# Patient Record
Sex: Female | Born: 1935 | Race: White | Hispanic: No | Smoking: Never smoker
Health system: Southern US, Community
[De-identification: ages and names within clinical notes are randomized; demographics above are authoritative.]

## PROBLEM LIST (undated history)

## (undated) DIAGNOSIS — I10 Essential (primary) hypertension: Secondary | ICD-10-CM

## (undated) DIAGNOSIS — D496 Neoplasm of unspecified behavior of brain: Secondary | ICD-10-CM

## (undated) DIAGNOSIS — R42 Dizziness and giddiness: Secondary | ICD-10-CM

## (undated) DIAGNOSIS — I2510 Atherosclerotic heart disease of native coronary artery without angina pectoris: Secondary | ICD-10-CM

## (undated) DIAGNOSIS — R112 Nausea with vomiting, unspecified: Secondary | ICD-10-CM

## (undated) DIAGNOSIS — K219 Gastro-esophageal reflux disease without esophagitis: Secondary | ICD-10-CM

## (undated) DIAGNOSIS — E785 Hyperlipidemia, unspecified: Secondary | ICD-10-CM

## (undated) DIAGNOSIS — Z87442 Personal history of urinary calculi: Secondary | ICD-10-CM

## (undated) DIAGNOSIS — M1990 Unspecified osteoarthritis, unspecified site: Secondary | ICD-10-CM

## (undated) DIAGNOSIS — Z9889 Other specified postprocedural states: Secondary | ICD-10-CM

## (undated) DIAGNOSIS — I614 Nontraumatic intracerebral hemorrhage in cerebellum: Secondary | ICD-10-CM

## (undated) DIAGNOSIS — T753XXA Motion sickness, initial encounter: Secondary | ICD-10-CM

## (undated) DIAGNOSIS — Z8673 Personal history of transient ischemic attack (TIA), and cerebral infarction without residual deficits: Secondary | ICD-10-CM

## (undated) HISTORY — DX: Essential (primary) hypertension: I10

## (undated) HISTORY — PX: ESOPHAGOGASTRODUODENOSCOPY: SHX1529

## (undated) HISTORY — PX: BRAIN SURGERY: SHX531

## (undated) HISTORY — PX: ABDOMINAL HYSTERECTOMY: SHX81

## (undated) HISTORY — DX: Personal history of urinary calculi: Z87.442

## (undated) HISTORY — PX: COLONOSCOPY: SHX174

## (undated) HISTORY — DX: Atherosclerotic heart disease of native coronary artery without angina pectoris: I25.10

## (undated) HISTORY — PX: CATARACT EXTRACTION W/ INTRAOCULAR LENS  IMPLANT, BILATERAL: SHX1307

## (undated) HISTORY — DX: Hyperlipidemia, unspecified: E78.5

## (undated) HISTORY — PX: APPENDECTOMY: SHX54

---

## 2004-12-10 ENCOUNTER — Ambulatory Visit: Payer: Self-pay | Admitting: Internal Medicine

## 2005-12-30 ENCOUNTER — Ambulatory Visit: Payer: Self-pay | Admitting: Internal Medicine

## 2006-12-29 ENCOUNTER — Emergency Department: Payer: Self-pay | Admitting: Emergency Medicine

## 2006-12-29 ENCOUNTER — Other Ambulatory Visit: Payer: Self-pay

## 2007-01-12 ENCOUNTER — Ambulatory Visit: Payer: Self-pay | Admitting: Internal Medicine

## 2008-01-17 ENCOUNTER — Ambulatory Visit: Payer: Self-pay | Admitting: Internal Medicine

## 2008-09-26 ENCOUNTER — Ambulatory Visit: Payer: Self-pay | Admitting: Ophthalmology

## 2008-10-02 ENCOUNTER — Ambulatory Visit: Payer: Self-pay | Admitting: Ophthalmology

## 2008-10-23 ENCOUNTER — Ambulatory Visit: Payer: Self-pay | Admitting: Ophthalmology

## 2009-01-23 ENCOUNTER — Ambulatory Visit: Payer: Self-pay | Admitting: Internal Medicine

## 2010-01-28 ENCOUNTER — Ambulatory Visit: Payer: Self-pay | Admitting: Internal Medicine

## 2010-08-08 ENCOUNTER — Ambulatory Visit: Payer: Self-pay | Admitting: Unknown Physician Specialty

## 2010-09-13 ENCOUNTER — Ambulatory Visit: Payer: Self-pay | Admitting: Unknown Physician Specialty

## 2011-01-02 DIAGNOSIS — Z8673 Personal history of transient ischemic attack (TIA), and cerebral infarction without residual deficits: Secondary | ICD-10-CM

## 2011-01-02 DIAGNOSIS — I614 Nontraumatic intracerebral hemorrhage in cerebellum: Secondary | ICD-10-CM

## 2011-01-02 HISTORY — DX: Nontraumatic intracerebral hemorrhage in cerebellum: I61.4

## 2011-01-02 HISTORY — DX: Personal history of transient ischemic attack (TIA), and cerebral infarction without residual deficits: Z86.73

## 2011-01-27 ENCOUNTER — Emergency Department: Payer: Self-pay | Admitting: Internal Medicine

## 2011-02-27 ENCOUNTER — Ambulatory Visit: Payer: Self-pay | Admitting: Internal Medicine

## 2011-03-04 HISTORY — PX: OTHER SURGICAL HISTORY: SHX169

## 2012-03-01 ENCOUNTER — Ambulatory Visit: Payer: Self-pay | Admitting: Internal Medicine

## 2013-03-02 ENCOUNTER — Ambulatory Visit: Payer: Self-pay | Admitting: Internal Medicine

## 2014-02-17 ENCOUNTER — Emergency Department: Payer: Self-pay | Admitting: Emergency Medicine

## 2014-02-17 LAB — COMPREHENSIVE METABOLIC PANEL
ALBUMIN: 3.7 g/dL (ref 3.4–5.0)
ANION GAP: 8 (ref 7–16)
Alkaline Phosphatase: 85 U/L
BUN: 26 mg/dL — AB (ref 7–18)
Bilirubin,Total: 0.3 mg/dL (ref 0.2–1.0)
CHLORIDE: 108 mmol/L — AB (ref 98–107)
CREATININE: 1.18 mg/dL (ref 0.60–1.30)
Calcium, Total: 8.8 mg/dL (ref 8.5–10.1)
Co2: 27 mmol/L (ref 21–32)
EGFR (Non-African Amer.): 47 — ABNORMAL LOW
GFR CALC AF AMER: 57 — AB
Glucose: 154 mg/dL — ABNORMAL HIGH (ref 65–99)
Osmolality: 293 (ref 275–301)
Potassium: 3.3 mmol/L — ABNORMAL LOW (ref 3.5–5.1)
SGOT(AST): 23 U/L (ref 15–37)
SGPT (ALT): 20 U/L
Sodium: 143 mmol/L (ref 136–145)
TOTAL PROTEIN: 7.7 g/dL (ref 6.4–8.2)

## 2014-02-17 LAB — CBC
HCT: 39.2 % (ref 35.0–47.0)
HGB: 12.9 g/dL (ref 12.0–16.0)
MCH: 30.9 pg (ref 26.0–34.0)
MCHC: 32.8 g/dL (ref 32.0–36.0)
MCV: 94 fL (ref 80–100)
PLATELETS: 270 10*3/uL (ref 150–440)
RBC: 4.17 10*6/uL (ref 3.80–5.20)
RDW: 12.8 % (ref 11.5–14.5)
WBC: 9.4 10*3/uL (ref 3.6–11.0)

## 2014-02-17 LAB — URINALYSIS, COMPLETE
BILIRUBIN, UR: NEGATIVE
Glucose,UR: 150 mg/dL (ref 0–75)
Ketone: NEGATIVE
Leukocyte Esterase: NEGATIVE
Nitrite: NEGATIVE
PH: 5 (ref 4.5–8.0)
Protein: 30
Specific Gravity: 1.024 (ref 1.003–1.030)
Squamous Epithelial: 4
WBC UR: 4 /HPF (ref 0–5)

## 2014-03-06 ENCOUNTER — Ambulatory Visit: Payer: Self-pay | Admitting: Internal Medicine

## 2014-03-13 DIAGNOSIS — N200 Calculus of kidney: Secondary | ICD-10-CM | POA: Insufficient documentation

## 2014-06-23 DIAGNOSIS — K219 Gastro-esophageal reflux disease without esophagitis: Secondary | ICD-10-CM | POA: Insufficient documentation

## 2014-09-11 ENCOUNTER — Encounter: Payer: Self-pay | Admitting: Emergency Medicine

## 2014-09-11 ENCOUNTER — Emergency Department: Payer: Medicare Other

## 2014-09-11 ENCOUNTER — Emergency Department
Admission: EM | Admit: 2014-09-11 | Discharge: 2014-09-11 | Disposition: A | Discharge status: Home / Self Care | Payer: Medicare Other | Attending: Emergency Medicine | Admitting: Emergency Medicine

## 2014-09-11 DIAGNOSIS — R42 Dizziness and giddiness: Secondary | ICD-10-CM | POA: Diagnosis not present

## 2014-09-11 DIAGNOSIS — R202 Paresthesia of skin: Secondary | ICD-10-CM | POA: Diagnosis not present

## 2014-09-11 DIAGNOSIS — R079 Chest pain, unspecified: Secondary | ICD-10-CM | POA: Insufficient documentation

## 2014-09-11 HISTORY — DX: Essential (primary) hypertension: I10

## 2014-09-11 HISTORY — DX: Neoplasm of unspecified behavior of brain: D49.6

## 2014-09-11 LAB — BASIC METABOLIC PANEL
Anion gap: 11 (ref 5–15)
BUN: 15 mg/dL (ref 6–20)
CO2: 26 mmol/L (ref 22–32)
CREATININE: 0.83 mg/dL (ref 0.44–1.00)
Calcium: 9.8 mg/dL (ref 8.9–10.3)
Chloride: 106 mmol/L (ref 101–111)
GFR calc non Af Amer: >60 mL/min (ref >60)
Glucose, Bld: 102 mg/dL — ABNORMAL HIGH (ref 65–99)
POTASSIUM: 3.5 mmol/L (ref 3.5–5.1)
Sodium: 143 mmol/L (ref 135–145)

## 2014-09-11 LAB — TROPONIN I
Troponin I: <0.03 ng/mL (ref <0.031)
Troponin I: <0.03 ng/mL (ref <0.031)

## 2014-09-11 LAB — CBC
HCT: 40.6 % (ref 35.0–47.0)
Hemoglobin: 13.5 g/dL (ref 12.0–16.0)
MCH: 30.6 pg (ref 26.0–34.0)
MCHC: 33.1 g/dL (ref 32.0–36.0)
MCV: 92.2 fL (ref 80.0–100.0)
Platelets: 263 10*3/uL (ref 150–440)
RBC: 4.41 MIL/uL (ref 3.80–5.20)
RDW: 12.9 % (ref 11.5–14.5)
WBC: 6 10*3/uL (ref 3.6–11.0)

## 2014-09-11 NOTE — ED Provider Notes (Signed)
[facility] Emergency Department Provider Note   ____________________________________________  Time seen: 0815  I have reviewed the triage vital signs and the nursing notes.   HISTORY  Chief Complaint Chest Pain and Near Syncope   History limited by: Not Limited   HPI Karen Liu is a 81 y.o. female who presents to the emergency Department with complaints of chest tightness, dizziness, shortness of breath, lightheadedness and tingling. The patient states she has been having these symptoms for the past week. They have been intermittent. She has not noticed any obvious pattern however she states they might be worse after exerting herself. She states that this morning she experienced a extremely bad episode of the chest tightness, shortness of breath and tingling. The tingling is located in her head and arms. Denies any cough or fever.   No past medical history on file.  There are no active problems to display for this patient.   No past surgical history on file.  No current outpatient prescriptions on file.  Allergies Review of patient's allergies indicates not on file.  No family history on file.  Social History History  Substance Use Topics  . Smoking status: Not on file  . Smokeless tobacco: Not on file  . Alcohol Use: Not on file    Review of Systems  Constitutional: Negative for fever. Cardiovascular: Positive for chest tightness Respiratory: Positive for shortness of breath. Gastrointestinal: Negative for abdominal pain, vomiting and diarrhea. Genitourinary: Negative for dysuria. Musculoskeletal: Negative for back pain. Skin: Negative for rash. Neurological: Positive for lightheadedness, tingling  10-point ROS otherwise negative.  ____________________________________________   PHYSICAL EXAM:  VITAL SIGNS:  98.6 F (37 C)  72  18   149/70 mmHg  97 %   Constitutional: Alert and oriented. Well appearing and in no  distress. Eyes: Conjunctivae are normal. PERRL. Normal extraocular movements. ENT   Head: Normocephalic and atraumatic.   Nose: No congestion/rhinnorhea.   Mouth/Throat: Mucous membranes are moist.   Neck: No stridor. Hematological/Lymphatic/Immunilogical: No cervical lymphadenopathy. Cardiovascular: Normal rate, regular rhythm.  No murmurs, rubs, or gallops. Respiratory: Normal respiratory effort without tachypnea nor retractions. Breath sounds are clear and equal bilaterally. No wheezes/rales/rhonchi. Gastrointestinal: Soft and nontender. No distention. There is no CVA tenderness. Genitourinary: Deferred Musculoskeletal: Normal range of motion in all extremities. No joint effusions.  No lower extremity tenderness nor edema. Neurologic:  Normal speech and language. No gross focal neurologic deficits are appreciated. Speech is normal.  Skin:  Skin is warm, dry and intact. No rash noted. Psychiatric: Mood and affect are normal. Speech and behavior are normal. Patient exhibits appropriate insight and judgment.  ____________________________________________    LABS (pertinent positives/negatives)  Labs Reviewed  BASIC METABOLIC PANEL - Abnormal; Notable for the following:    Glucose, Bld 102 (*)    All other components within normal limits  TROPONIN I  CBC     ____________________________________________   EKG  I, Nance Pear, attending physician, personally viewed and interpreted this EKG  EKG Time: 0804 Rate: 74 Rhythm: normal sinus rhythm Axis: normal Intervals: normal QRS: normal ST changes: no st elevation  ____________________________________________    RADIOLOGY  CXR IMPRESSION: Mild hyperinflation may be voluntary or may reflect underlying reactive airway disease or COPD. There is no active cardiopulmonary disease.  CT Head IMPRESSION: Status post left occipital craniotomy.  Mild chronic atrophic and ischemic changes without  acute abnormality. ____________________________________________   PROCEDURES  Procedure(s) performed: None  Critical Care performed: No  ____________________________________________  INITIAL IMPRESSION / ASSESSMENT AND PLAN / ED COURSE  Pertinent labs & imaging results that were available during my care of the patient were reviewed by me and considered in my medical decision making (see chart for details).  Patient presented to the emergency Department with complaints of chest tightness, dizziness shortness of breath and tingling. CT head chest x-ray without concerning findings. Additionally blood work including 2 sets of troponin were negative. At this point had discussion with patient, felt the patient was safe for discharge home. Will discharge home with cardiology follow-up.   ____________________________________________   FINAL CLINICAL IMPRESSION(S) / ED DIAGNOSES  Final diagnoses:  Chest pain, unspecified chest pain type  Dizziness     Nance Pear, MD 09/11/14 1255

## 2014-09-11 NOTE — ED Notes (Signed)
Pt to ED via EMS transport from home with c/o chest tightness, dizziness, sob and lightheadedness, pt states she has had intermittent chest tightness for about 1 week and then yesterday she had dizziness and near syncopal episode, woke up this am with chest tightness and dizziness still present

## 2014-09-11 NOTE — Discharge Instructions (Signed)
Please seek medical attention for any high fevers, chest pain, shortness of breath, change in behavior, persistent vomiting, bloody stool or any other new or concerning symptoms.  Chest Pain (Nonspecific) It is often hard to give a specific diagnosis for the cause of chest pain. There is always a chance that your pain could be related to something serious, such as a heart attack or a blood clot in the lungs. You need to follow up with your health care provider for further evaluation. CAUSES   Heartburn.  Pneumonia or bronchitis.  Anxiety or stress.  Inflammation around your heart (pericarditis) or lung (pleuritis or pleurisy).  A blood clot in the lung.  A collapsed lung (pneumothorax). It can develop suddenly on its own (spontaneous pneumothorax) or from trauma to the chest.  Shingles infection (herpes zoster virus). The chest wall is composed of bones, muscles, and cartilage. Any of these can be the source of the pain.  The bones can be bruised by injury.  The muscles or cartilage can be strained by coughing or overwork.  The cartilage can be affected by inflammation and become sore (costochondritis). DIAGNOSIS  Lab tests or other studies may be needed to find the cause of your pain. Your health care provider may have you take a test called an ambulatory electrocardiogram (ECG). An ECG records your heartbeat patterns over a 24-hour period. You may also have other tests, such as:  Transthoracic echocardiogram (TTE). During echocardiography, sound waves are used to evaluate how blood flows through your heart.  Transesophageal echocardiogram (TEE).  Cardiac monitoring. This allows your health care provider to monitor your heart rate and rhythm in real time.  Holter monitor. This is a portable device that records your heartbeat and can help diagnose heart arrhythmias. It allows your health care provider to track your heart activity for several days, if needed.  Stress tests by  exercise or by giving medicine that makes the heart beat faster. TREATMENT   Treatment depends on what may be causing your chest pain. Treatment may include:  Acid blockers for heartburn.  Anti-inflammatory medicine.  Pain medicine for inflammatory conditions.  Antibiotics if an infection is present.  You may be advised to change lifestyle habits. This includes stopping smoking and avoiding alcohol, caffeine, and chocolate.  You may be advised to keep your head raised (elevated) when sleeping. This reduces the chance of acid going backward from your stomach into your esophagus. Most of the time, nonspecific chest pain will improve within 2-3 days with rest and mild pain medicine.  HOME CARE INSTRUCTIONS   If antibiotics were prescribed, take them as directed. Finish them even if you start to feel better.  For the next few days, avoid physical activities that bring on chest pain. Continue physical activities as directed.  Do not use any tobacco products, including cigarettes, chewing tobacco, or electronic cigarettes.  Avoid drinking alcohol.  Only take medicine as directed by your health care provider.  Follow your health care provider's suggestions for further testing if your chest pain does not go away.  Keep any follow-up appointments you made. If you do not go to an appointment, you could develop lasting (chronic) problems with pain. If there is any problem keeping an appointment, call to reschedule. SEEK MEDICAL CARE IF:   Your chest pain does not go away, even after treatment.  You have a rash with blisters on your chest.  You have a fever. SEEK IMMEDIATE MEDICAL CARE IF:   You have increased chest  pain or pain that spreads to your arm, neck, jaw, back, or abdomen.  You have shortness of breath.  You have an increasing cough, or you cough up blood.  You have severe back or abdominal pain.  You feel nauseous or vomit.  You have severe weakness.  You  faint.  You have chills. This is an emergency. Do not wait to see if the pain will go away. Get medical help at once. Call your local emergency services (911 in [place] Do not drive yourself to the hospital. MAKE SURE YOU:   Understand these instructions.  Will watch your condition.  Will get help right away if you are not doing well or get worse. Document Released: 11/27/2004 Document Revised: 02/22/2013 Document Reviewed: 09/23/2007 [facility] Patient Information 2015 [place], [place]. This information is not intended to replace advice given to you by your health care provider. Make sure you discuss any questions you have with your health care provider.  Dizziness Dizziness is a common problem. It is a feeling of unsteadiness or light-headedness. You may feel like you are about to faint. Dizziness can lead to injury if you stumble or fall. A person of any age group can suffer from dizziness, but dizziness is more common in older adults. CAUSES  Dizziness can be caused by many different things, including:  Middle ear problems.  Standing for too long.  Infections.  An allergic reaction.  Aging.  An emotional response to something, such as the sight of blood.  Side effects of medicines.  Tiredness.  Problems with circulation or blood pressure.  Excessive use of alcohol or medicines, or illegal drug use.  Breathing too fast (hyperventilation).  An irregular heart rhythm (arrhythmia).  A low red blood cell count (anemia).  Pregnancy.  Vomiting, diarrhea, fever, or other illnesses that cause body fluid loss (dehydration).  Diseases or conditions such as Parkinson's disease, high blood pressure (hypertension), diabetes, and thyroid problems.  Exposure to extreme heat. DIAGNOSIS  Your health care provider will ask about your symptoms, perform a physical exam, and perform an electrocardiogram (ECG) to record the electrical activity of your heart. Your health care provider  may also perform other heart or blood tests to determine the cause of your dizziness. These may include:  Transthoracic echocardiogram (TTE). During echocardiography, sound waves are used to evaluate how blood flows through your heart.  Transesophageal echocardiogram (TEE).  Cardiac monitoring. This allows your health care provider to monitor your heart rate and rhythm in real time.  Holter monitor. This is a portable device that records your heartbeat and can help diagnose heart arrhythmias. It allows your health care provider to track your heart activity for several days if needed.  Stress tests by exercise or by giving medicine that makes the heart beat faster. TREATMENT  Treatment of dizziness depends on the cause of your symptoms and can vary greatly. HOME CARE INSTRUCTIONS   Drink enough fluids to keep your urine clear or pale yellow. This is especially important in very hot weather. In older adults, it is also important in cold weather.  Take your medicine exactly as directed if your dizziness is caused by medicines. When taking blood pressure medicines, it is especially important to get up slowly.  Rise slowly from chairs and steady yourself until you feel okay.  In the morning, first sit up on the side of the bed. When you feel okay, stand slowly while holding onto something until you know your balance is fine.  Move your legs  often if you need to stand in one place for a long time. Tighten and relax your muscles in your legs while standing.  Have someone stay with you for 1-2 days if dizziness continues to be a problem. Do this until you feel you are well enough to stay alone. Have the person call your health care provider if he or she notices changes in you that are concerning.  Do not drive or use heavy machinery if you feel dizzy.  Do not drink alcohol. SEEK IMMEDIATE MEDICAL CARE IF:   Your dizziness or light-headedness gets worse.  You feel nauseous or vomit.  You  have problems talking, walking, or using your arms, hands, or legs.  You feel weak.  You are not thinking clearly or you have trouble forming sentences. It may take a friend or family member to notice this.  You have chest pain, abdominal pain, shortness of breath, or sweating.  Your vision changes.  You notice any bleeding.  You have side effects from medicine that seems to be getting worse rather than better. MAKE SURE YOU:   Understand these instructions.  Will watch your condition.  Will get help right away if you are not doing well or get worse. Document Released: 08/13/2000 Document Revised: 02/22/2013 Document Reviewed: 09/06/2010 [facility] Patient Information 2015 [place], [place]. This information is not intended to replace advice given to you by your health care provider. Make sure you discuss any questions you have with your health care provider.

## 2014-09-11 NOTE — ED Notes (Signed)
Pt reports taking 4 baby asa PTA

## 2014-09-14 ENCOUNTER — Ambulatory Visit (INDEPENDENT_AMBULATORY_CARE_PROVIDER_SITE_OTHER): Payer: Medicare Other | Admitting: Cardiovascular Disease

## 2014-09-14 ENCOUNTER — Encounter: Payer: Self-pay | Admitting: Cardiovascular Disease

## 2014-09-14 ENCOUNTER — Ambulatory Visit: Payer: Medicare Other | Admitting: Cardiovascular Disease

## 2014-09-14 VITALS — BP 140/60 | HR 79 | Ht 66.0 in | Wt 135.0 lb

## 2014-09-14 DIAGNOSIS — I10 Essential (primary) hypertension: Secondary | ICD-10-CM | POA: Diagnosis not present

## 2014-09-14 DIAGNOSIS — R0789 Other chest pain: Secondary | ICD-10-CM | POA: Insufficient documentation

## 2014-09-14 DIAGNOSIS — R0602 Shortness of breath: Secondary | ICD-10-CM

## 2014-09-14 DIAGNOSIS — E785 Hyperlipidemia, unspecified: Secondary | ICD-10-CM | POA: Insufficient documentation

## 2014-09-14 NOTE — Patient Instructions (Addendum)
Medication Instructions:  Your physician recommends that you continue on your current medications as directed. Please refer to the Current Medication list given to you today.   Labwork: none  Testing/Procedures: Your physician has requested that you have a lexiscan myoview.  [place]  Your caregiver has ordered a Stress Test with nuclear imaging. The purpose of this test is to evaluate the blood supply to your heart muscle. This procedure is referred to as a "Non-Invasive Stress Test." This is because other than having an IV started in your vein, nothing is inserted or "invades" your body. Cardiac stress tests are done to find areas of poor blood flow to the heart by determining the extent of coronary artery disease (CAD). Some patients exercise on a treadmill, which naturally increases the blood flow to your heart, while others who are  unable to walk on a treadmill due to physical limitations have a pharmacologic/chemical stress agent called Lexiscan . This medicine will mimic walking on a treadmill by temporarily increasing your coronary blood flow.   Please note: these test may take anywhere between 2-4 hours to complete  PLEASE REPORT TO [facility] WILL DIRECT YOU WHERE TO GO  Date of Procedure: Friday, July 15, 8:30am Arrival Time for Procedure: 8:00am  Instructions regarding medication: You may take your morning medications with a sip of water or wait until after your test to take them.   _ PLEASE NOTIFY THE OFFICE AT LEAST 61 HOURS IN ADVANCE IF YOU ARE UNABLE TO KEEP YOUR APPOINTMENT.  [phone] AND  PLEASE NOTIFY NUCLEAR MEDICINE AT [facility] AT LEAST 24 HOURS IN ADVANCE IF YOU ARE UNABLE TO KEEP YOUR APPOINTMENT. [phone]  How to prepare for your Myoview test:   Do not eat or drink after midnight  No caffeine for 24 hours prior to test  No smoking 24 hours prior to test.  Your medication may be taken with water.   If your doctor stopped a medication because of this test, do not take that medication.  Ladies, please do not wear dresses.  Skirts or pants are appropriate. Please wear a short sleeve shirt.  No perfume, cologne or lotion.  Wear comfortable walking shoes. No heels!              Follow-Up: Your physician recommends that you schedule a follow-up appointment in: 2-3 weeks with Dr. Fletcher Anon.    Any Other Special Instructions Will Be Listed Below (If Applicable).  Nuclear Medicine Exam A nuclear medicine exam is a safe and painless imaging test. It helps to detect and diagnose disease in the body as well as provide information about organ function and structure.  Nuclear scans are most often done of the:  Lungs.  Heart.  Thyroid gland.  Bones.  Abdomen. HOW A NUCLEAR MEDICINE EXAM WORKS A nuclear medicine exam works by using a radioactive tracer. The material is given either by an IV (intravenous) injection or it may be swallowed. After the tracer is in the body, it is absorbed by your body's organs. A large scanning machine that uses a special camera detects the radioactivity in your body. A computerized image is then formed regarding the area of concern. The small amounts of radioactive material used in a nuclear medicine exam are found to be medically safe. However, because radioactive material is used, this test is not done if you are pregnant or nursing.  BEFORE THE PROCEDURE  If available, bring previous imaging studies  such as x-rays, etc. with you to the exam.  Arrive early for your exam. PROCEDURE  An IV may be started before the exam begins.  Depending on the type of examination, will lie on a table or sit in a chair during the exam.  The nuclear medicine exam will take about 30 to 60 minutes to complete. AFTER THE PROCEDURE  After your scan is completed, the image(s) will be evaluated by a specialist. It is important that you follow up with your caregiver to  find out your test results.  You may return to your regular activity as instructed by your caregiver. SEEK IMMEDIATE MEDICAL CARE IF: You have shortness of breath or difficulty breathing. MAKE SURE YOU:   Understand these instructions.  Will watch your condition.  Will get help right away if you are not doing well or get worse. Document Released: 03/27/2004 Document Revised: 05/12/2011 Document Reviewed: 05/11/2008 [facility] Patient Information 2015 [place], [place]. This information is not intended to replace advice given to you by your health care provider. Make sure you discuss any questions you have with your health care provider. Nuclear Medicine Exam A nuclear medicine exam is a safe and painless imaging test. It helps to detect and diagnose disease in the body as well as provide information about organ function and structure.  Nuclear scans are most often done of the:  Lungs.  Heart.  Thyroid gland.  Bones.  Abdomen. HOW A NUCLEAR MEDICINE EXAM WORKS A nuclear medicine exam works by using a radioactive tracer. The material is given either by an IV (intravenous) injection or it may be swallowed. After the tracer is in the body, it is absorbed by your body's organs. A large scanning machine that uses a special camera detects the radioactivity in your body. A computerized image is then formed regarding the area of concern. The small amounts of radioactive material used in a nuclear medicine exam are found to be medically safe. However, because radioactive material is used, this test is not done if you are pregnant or nursing.  BEFORE THE PROCEDURE  If available, bring previous imaging studies such as x-rays, etc. with you to the exam.  Arrive early for your exam. PROCEDURE  An IV may be started before the exam begins.  Depending on the type of examination, will lie on a table or sit in a chair during the exam.  The nuclear medicine exam will take about 30 to 60 minutes to  complete. AFTER THE PROCEDURE  After your scan is completed, the image(s) will be evaluated by a specialist. It is important that you follow up with your caregiver to find out your test results.  You may return to your regular activity as instructed by your caregiver. SEEK IMMEDIATE MEDICAL CARE IF: You have shortness of breath or difficulty breathing. MAKE SURE YOU:   Understand these instructions.  Will watch your condition.  Will get help right away if you are not doing well or get worse. Document Released: 03/27/2004 Document Revised: 05/12/2011 Document Reviewed: 05/11/2008 [facility] Patient Information 2015 [place], [place]. This information is not intended to replace advice given to you by your health care provider. Make sure you discuss any questions you have with your health care provider.

## 2014-09-14 NOTE — Assessment & Plan Note (Signed)
The patient's symptoms are worrisome for new onset class III angina which has been stable since May. She has multiple risk factors for coronary artery disease and thus I requested evaluation with a pharmacologic nuclear stress test. She is not able to exercise on a treadmill. If stress test is low risk, I will consider adding a small dose beta blocker . Otherwise, she might require cardiac catheterization.

## 2014-09-14 NOTE — Assessment & Plan Note (Signed)
Blood pressure is controlled on amlodipine. 

## 2014-09-14 NOTE — Progress Notes (Addendum)
Primary care physician: Dr. Lisette Grinder  HPI  This is a pleasant 81 year old female who was referred from the emergency room at [facility] for evaluation of chest pain and shortness of breath. She has no previous cardiac history. 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 She is not a smoker. Her mother had myocardial infarction in her early 56s. She had a trip to [place] and may and during that time, she noticed symptoms of exertional dyspnea and chest tightness which was something new to her. Discontinued since then but she hasn't had to do as much walking. This past Sunday she was in church she felt dizzy and lightheaded with associated palpitations. Her heart rate was 99 bpm which is somewhat unusual for her. She went to the emergency room at [facility]. CT scan of the head was negative. The rest of the workup was unremarkable.   Allergies  Allergen Reactions  . Other Rash  . Phenazopyridine Rash  . Pyridium [Phenazopyridine Hcl] Rash  . Sulfa Antibiotics Rash    Other reaction(s): HIVES  . Tamsulosin Nausea And Vomiting     Current Outpatient Prescriptions on File Prior to Visit  Medication Sig Dispense Refill  . amLODipine (NORVASC) 2.5 MG tablet Take 1 tablet by mouth every morning.    Marland Kitchen aspirin EC 81 MG tablet Take 1 tablet by mouth every morning.    . calcium carbonate (CALCIUM 600) 600 MG TABS tablet Take 1 tablet by mouth every morning.    . cyanocobalamin (V-R VITAMIN B-12) 500 MCG tablet Take 1 tablet by mouth every morning.    Marland Kitchen omeprazole (PRILOSEC) 20 MG capsule Take 1 capsule by mouth every morning.    . potassium citrate (UROCIT-K) 10 MEQ (1080 MG) SR tablet Take 1 tablet by mouth 2 (two) times daily.    . pravastatin (PRAVACHOL) 40 MG tablet Take 1 tablet by mouth every evening.     No current facility-administered medications on file prior to visit.     Past Medical History  Diagnosis Date  . Brain tumor   .  Hypertension   . History of kidney stones   . Essential hypertension   . Hyperlipidemia      Past Surgical History  Procedure Laterality Date  . Brain surgery  brain tumor removed 2013     Family History  Problem Relation Age of Onset  . Heart attack Mother 36  . Heart disease Mother   . Hypertension Mother   . Hyperlipidemia Mother      History   Social History  . Marital Status: Married    Spouse Name: N/A  . Number of Children: N/A  . Years of Education: N/A   Occupational History  . Not on file.   Social History Main Topics  . Smoking status: Never Smoker   . Smokeless tobacco: Not on file  . Alcohol Use: No  . Drug Use: No  . Sexual Activity: Not on file   Other Topics Concern  . Not on file   Social History Narrative     ROS A 10 point review of system was performed. It is negative other than that mentioned in the history of present illness.   PHYSICAL EXAM   BP 140/60 mmHg  Pulse 79  Ht 5\' 6"  (1.676 m)  Wt 135 lb (61.236 kg)  BMI 21.80 kg/m2 Constitutional: She is oriented to person, place, and time. She appears well-developed and well-nourished. No distress.  HENT: No nasal  discharge.  Head: Normocephalic and atraumatic.  Eyes: Pupils are equal and round. No discharge.  Neck: Normal range of motion. Neck supple. No JVD present. No thyromegaly present.  Cardiovascular: Normal rate, regular rhythm, normal heart sounds. Exam reveals no gallop and no friction rub. No murmur heard.  Pulmonary/Chest: Effort normal and breath sounds normal. No stridor. No respiratory distress. She has no wheezes. She has no rales. She exhibits no tenderness.  Abdominal: Soft. Bowel sounds are normal. She exhibits no distension. There is no tenderness. There is no rebound and no guarding.  Musculoskeletal: Normal range of motion. She exhibits no edema and no tenderness.  Neurological: She is alert and oriented to person, place, and time. Coordination normal.  Skin:  Skin is warm and dry. No rash noted. She is not diaphoretic. No erythema. No pallor.  Psychiatric: She has a normal mood and affect. Her behavior is normal. Judgment and thought content normal.     HDT:PNSQZ  Rhythm  WITHIN NORMAL LIMITS   ASSESSMENT AND PLAN

## 2014-09-15 ENCOUNTER — Other Ambulatory Visit: Payer: Self-pay | Admitting: Physician Assistant

## 2014-09-15 ENCOUNTER — Encounter
Admission: RE | Admit: 2014-09-15 | Discharge: 2014-09-15 | Disposition: A | Discharge status: Home / Self Care | Payer: Medicare Other | Source: Ambulatory Visit | Attending: Cardiovascular Disease | Admitting: Cardiovascular Disease

## 2014-09-15 ENCOUNTER — Other Ambulatory Visit: Payer: Self-pay

## 2014-09-15 DIAGNOSIS — R0789 Other chest pain: Secondary | ICD-10-CM

## 2014-09-15 DIAGNOSIS — R079 Chest pain, unspecified: Secondary | ICD-10-CM

## 2014-09-15 DIAGNOSIS — Z01812 Encounter for preprocedural laboratory examination: Secondary | ICD-10-CM

## 2014-09-15 LAB — NM MYOCAR MULTI W/SPECT W/WALL MOTION / EF
CHL CUP NUCLEAR SDS: 0
CHL CUP NUCLEAR SSS: 0
CSEPHR: 87 %
LV sys vol: 11 mL
LVDIAVOL: 50 mL
Peak HR: 121 {beats}/min
Rest HR: 64 {beats}/min
SRS: 1
TID: 0.86

## 2014-09-15 MED ORDER — REGADENOSON 0.4 MG/5ML IV SOLN
0.4000 mg | Freq: Once | INTRAVENOUS | Status: AC
  Administered 2014-09-15: 0.4 mg via INTRAVENOUS

## 2014-09-15 MED ORDER — SODIUM CHLORIDE 0.9 % IJ SOLN: 80.0000 mg | INTRAVENOUS | Status: AC

## 2014-09-15 MED ORDER — TECHNETIUM TC 99M SESTAMIBI - CARDIOLITE
31.5350 | Freq: Once | INTRAVENOUS | Status: AC | PRN
  Administered 2014-09-15: 09:00:00 31.535 via INTRAVENOUS

## 2014-09-15 MED ORDER — TECHNETIUM TC 99M SESTAMIBI - CARDIOLITE
13.6300 | Freq: Once | INTRAVENOUS | Status: AC | PRN
  Administered 2014-09-15: 08:00:00 13.63 via INTRAVENOUS

## 2014-09-18 ENCOUNTER — Other Ambulatory Visit
Admission: RE | Admit: 2014-09-18 | Discharge: 2014-09-18 | Disposition: A | Discharge status: Home / Self Care | Payer: Medicare Other | Source: Ambulatory Visit | Attending: Cardiovascular Disease | Admitting: Cardiovascular Disease

## 2014-09-18 DIAGNOSIS — Z01812 Encounter for preprocedural laboratory examination: Secondary | ICD-10-CM | POA: Diagnosis present

## 2014-09-18 DIAGNOSIS — Z8249 Family history of ischemic heart disease and other diseases of the circulatory system: Secondary | ICD-10-CM | POA: Insufficient documentation

## 2014-09-18 DIAGNOSIS — E785 Hyperlipidemia, unspecified: Secondary | ICD-10-CM | POA: Diagnosis not present

## 2014-09-18 DIAGNOSIS — I10 Essential (primary) hypertension: Secondary | ICD-10-CM | POA: Insufficient documentation

## 2014-09-18 LAB — PROTIME-INR
INR: 1.06
PROTHROMBIN TIME: 14 s (ref 11.4–15.0)

## 2014-09-21 ENCOUNTER — Ambulatory Visit
Admission: RE | Admit: 2014-09-21 | Discharge: 2014-09-21 | Disposition: A | Discharge status: Home / Self Care | Payer: Medicare Other | Source: Ambulatory Visit | Attending: Cardiovascular Disease | Admitting: Cardiovascular Disease

## 2014-09-21 ENCOUNTER — Encounter: Payer: Self-pay | Admitting: *Deleted

## 2014-09-21 ENCOUNTER — Encounter
Admission: RE | Disposition: A | Discharge status: Home / Self Care | Payer: Self-pay | Source: Ambulatory Visit | Attending: Cardiovascular Disease

## 2014-09-21 DIAGNOSIS — Z7982 Long term (current) use of aspirin: Secondary | ICD-10-CM | POA: Diagnosis not present

## 2014-09-21 DIAGNOSIS — Z87442 Personal history of urinary calculi: Secondary | ICD-10-CM | POA: Diagnosis not present

## 2014-09-21 DIAGNOSIS — E785 Hyperlipidemia, unspecified: Secondary | ICD-10-CM | POA: Insufficient documentation

## 2014-09-21 DIAGNOSIS — Z882 Allergy status to sulfonamides status: Secondary | ICD-10-CM | POA: Insufficient documentation

## 2014-09-21 DIAGNOSIS — I208 Other forms of angina pectoris: Secondary | ICD-10-CM | POA: Diagnosis not present

## 2014-09-21 DIAGNOSIS — I25119 Atherosclerotic heart disease of native coronary artery with unspecified angina pectoris: Secondary | ICD-10-CM | POA: Insufficient documentation

## 2014-09-21 DIAGNOSIS — Z888 Allergy status to other drugs, medicaments and biological substances status: Secondary | ICD-10-CM | POA: Insufficient documentation

## 2014-09-21 DIAGNOSIS — I25118 Atherosclerotic heart disease of native coronary artery with other forms of angina pectoris: Secondary | ICD-10-CM | POA: Diagnosis not present

## 2014-09-21 DIAGNOSIS — I10 Essential (primary) hypertension: Secondary | ICD-10-CM | POA: Insufficient documentation

## 2014-09-21 DIAGNOSIS — K219 Gastro-esophageal reflux disease without esophagitis: Secondary | ICD-10-CM | POA: Diagnosis not present

## 2014-09-21 DIAGNOSIS — R9439 Abnormal result of other cardiovascular function study: Secondary | ICD-10-CM | POA: Diagnosis present

## 2014-09-21 DIAGNOSIS — Z8249 Family history of ischemic heart disease and other diseases of the circulatory system: Secondary | ICD-10-CM | POA: Diagnosis not present

## 2014-09-21 DIAGNOSIS — Z79899 Other long term (current) drug therapy: Secondary | ICD-10-CM | POA: Insufficient documentation

## 2014-09-21 DIAGNOSIS — Z8679 Personal history of other diseases of the circulatory system: Secondary | ICD-10-CM | POA: Diagnosis not present

## 2014-09-21 HISTORY — PX: CARDIAC CATHETERIZATION: SHX172

## 2014-09-21 SURGERY — LEFT HEART CATH AND CORONARY ANGIOGRAPHY
Anesthesia: Moderate Sedation

## 2014-09-21 MED ORDER — MIDAZOLAM HCL 2 MG/2ML IJ SOLN
INTRAMUSCULAR | Status: DC | PRN
  Administered 2014-09-21: 1 mg via INTRAVENOUS
  Administered 2014-09-21: 0.5 mg via INTRAVENOUS

## 2014-09-21 MED ORDER — SODIUM CHLORIDE 0.9 % WEIGHT BASED INFUSION: 3.0000 mL/kg/h | INTRAVENOUS | Status: DC

## 2014-09-21 MED ORDER — VERAPAMIL HCL 2.5 MG/ML IV SOLN
INTRAVENOUS | Status: DC | PRN
  Administered 2014-09-21: 2.5 mg via INTRA_ARTERIAL

## 2014-09-21 MED ORDER — ADENOSINE (DIAGNOSTIC) 140MCG/KG/MIN
INTRAVENOUS | Status: DC | PRN
  Administered 2014-09-21: 140 ug/kg/min via INTRAVENOUS

## 2014-09-21 MED ORDER — ASPIRIN 81 MG PO CHEW: 81.0000 mg | CHEWABLE_TABLET | ORAL | Status: DC

## 2014-09-21 MED ORDER — VERAPAMIL HCL 2.5 MG/ML IV SOLN
INTRAVENOUS | Status: AC
  Filled 2014-09-21: qty 2

## 2014-09-21 MED ORDER — FENTANYL CITRATE (PF) 100 MCG/2ML IJ SOLN
INTRAMUSCULAR | Status: DC | PRN
  Administered 2014-09-21: 50 ug via INTRAVENOUS
  Administered 2014-09-21: 25 ug via INTRAVENOUS

## 2014-09-21 MED ORDER — SODIUM CHLORIDE 0.9 % IJ SOLN: 3.0000 mL | INTRAMUSCULAR | Status: DC | PRN

## 2014-09-21 MED ORDER — LIDOCAINE HCL (PF) 1 % IJ SOLN
INTRAMUSCULAR | Status: DC | PRN
  Administered 2014-09-21: 5 mL via INTRADERMAL

## 2014-09-21 MED ORDER — FENTANYL CITRATE (PF) 100 MCG/2ML IJ SOLN
INTRAMUSCULAR | Status: AC
Start: 2014-09-21 — End: 2014-09-21
  Filled 2014-09-21: qty 2

## 2014-09-21 MED ORDER — SODIUM CHLORIDE 0.9 % IJ SOLN: 3.0000 mL | Freq: Two times a day (BID) | INTRAMUSCULAR | Status: DC

## 2014-09-21 MED ORDER — ADENOSINE (DIAGNOSTIC) 3 MG/ML IV SOLN
INTRAVENOUS | Status: AC
  Filled 2014-09-21: qty 30

## 2014-09-21 MED ORDER — SODIUM CHLORIDE 0.9 % WEIGHT BASED INFUSION
3.0000 mL/kg/h | INTRAVENOUS | Status: DC
  Administered 2014-09-21: 3 mL/kg/h via INTRAVENOUS

## 2014-09-21 MED ORDER — SODIUM CHLORIDE 0.9 % IV SOLN: 250.0000 mL | INTRAVENOUS | Status: DC | PRN

## 2014-09-21 MED ORDER — SODIUM CHLORIDE 0.9 % WEIGHT BASED INFUSION: 1.0000 mL/kg/h | INTRAVENOUS | Status: DC

## 2014-09-21 MED ORDER — MIDAZOLAM HCL 2 MG/2ML IJ SOLN
INTRAMUSCULAR | Status: AC
  Filled 2014-09-21: qty 2

## 2014-09-21 MED ORDER — IOHEXOL 300 MG/ML  SOLN
INTRAMUSCULAR | Status: DC | PRN
  Administered 2014-09-21: 75 mL via INTRA_ARTERIAL
  Administered 2014-09-21: 20 mL via INTRA_ARTERIAL

## 2014-09-21 MED ORDER — NITROGLYCERIN 5 MG/ML IV SOLN
INTRAVENOUS | Status: AC
  Filled 2014-09-21: qty 10

## 2014-09-21 MED ORDER — HEPARIN SODIUM (PORCINE) 1000 UNIT/ML IJ SOLN
INTRAMUSCULAR | Status: AC
  Filled 2014-09-21: qty 1

## 2014-09-21 MED ORDER — HEPARIN SODIUM (PORCINE) 1000 UNIT/ML IJ SOLN
INTRAMUSCULAR | Status: DC | PRN
Start: 2014-09-21 — End: 2014-09-21
  Administered 2014-09-21: 3000 [IU] via INTRAVENOUS

## 2014-09-21 SURGICAL SUPPLY — 10 items
CATH OPTITORQUE JACKY 4.0 5F (CATHETERS) ×3 IMPLANT
CATH VISTA GUIDE 6FR JL3.5 (CATHETERS) ×3 IMPLANT
DEVICE INFLAT 30 PLUS (MISCELLANEOUS) ×3 IMPLANT
DEVICE RAD TR BAND REGULAR (VASCULAR PRODUCTS) ×3 IMPLANT
GLIDESHEATH SLEND SS 6F .021 (SHEATH) ×3 IMPLANT
KIT MANI 3VAL PERCEP (MISCELLANEOUS) ×3 IMPLANT
PACK CARDIAC CATH (CUSTOM PROCEDURE TRAY) ×3 IMPLANT
WIRE HITORQ VERSACORE ST 145CM (WIRE) ×3 IMPLANT
WIRE PRESSURE VERRATA (WIRE) ×3 IMPLANT
WIRE SAFE-T 1.5MM-J .035X260CM (WIRE) ×3 IMPLANT

## 2014-09-21 NOTE — Interval H&P Note (Signed)
History and Physical Interval Note:  09/21/2014 10:19 AM  Karen Liu  has presented today for surgery, with the diagnosis of Shortness of breath  The various methods of treatment have been discussed with the patient and family. After consideration of risks, benefits and other options for treatment, the patient has consented to  Procedure(s): Left Heart Cath (N/A) as a surgical intervention .  The patient's history has been reviewed, patient examined, no change in status, stable for surgery.  I have reviewed the patient's chart and labs.  Questions were answered to the patient's satisfaction.     Kathlyn Sacramento

## 2014-09-21 NOTE — Discharge Instructions (Signed)
Angiogram, Care After Refer to this sheet in the next few weeks. These instructions provide you with information on caring for yourself after your procedure. Your health care provider may also give you more specific instructions. Your treatment has been planned according to current medical practices, but problems sometimes occur. Call your health care provider if you have any problems or questions after your procedure.  WHAT TO EXPECT AFTER THE PROCEDURE After your procedure, it is typical to have the following sensations:  Minor discomfort or tenderness and a small bump at the catheter insertion site. The bump should usually decrease in size and tenderness within 1 to 2 weeks.  Any bruising will usually fade within 2 to 4 weeks.   HOME CARE INSTRUCTIONS   You may need to keep taking blood thinners if they were prescribed for you. Take medicines only as directed by your health care provider.  Do not apply powder or lotion to the site.  Do not take baths, swim, or use a hot tub until your health care provider approves.  You may shower 24 hours after the procedure. Remove the bandage (dressing) and gently wash the site with plain soap and water. Gently pat the site dry.  Inspect the site at least twice daily.  Limit your activity for the first 48 hours. Do not bend, squat, or lift anything over 20 lb (9 kg) or as directed by your health care provider.  Plan to have someone take you home after the procedure. Follow instructions about when you can drive or return to work. SEEK MEDICAL CARE IF:  You get light-headed when standing up.  You have drainage (other than a small amount of blood on the dressing).  You have chills.  You have a fever.  You have redness, warmth, swelling, or pain at the insertion site. SEEK IMMEDIATE MEDICAL CARE IF:   You develop chest pain or shortness of breath, feel faint, or pass out.  You have bleeding, swelling larger than a walnut, or drainage from the  catheter insertion site.  You develop pain, discoloration, coldness, or severe bruising in the leg or arm that held the catheter.  You develop bleeding from any other place, such as the bowels. You may see bright red blood in your urine or stools, or your stools may appear black and tarry.  You have heavy bleeding from the site. If this happens, hold pressure on the site. MAKE SURE YOU:  Understand these instructions.  Will watch your condition.  Will get help right away if you are not doing well or get worse. Document Released: 09/05/2004 Document Revised: 07/04/2013 Document Reviewed: 07/12/2012 [facility] Patient Information 2015 [place], [place]. This information is not intended to replace advice given to you by your health care provider. Make sure you discuss any questions you have with your health care provider. Marland Kitchenarm

## 2014-09-21 NOTE — Progress Notes (Signed)
Pt doing well post heart cath, tr band off with no bleeding at right radial site. Daughter present, taking' po's without difficulty, discharge instructions given with questions answered, ready for discharge.

## 2014-09-21 NOTE — H&P (View-Only) (Signed)
Primary care physician: Dr. Lisette Grinder  HPI  This is a pleasant 81 year old female who was referred from the emergency room at [facility] for evaluation of chest pain and shortness of breath. She has no previous cardiac history. 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 She is not a smoker. Her mother had myocardial infarction in her early 67s. She had a trip to [place] and may and during that time, she noticed symptoms of exertional dyspnea and chest tightness which was something new to her. Discontinued since then but she hasn't had to do as much walking. This past Sunday she was in church she felt dizzy and lightheaded with associated palpitations. Her heart rate was 99 bpm which is somewhat unusual for her. She went to the emergency room at [facility]. CT scan of the head was negative. The rest of the workup was unremarkable.   Allergies  Allergen Reactions  . Other Rash  . Phenazopyridine Rash  . Pyridium [Phenazopyridine Hcl] Rash  . Sulfa Antibiotics Rash    Other reaction(s): HIVES  . Tamsulosin Nausea And Vomiting     Current Outpatient Prescriptions on File Prior to Visit  Medication Sig Dispense Refill  . amLODipine (NORVASC) 2.5 MG tablet Take 1 tablet by mouth every morning.    Marland Kitchen aspirin EC 81 MG tablet Take 1 tablet by mouth every morning.    . calcium carbonate (CALCIUM 600) 600 MG TABS tablet Take 1 tablet by mouth every morning.    . cyanocobalamin (V-R VITAMIN B-12) 500 MCG tablet Take 1 tablet by mouth every morning.    Marland Kitchen omeprazole (PRILOSEC) 20 MG capsule Take 1 capsule by mouth every morning.    . potassium citrate (UROCIT-K) 10 MEQ (1080 MG) SR tablet Take 1 tablet by mouth 2 (two) times daily.    . pravastatin (PRAVACHOL) 40 MG tablet Take 1 tablet by mouth every evening.     No current facility-administered medications on file prior to visit.     Past Medical History  Diagnosis Date  . Brain tumor   .  Hypertension   . History of kidney stones   . Essential hypertension   . Hyperlipidemia      Past Surgical History  Procedure Laterality Date  . Brain surgery  brain tumor removed 2013     Family History  Problem Relation Age of Onset  . Heart attack Mother 3  . Heart disease Mother   . Hypertension Mother   . Hyperlipidemia Mother      History   Social History  . Marital Status: Married    Spouse Name: N/A  . Number of Children: N/A  . Years of Education: N/A   Occupational History  . Not on file.   Social History Main Topics  . Smoking status: Never Smoker   . Smokeless tobacco: Not on file  . Alcohol Use: No  . Drug Use: No  . Sexual Activity: Not on file   Other Topics Concern  . Not on file   Social History Narrative     ROS A 10 point review of system was performed. It is negative other than that mentioned in the history of present illness.   PHYSICAL EXAM   BP 140/60 mmHg  Pulse 79  Ht 5\' 6"  (1.676 m)  Wt 135 lb (61.236 kg)  BMI 21.80 kg/m2 Constitutional: She is oriented to person, place, and time. She appears well-developed and well-nourished. No distress.  HENT: No nasal  discharge.  Head: Normocephalic and atraumatic.  Eyes: Pupils are equal and round. No discharge.  Neck: Normal range of motion. Neck supple. No JVD present. No thyromegaly present.  Cardiovascular: Normal rate, regular rhythm, normal heart sounds. Exam reveals no gallop and no friction rub. No murmur heard.  Pulmonary/Chest: Effort normal and breath sounds normal. No stridor. No respiratory distress. She has no wheezes. She has no rales. She exhibits no tenderness.  Abdominal: Soft. Bowel sounds are normal. She exhibits no distension. There is no tenderness. There is no rebound and no guarding.  Musculoskeletal: Normal range of motion. She exhibits no edema and no tenderness.  Neurological: She is alert and oriented to person, place, and time. Coordination normal.  Skin:  Skin is warm and dry. No rash noted. She is not diaphoretic. No erythema. No pallor.  Psychiatric: She has a normal mood and affect. Her behavior is normal. Judgment and thought content normal.     KMQ:KMMNO  Rhythm  WITHIN NORMAL LIMITS   ASSESSMENT AND PLAN

## 2014-09-25 ENCOUNTER — Other Ambulatory Visit: Payer: Self-pay | Admitting: Internal Medicine

## 2014-09-25 DIAGNOSIS — R079 Chest pain, unspecified: Secondary | ICD-10-CM

## 2014-09-25 DIAGNOSIS — IMO0001 Reserved for inherently not codable concepts without codable children: Secondary | ICD-10-CM

## 2014-09-26 ENCOUNTER — Ambulatory Visit
Admission: RE | Admit: 2014-09-26 | Discharge: 2014-09-26 | Disposition: A | Discharge status: Home / Self Care | Payer: Medicare Other | Source: Ambulatory Visit | Attending: Internal Medicine | Admitting: Internal Medicine

## 2014-09-26 DIAGNOSIS — R079 Chest pain, unspecified: Secondary | ICD-10-CM

## 2014-09-26 DIAGNOSIS — R0789 Other chest pain: Secondary | ICD-10-CM | POA: Insufficient documentation

## 2014-09-26 DIAGNOSIS — IMO0001 Reserved for inherently not codable concepts without codable children: Secondary | ICD-10-CM

## 2014-09-26 DIAGNOSIS — R141 Gas pain: Secondary | ICD-10-CM | POA: Insufficient documentation

## 2014-10-24 ENCOUNTER — Ambulatory Visit: Payer: Medicare Other | Admitting: Cardiovascular Disease

## 2014-10-24 ENCOUNTER — Encounter: Payer: Self-pay | Admitting: Cardiovascular Disease

## 2014-10-24 ENCOUNTER — Ambulatory Visit (INDEPENDENT_AMBULATORY_CARE_PROVIDER_SITE_OTHER): Payer: Medicare Other | Admitting: Cardiovascular Disease

## 2014-10-24 VITALS — BP 120/60 | HR 84 | Ht 66.0 in | Wt 134.5 lb

## 2014-10-24 DIAGNOSIS — I10 Essential (primary) hypertension: Secondary | ICD-10-CM

## 2014-10-24 DIAGNOSIS — E785 Hyperlipidemia, unspecified: Secondary | ICD-10-CM | POA: Diagnosis not present

## 2014-10-24 DIAGNOSIS — R0789 Other chest pain: Secondary | ICD-10-CM

## 2014-10-24 NOTE — Assessment & Plan Note (Signed)
Continue treatment with a statin with a target LDL of less than 70.

## 2014-10-24 NOTE — Assessment & Plan Note (Signed)
Recent cardiac catheterization showed mild to moderate nonobstructive coronary artery disease. There is a possibility of endothelial dysfunction given her symptoms and significantly abnormal ECG to Lexiscan.  She is going to have further GI and pulmonary evaluation. If workup was nonrevealing and she continues to have symptoms, I will consider adding a long-acting nitroglycerin.

## 2014-10-24 NOTE — Patient Instructions (Signed)
Medication Instructions: Continue same medications.   Labwork: None.   Procedures/Testing: None.   Follow-Up: 6 months with Dr. Geraldin Habermehl.   Any Additional Special Instructions Will Be Listed Below (If Applicable).   

## 2014-10-24 NOTE — Progress Notes (Signed)
Primary care physician: Dr. Lisette Grinder  HPI  This is a pleasant 81 year old female who is here today for a follow-up visit regarding chest pain and nonobstructive coronary artery disease.   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 She is not a smoker. Her mother had myocardial infarction in her early 63s. She was seen recently for exertional chest pain and shortness of breath. I proceeded with a pharmacologic nuclear stress test. She had a severe reaction to [facility] with prolonged chest pain, shortness of breath and significant ST depression on EKG. Myocardial perfusion was normal. However, given her symptoms and abnormal ECG, I proceeded with cardiac catheterization via the right radial artery which showed 50% mid LAD stenosis which was not significant by FFR (0.9) and mild left circumflex/RCA disease. EF and LVEDP were both normal. She has been doing reasonably well and reports that her symptoms are somewhat better. She is going to have an EGD done for further evaluation. She is also going to see pulmonary for her symptoms.  Allergies  Allergen Reactions  . Phenazopyridine Rash  . Pyridium [Phenazopyridine Hcl] Rash  . Sulfa Antibiotics Rash    Other reaction(s): HIVES  . Tamsulosin Nausea And Vomiting     Current Outpatient Prescriptions on File Prior to Visit  Medication Sig Dispense Refill  . amLODipine (NORVASC) 2.5 MG tablet Take 1 tablet by mouth every morning.    Marland Kitchen aspirin EC 81 MG tablet Take 1 tablet by mouth every morning.    . calcium carbonate (CALCIUM 600) 600 MG TABS tablet Take 1 tablet by mouth every morning.    . cyanocobalamin (V-R VITAMIN B-12) 500 MCG tablet Take 1 tablet by mouth every morning.    Marland Kitchen omeprazole (PRILOSEC) 20 MG capsule Take 1 capsule by mouth every morning.    . potassium citrate (UROCIT-K) 10 MEQ (1080 MG) SR tablet Take 1 tablet by mouth 2 (two) times daily.    . pravastatin  (PRAVACHOL) 40 MG tablet Take 1 tablet by mouth every evening.     No current facility-administered medications on file prior to visit.     Past Medical History  Diagnosis Date  . Brain tumor   . Hypertension   . History of kidney stones   . Essential hypertension   . Hyperlipidemia      Past Surgical History  Procedure Laterality Date  . Brain surgery  brain tumor removed 2013  . Cardiac catheterization N/A 09/21/2014    Procedure: Left Heart Cath and Coronary Angiography;  Surgeon: Wellington Hampshire, MD;  Location: [facility];  Service: Cardiovascular;  Laterality: N/A;     Family History  Problem Relation Age of Onset  . Heart attack Mother 83  . Heart disease Mother   . Hypertension Mother   . Hyperlipidemia Mother      Social History   Social History  . Marital Status: Married    Spouse Name: N/A  . Number of Children: N/A  . Years of Education: N/A   Occupational History  . Not on file.   Social History Main Topics  . Smoking status: Never Smoker   . Smokeless tobacco: Not on file  . Alcohol Use: No  . Drug Use: No  . Sexual Activity: Not on file   Other Topics Concern  . Not on file   Social History Narrative     ROS A 10 point review of system was performed. It is negative other than  that mentioned in the history of present illness.   PHYSICAL EXAM   BP 120/60 mmHg  Pulse 84  Ht 5\' 6"  (1.676 m)  Wt 134 lb 8 oz (61.009 kg)  BMI 21.72 kg/m2 Constitutional: She is oriented to person, place, and time. She appears well-developed and well-nourished. No distress.  HENT: No nasal discharge.  Head: Normocephalic and atraumatic.  Eyes: Pupils are equal and round. No discharge.  Neck: Normal range of motion. Neck supple. No JVD present. No thyromegaly present.  Cardiovascular: Normal rate, regular rhythm, normal heart sounds. Exam reveals no gallop and no friction rub. No murmur heard.  Pulmonary/Chest: Effort normal and breath sounds  normal. No stridor. No respiratory distress. She has no wheezes. She has no rales. She exhibits no tenderness.  Abdominal: Soft. Bowel sounds are normal. She exhibits no distension. There is no tenderness. There is no rebound and no guarding.  Musculoskeletal: Normal range of motion. She exhibits no edema and no tenderness.  Neurological: She is alert and oriented to person, place, and time. Coordination normal.  Skin: Skin is warm and dry. No rash noted. She is not diaphoretic. No erythema. No pallor.  Psychiatric: She has a normal mood and affect. Her behavior is normal. Judgment and thought content normal.      ASSESSMENT AND PLAN

## 2014-10-24 NOTE — Assessment & Plan Note (Signed)
Blood pressure is controlled on current medications. 

## 2014-11-10 ENCOUNTER — Encounter: Payer: Self-pay | Admitting: *Deleted

## 2014-11-13 ENCOUNTER — Ambulatory Visit: Payer: Medicare Other | Admitting: Anesthesiology

## 2014-11-13 ENCOUNTER — Encounter
Admission: RE | Disposition: A | Discharge status: Home / Self Care | Payer: Self-pay | Source: Ambulatory Visit | Attending: Unknown Physician Specialty

## 2014-11-13 ENCOUNTER — Encounter: Payer: Self-pay | Admitting: Anesthesiology

## 2014-11-13 ENCOUNTER — Ambulatory Visit
Admission: RE | Admit: 2014-11-13 | Discharge: 2014-11-13 | Disposition: A | Discharge status: Home / Self Care | Payer: Medicare Other | Source: Ambulatory Visit | Attending: Unknown Physician Specialty | Admitting: Unknown Physician Specialty

## 2014-11-13 DIAGNOSIS — Z888 Allergy status to other drugs, medicaments and biological substances status: Secondary | ICD-10-CM | POA: Insufficient documentation

## 2014-11-13 DIAGNOSIS — Z8489 Family history of other specified conditions: Secondary | ICD-10-CM | POA: Insufficient documentation

## 2014-11-13 DIAGNOSIS — K219 Gastro-esophageal reflux disease without esophagitis: Secondary | ICD-10-CM | POA: Insufficient documentation

## 2014-11-13 DIAGNOSIS — I10 Essential (primary) hypertension: Secondary | ICD-10-CM | POA: Insufficient documentation

## 2014-11-13 DIAGNOSIS — K222 Esophageal obstruction: Secondary | ICD-10-CM | POA: Insufficient documentation

## 2014-11-13 DIAGNOSIS — Z90710 Acquired absence of both cervix and uterus: Secondary | ICD-10-CM | POA: Insufficient documentation

## 2014-11-13 DIAGNOSIS — R1310 Dysphagia, unspecified: Secondary | ICD-10-CM | POA: Insufficient documentation

## 2014-11-13 DIAGNOSIS — Z8249 Family history of ischemic heart disease and other diseases of the circulatory system: Secondary | ICD-10-CM | POA: Insufficient documentation

## 2014-11-13 DIAGNOSIS — R1013 Epigastric pain: Secondary | ICD-10-CM | POA: Insufficient documentation

## 2014-11-13 DIAGNOSIS — Z7982 Long term (current) use of aspirin: Secondary | ICD-10-CM | POA: Diagnosis not present

## 2014-11-13 DIAGNOSIS — E785 Hyperlipidemia, unspecified: Secondary | ICD-10-CM | POA: Insufficient documentation

## 2014-11-13 DIAGNOSIS — Z87442 Personal history of urinary calculi: Secondary | ICD-10-CM | POA: Diagnosis not present

## 2014-11-13 DIAGNOSIS — Z882 Allergy status to sulfonamides status: Secondary | ICD-10-CM | POA: Diagnosis not present

## 2014-11-13 HISTORY — DX: Nontraumatic intracerebral hemorrhage in cerebellum: I61.4

## 2014-11-13 HISTORY — DX: Gastro-esophageal reflux disease without esophagitis: K21.9

## 2014-11-13 HISTORY — PX: ESOPHAGOGASTRODUODENOSCOPY (EGD) WITH PROPOFOL: SHX5813

## 2014-11-13 SURGERY — ESOPHAGOGASTRODUODENOSCOPY (EGD) WITH PROPOFOL
Anesthesia: General

## 2014-11-13 MED ORDER — PROPOFOL INFUSION 10 MG/ML OPTIME
INTRAVENOUS | Status: DC | PRN
  Administered 2014-11-13: 150 ug/kg/min via INTRAVENOUS

## 2014-11-13 MED ORDER — ONDANSETRON HCL 4 MG/2ML IJ SOLN
INTRAMUSCULAR | Status: DC | PRN
  Administered 2014-11-13: 4 mg via INTRAVENOUS

## 2014-11-13 MED ORDER — BUTAMBEN-TETRACAINE-BENZOCAINE 2-2-14 % EX AERO
INHALATION_SPRAY | CUTANEOUS | Status: DC | PRN
  Administered 2014-11-13: 1 via TOPICAL

## 2014-11-13 MED ORDER — LIDOCAINE HCL (CARDIAC) 20 MG/ML IV SOLN
INTRAVENOUS | Status: DC | PRN
Start: 2014-11-13 — End: 2014-11-13
  Administered 2014-11-13: 20 mg via INTRAVENOUS

## 2014-11-13 MED ORDER — GLYCOPYRROLATE 0.2 MG/ML IJ SOLN
INTRAMUSCULAR | Status: DC | PRN
  Administered 2014-11-13: 0.2 mg via INTRAVENOUS

## 2014-11-13 MED ORDER — SODIUM CHLORIDE 0.9 % IV SOLN
INTRAVENOUS | Status: DC
  Administered 2014-11-13: 1000 mL via INTRAVENOUS

## 2014-11-13 MED ORDER — SODIUM CHLORIDE 0.9 % IV SOLN: INTRAVENOUS | Status: DC

## 2014-11-13 MED ORDER — PROPOFOL 10 MG/ML IV BOLUS
INTRAVENOUS | Status: DC | PRN
  Administered 2014-11-13: 40 mg via INTRAVENOUS
  Administered 2014-11-13: 30 mg via INTRAVENOUS
  Administered 2014-11-13: 60 mg via INTRAVENOUS

## 2014-11-13 NOTE — Anesthesia Preprocedure Evaluation (Signed)
Anesthesia Evaluation  Patient identified by MRN, date of birth, ID band Patient awake    Reviewed: Allergy & Precautions, H&P , NPO status , Patient's Chart, lab work & pertinent test results, reviewed documented beta blocker date and time   History of Anesthesia Complications (+) PONV and history of anesthetic complications  Airway Mallampati: II  TM Distance: >3 FB Neck ROM: full    Dental no notable dental hx. (+) Teeth Intact   Pulmonary shortness of breath, neg sleep apnea, neg COPD, neg recent URI,    Pulmonary exam normal breath sounds clear to auscultation       Cardiovascular Exercise Tolerance: Good hypertension, On Medications (-) angina+ CAD (20-30% occlusion of 2 vessels, no treatment)  (-) Past MI, (-) Cardiac Stents and (-) CABG Normal cardiovascular exam(-) dysrhythmias (-) Valvular Problems/Murmurs Rhythm:regular Rate:Normal     Neuro/Psych History of brain tumor and cerebellar hemorrhage negative psych ROS   GI/Hepatic Neg liver ROS, GERD  Medicated and Controlled,  Endo/Other  negative endocrine ROS  Renal/GU Renal disease (kidney stones)  negative genitourinary   Musculoskeletal   Abdominal   Peds  Hematology negative hematology ROS (+)   Anesthesia Other Findings Past Medical History:   Brain tumor                                                  Hypertension                                                 History of kidney stones                                     Essential hypertension                                       Hyperlipidemia                                               GERD (gastroesophageal reflux disease)                       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   Dental Advisory Given  Plan Discussed with: Anesthesiologist, CRNA and Surgeon  Anesthesia Plan Comments:         Anesthesia Quick Evaluation

## 2014-11-13 NOTE — H&P (Signed)
Primary Care Physician:  Madelyn Brunner, MD Primary Gastroenterologist:  Dr. Vira Agar  Pre-Procedure History & Physical: HPI:  Karen Liu is a 81 y.o. female is here for an endoscopy.   Past Medical History  Diagnosis Date  . Brain tumor   . Hypertension   . History of kidney stones   . Essential hypertension   . Hyperlipidemia   . GERD (gastroesophageal reflux disease)   . 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 Brain surgery  brain tumor removed 2013  . Cardiac catheterization N/A 09/21/2014    Procedure: Left Heart Cath and Coronary Angiography;  Surgeon: Wellington Hampshire, MD;  Location: [facility];  Service: Cardiovascular;  Laterality: N/A;  . Abdominal hysterectomy    . Appendectomy    . 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 Colonoscopy    . Esophagogastroduodenoscopy      Prior to Admission medications   Medication Sig Start Date End Date Taking? Authorizing Provider  amLODipine (NORVASC) 2.5 MG tablet Take 1 tablet by mouth every morning. 12/16/13  Yes Historical Provider, MD  aspirin EC 81 MG tablet Take 1 tablet by mouth every morning.   Yes Historical Provider, MD  calcium carbonate (CALCIUM 600) 600 MG TABS tablet Take 1 tablet by mouth every morning. 02/18/11  Yes Historical Provider, MD  cyanocobalamin (V-R VITAMIN B-12) 500 MCG tablet Take 1 tablet by mouth every morning. 02/18/11  Yes Historical Provider, MD  potassium citrate (UROCIT-K) 10 MEQ (1080 MG) SR tablet Take 1 tablet by mouth 2 (two) times daily. 05/04/14  Yes Historical Provider, MD  pravastatin (PRAVACHOL) 40 MG tablet Take 1 tablet by mouth every evening. 12/16/13  Yes Historical Provider, MD  omeprazole (PRILOSEC) 20 MG capsule Take 1 capsule by mouth every morning. 12/16/13   Historical Provider, MD    Allergies as of 10/18/2014 - Review Complete 09/21/2014  Allergen Reaction Noted  .  Phenazopyridine Rash 09/14/2014  . Pyridium [phenazopyridine hcl] Rash 09/11/2014  . Sulfa antibiotics Rash 09/11/2014  . Tamsulosin Nausea And Vomiting 09/14/2014    Family History  Problem Relation Age of Onset  . Heart attack Mother 88  . Heart disease Mother   . Hypertension Mother   . Hyperlipidemia Mother     Social History   Social History  . Marital Status: Widowed    Spouse Name: N/A  . Number of Children: N/A  . Years of Education: N/A   Occupational History  . Not on file.   Social History Main Topics  . Smoking status: Never Smoker   . Smokeless tobacco: Never Used  . Alcohol Use: No  . Drug Use: No  . Sexual Activity: Not on file   Other Topics Concern  . Not on file   Social History Narrative    Review of Systems: See HPI, otherwise negative ROS  Physical Exam: BP 123/70 mmHg  Pulse 69  Temp(Src) 96.3 F (35.7 C) (Tympanic)  Resp 16  Ht 5\' 5"  (1.651 m)  Wt 60.328 kg (133 lb)  BMI 22.13 kg/m2  SpO2 97% General:   Alert,  pleasant and cooperative in NAD Head:  Normocephalic and atraumatic. Neck:  Supple; no masses or thyromegaly. Lungs:  Clear throughout to auscultation.    Heart:  Regular rate and rhythm. Abdomen:  Soft, nontender and nondistended. Normal bowel sounds, without guarding, and without rebound.  Neurologic:  Alert and  oriented x4;  grossly normal neurologically.  Impression/Plan: Karen Liu is here for an endoscopy to be performed for epigastric abdominal pain, GERD  Risks, benefits, limitations, and alternatives regarding  endoscopy have been reviewed with the patient.  Questions have been answered.  All parties agreeable.   Gaylyn Cheers, MD  11/13/2014, 9:35 AM

## 2014-11-13 NOTE — Transfer of Care (Signed)
Immediate Anesthesia Transfer of Care Note  Patient: Karen Liu  Procedure(s) Performed: Procedure(s): ESOPHAGOGASTRODUODENOSCOPY (EGD) WITH PROPOFOL (N/A)  Patient Location: Endoscopy Unit  Anesthesia Type:General  Level of Consciousness: awake, alert  and oriented  Airway & Oxygen Therapy: Patient Spontanous Breathing and Patient connected to nasal cannula oxygen  Post-op Assessment: Report given to RN and Post -op Vital signs reviewed and stable  Post vital signs: Reviewed and stable  Last Vitals:  Filed Vitals:   11/13/14 0859  BP: 123/70  Pulse: 69  Temp: 35.7 C  Resp: 16    Complications: No apparent anesthesia complications

## 2014-11-13 NOTE — Op Note (Signed)
[facility] Gastroenterology Patient Name: Karen Liu Procedure Date: 11/13/2014 9:30 AM MRN: [id number] Account #: [id number] Date of Birth: 09/12/1930 Admit Type: Outpatient Age: 81 Room: [facility] ENDO ROOM 1 Gender: Female Note Status: Finalized Procedure:         Upper GI endoscopy Indications:       Epigastric abdominal pain, Dysphagia, Heartburn Providers:         Manya Silvas, MD Referring MD:      Hewitt Blade. Sarina Ser, MD (Referring MD) Medicines:         Propofol per Anesthesia Complications:     No immediate complications. Procedure:         Pre-Anesthesia Assessment:                    - After reviewing the risks and benefits, the patient was                     deemed in satisfactory condition to undergo the procedure.                    After obtaining informed consent, the endoscope was passed                     under direct vision. Throughout the procedure, the                     patient's blood pressure, pulse, and oxygen saturations                     were monitored continuously. The Olympus GIF-160 endoscope                     (S#. [id number]) was introduced through the mouth, and                     advanced to the second part of duodenum. The upper GI                     endoscopy was accomplished without difficulty. The patient                     tolerated the procedure well. Findings:      A smooth rounded prominent mass was seen in the throat adjacent to the       epiglottis. picture taken      A mild Schatzki ring (acquired) was found at the gastroesophageal       junction. A guidewire was placed and the scope was withdrawn. Dilation       was performed with a Savary dilator with mild resistance at 16 mm.      The entire examined stomach was normal.      The examined duodenum was normal. Impression:        - Mild Schatzki ring. Dilated.                    - Normal stomach.                    - Normal examined duodenum.      - No specimens collected. Recommendation:    - The findings and recommendations were discussed with the                     patient's family. Manya Silvas, MD 11/13/2014  10:00:10 AM This report has been signed electronically. Number of Addenda: 0 Note Initiated On: 11/13/2014 9:30 AM      [facility]

## 2014-11-13 NOTE — Anesthesia Procedure Notes (Signed)
Date/Time: 11/13/2014 9:38 AM Performed by: Martha Clan Pre-anesthesia Checklist: Patient identified, Emergency Drugs available, Suction available, Patient being monitored and Timeout performed Patient Re-evaluated:Patient Re-evaluated prior to inductionOxygen Delivery Method: Nasal cannula Preoxygenation: Pre-oxygenation with 100% oxygen

## 2014-11-14 ENCOUNTER — Encounter: Payer: Self-pay | Admitting: Unknown Physician Specialty

## 2014-11-14 NOTE — Anesthesia Postprocedure Evaluation (Signed)
  Anesthesia Post-op Note  Patient: Karen Liu  Procedure(s) Performed: Procedure(s): ESOPHAGOGASTRODUODENOSCOPY (EGD) WITH PROPOFOL (N/A)  Anesthesia type:General  Patient location: PACU  Post pain: Pain level controlled  Post assessment: Post-op Vital signs reviewed, Patient's Cardiovascular Status Stable, Respiratory Function Stable, Patent Airway and No signs of Nausea or vomiting  Post vital signs: Reviewed and stable  Last Vitals:  Filed Vitals:   11/13/14 1050  BP: 137/76  Pulse: 82  Temp:   Resp: 14    Level of consciousness: awake, alert  and patient cooperative  Complications: No apparent anesthesia complications

## 2014-11-17 ENCOUNTER — Encounter: Payer: Self-pay | Admitting: *Deleted

## 2014-11-21 NOTE — Discharge Instructions (Signed)

## 2014-11-22 ENCOUNTER — Encounter
Admission: RE | Disposition: A | Discharge status: Home / Self Care | Payer: Self-pay | Source: Ambulatory Visit | Attending: Unknown Physician Specialty

## 2014-11-22 ENCOUNTER — Encounter: Payer: Self-pay | Admitting: *Deleted

## 2014-11-22 ENCOUNTER — Ambulatory Visit: Payer: Medicare Other | Admitting: Anesthesiology

## 2014-11-22 ENCOUNTER — Ambulatory Visit
Admission: RE | Admit: 2014-11-22 | Discharge: 2014-11-22 | Disposition: A | Discharge status: Home / Self Care | Payer: Medicare Other | Source: Ambulatory Visit | Attending: Unknown Physician Specialty | Admitting: Unknown Physician Specialty

## 2014-11-22 DIAGNOSIS — Z882 Allergy status to sulfonamides status: Secondary | ICD-10-CM | POA: Diagnosis not present

## 2014-11-22 DIAGNOSIS — Z823 Family history of stroke: Secondary | ICD-10-CM | POA: Insufficient documentation

## 2014-11-22 DIAGNOSIS — R1310 Dysphagia, unspecified: Secondary | ICD-10-CM | POA: Diagnosis present

## 2014-11-22 DIAGNOSIS — Z8349 Family history of other endocrine, nutritional and metabolic diseases: Secondary | ICD-10-CM | POA: Diagnosis not present

## 2014-11-22 DIAGNOSIS — R1013 Epigastric pain: Secondary | ICD-10-CM | POA: Insufficient documentation

## 2014-11-22 DIAGNOSIS — K219 Gastro-esophageal reflux disease without esophagitis: Secondary | ICD-10-CM | POA: Insufficient documentation

## 2014-11-22 DIAGNOSIS — Z809 Family history of malignant neoplasm, unspecified: Secondary | ICD-10-CM | POA: Insufficient documentation

## 2014-11-22 DIAGNOSIS — Z79899 Other long term (current) drug therapy: Secondary | ICD-10-CM | POA: Insufficient documentation

## 2014-11-22 DIAGNOSIS — J387 Other diseases of larynx: Secondary | ICD-10-CM | POA: Insufficient documentation

## 2014-11-22 DIAGNOSIS — Z888 Allergy status to other drugs, medicaments and biological substances status: Secondary | ICD-10-CM | POA: Insufficient documentation

## 2014-11-22 HISTORY — DX: Motion sickness, initial encounter: T75.3XXA

## 2014-11-22 HISTORY — DX: Unspecified osteoarthritis, unspecified site: M19.90

## 2014-11-22 HISTORY — DX: Other specified postprocedural states: R11.2

## 2014-11-22 HISTORY — PX: MICROLARYNGOSCOPY: SHX5208

## 2014-11-22 HISTORY — DX: Other specified postprocedural states: Z98.890

## 2014-11-22 HISTORY — DX: Dizziness and giddiness: R42

## 2014-11-22 SURGERY — MICROLARYNGOSCOPY
Anesthesia: General | Wound class: Clean Contaminated

## 2014-11-22 MED ORDER — ONDANSETRON HCL 4 MG/2ML IJ SOLN
INTRAMUSCULAR | Status: DC | PRN
  Administered 2014-11-22: 4 mg via INTRAVENOUS

## 2014-11-22 MED ORDER — GLYCOPYRROLATE 0.2 MG/ML IJ SOLN
INTRAMUSCULAR | Status: DC | PRN
  Administered 2014-11-22: 0.1 mg via INTRAVENOUS

## 2014-11-22 MED ORDER — LACTATED RINGERS IV SOLN
INTRAVENOUS | Status: DC
  Administered 2014-11-22: 07:00:00 via INTRAVENOUS

## 2014-11-22 MED ORDER — HYDROCODONE-ACETAMINOPHEN 5-325 MG PO TABS: 1.0000 | ORAL_TABLET | Freq: Four times a day (QID) | ORAL | Status: DC | PRN

## 2014-11-22 MED ORDER — PHENYLEPHRINE HCL 0.5 % NA SOLN
NASAL | Status: DC | PRN
  Administered 2014-11-22: 30 mL via TOPICAL

## 2014-11-22 MED ORDER — MIDAZOLAM HCL 5 MG/5ML IJ SOLN
INTRAMUSCULAR | Status: DC | PRN
  Administered 2014-11-22 (×2): 1 mg via INTRAVENOUS

## 2014-11-22 MED ORDER — OXYCODONE HCL 5 MG/5ML PO SOLN: 5.0000 mg | Freq: Once | ORAL | Status: DC | PRN

## 2014-11-22 MED ORDER — SUCCINYLCHOLINE CHLORIDE 20 MG/ML IJ SOLN
INTRAMUSCULAR | Status: DC | PRN
  Administered 2014-11-22: 80 mg via INTRAVENOUS

## 2014-11-22 MED ORDER — LIDOCAINE HCL (CARDIAC) 20 MG/ML IV SOLN
INTRAVENOUS | Status: DC | PRN
  Administered 2014-11-22: 50 mg via INTRAVENOUS

## 2014-11-22 MED ORDER — FENTANYL CITRATE (PF) 100 MCG/2ML IJ SOLN: 25.0000 ug | INTRAMUSCULAR | Status: DC | PRN

## 2014-11-22 MED ORDER — ACETAMINOPHEN 325 MG PO TABS: 325.0000 mg | ORAL_TABLET | ORAL | Status: DC | PRN

## 2014-11-22 MED ORDER — FENTANYL CITRATE (PF) 100 MCG/2ML IJ SOLN
INTRAMUSCULAR | Status: DC | PRN
  Administered 2014-11-22: 50 ug via INTRAVENOUS

## 2014-11-22 MED ORDER — PROPOFOL 10 MG/ML IV BOLUS
INTRAVENOUS | Status: DC | PRN
  Administered 2014-11-22: 50 mg via INTRAVENOUS

## 2014-11-22 MED ORDER — OXYCODONE HCL 5 MG PO TABS: 5.0000 mg | ORAL_TABLET | Freq: Once | ORAL | Status: DC | PRN

## 2014-11-22 MED ORDER — DEXAMETHASONE SODIUM PHOSPHATE 4 MG/ML IJ SOLN: 8.0000 mg | Freq: Once | INTRAMUSCULAR | Status: DC | PRN

## 2014-11-22 MED ORDER — ACETAMINOPHEN 160 MG/5ML PO SOLN
325.0000 mg | ORAL | Status: DC | PRN
  Administered 2014-11-22: 640 mg via ORAL

## 2014-11-22 MED ORDER — DEXAMETHASONE SODIUM PHOSPHATE 4 MG/ML IJ SOLN
INTRAMUSCULAR | Status: DC | PRN
  Administered 2014-11-22: 8 mg via INTRAVENOUS

## 2014-11-22 MED ORDER — LACTATED RINGERS IV SOLN: 500.0000 mL | INTRAVENOUS | Status: DC

## 2014-11-22 SURGICAL SUPPLY — 24 items
BASIN GRAD PLASTIC 32OZ STRL (MISCELLANEOUS) ×3 IMPLANT
BLOCK BITE GUARD (MISCELLANEOUS) ×3 IMPLANT
CNTNR SPEC 2.5X3XGRAD LEK (MISCELLANEOUS) ×1
CONT SPEC 4OZ STER OR WHT (MISCELLANEOUS) ×2
CONTAINER SPEC 2.5X3XGRAD LEK (MISCELLANEOUS) ×1 IMPLANT
COVER MAYO STAND STRL (DRAPES) ×3 IMPLANT
COVER TABLE BACK 60X90 (DRAPES) ×3 IMPLANT
CUP MEDICINE 2OZ PLAST GRAD ST (MISCELLANEOUS) ×3 IMPLANT
DRAPE SHEET LG 3/4 BI-LAMINATE (DRAPES) ×3 IMPLANT
DRESSING TELFA 4X3 1S ST N-ADH (GAUZE/BANDAGES/DRESSINGS) ×3 IMPLANT
GLOVE BIO SURGEON STRL SZ7.5 (GLOVE) ×3 IMPLANT
MARKER SKIN SURG W/RULER VIO (MISCELLANEOUS) ×3 IMPLANT
NEEDLE FILTER BLUNT 18X 1/2SAF (NEEDLE) ×2
NEEDLE FILTER BLUNT 18X1 1/2 (NEEDLE) ×1 IMPLANT
NS IRRIG 500ML POUR BTL (IV SOLUTION) ×3 IMPLANT
PATTIES SURGICAL .5 X.5 (GAUZE/BANDAGES/DRESSINGS) ×3 IMPLANT
SOL ANTI-FOG 6CC FOG-OUT (MISCELLANEOUS) ×1 IMPLANT
SOL FOG-OUT ANTI-FOG 6CC (MISCELLANEOUS) ×2
SPONGE XRAY 4X4 16PLY STRL (MISCELLANEOUS) ×3 IMPLANT
STRAP BODY AND KNEE 60X3 (MISCELLANEOUS) ×3 IMPLANT
SYRINGE 10CC LL (SYRINGE) ×3 IMPLANT
TOWEL OR 17X26 4PK STRL BLUE (TOWEL DISPOSABLE) ×3 IMPLANT
TUBING CONN 6MMX3.1M (TUBING) ×2
TUBING SUCTION CONN 0.25 STRL (TUBING) ×1 IMPLANT

## 2014-11-22 NOTE — Anesthesia Procedure Notes (Signed)
Procedure Name: Intubation Date/Time: 11/22/2014 8:42 AM Performed by: Mayme Genta Pre-anesthesia Checklist: Patient identified, Emergency Drugs available, Suction available, Patient being monitored and Timeout performed Patient Re-evaluated:Patient Re-evaluated prior to inductionOxygen Delivery Method: Circle system utilized Preoxygenation: Pre-oxygenation with 100% oxygen Intubation Type: IV induction Ventilation: Mask ventilation without difficulty Laryngoscope Size: Miller and 2 Grade View: Grade II Tube type: MLT Tube size: 6.0 mm Number of attempts: 1 Placement Confirmation: ETT inserted through vocal cords under direct vision,  positive ETCO2 and breath sounds checked- equal and bilateral Secured at: 21 cm Tube secured with: Tape Dental Injury: Teeth and Oropharynx as per pre-operative assessment

## 2014-11-22 NOTE — Anesthesia Postprocedure Evaluation (Signed)
  Anesthesia Post-op Note  Patient: Karen Liu  Procedure(s) Performed: Procedure(s): MICROLARYNGOSCOPY DIRECT WITH REMOVAL EPIGLOTTIC CYST (N/A)  Anesthesia type:General  Patient location: PACU  Post pain: Pain level controlled  Post assessment: Post-op Vital signs reviewed, Patient's Cardiovascular Status Stable, Respiratory Function Stable, Patent Airway and No signs of Nausea or vomiting  Post vital signs: Reviewed and stable  Last Vitals:  Filed Vitals:   11/22/14 0724  BP: 129/66  Pulse: 71  Temp: 36.4 C  Resp: 16    Level of consciousness: awake, alert  and patient cooperative  Complications: No apparent anesthesia complications

## 2014-11-22 NOTE — Anesthesia Preprocedure Evaluation (Addendum)
Anesthesia Evaluation  Patient identified by MRN, date of birth, ID band Patient awake    Reviewed: Allergy & Precautions, H&P , NPO status , Patient's Chart, lab work & pertinent test results, reviewed documented beta blocker date and time   History of Anesthesia Complications (+) PONV and history of anesthetic complications  Airway Mallampati: II  TM Distance: >3 FB Neck ROM: full    Dental no notable dental hx.    Pulmonary    Pulmonary exam normal breath sounds clear to auscultation       Cardiovascular Exercise Tolerance: Good hypertension, negative cardio ROS   Rhythm:regular Rate:Normal     Neuro/Psych negative neurological ROS  negative psych ROS   GI/Hepatic Neg liver ROS, GERD  Medicated,  Endo/Other  negative endocrine ROS  Renal/GU negative Renal ROS  negative genitourinary   Musculoskeletal   Abdominal   Peds  Hematology negative hematology ROS (+)   Anesthesia Other Findings   Reproductive/Obstetrics negative OB ROS                            Anesthesia Physical Anesthesia Plan  ASA: II  Anesthesia Plan: General   Post-op Pain Management:    Induction:   Airway Management Planned:   Additional Equipment:   Intra-op Plan:   Post-operative Plan:   Informed Consent: I have reviewed the patients History and Physical, chart, labs and discussed the procedure including the risks, benefits and alternatives for the proposed anesthesia with the patient or authorized representative who has indicated his/her understanding and acceptance.     Plan Discussed with: CRNA  Anesthesia Plan Comments:         Anesthesia Quick Evaluation                                  Anesthesia Evaluation  Patient identified by MRN, date of birth, ID band Patient awake    Reviewed: Allergy & Precautions, H&P , NPO status , Patient's Chart, lab work & pertinent test results,  reviewed documented beta blocker date and time   History of Anesthesia Complications (+) PONV and history of anesthetic complications  Airway Mallampati: II  TM Distance: >3 FB Neck ROM: full    Dental no notable dental hx. (+) Teeth Intact   Pulmonary shortness of breath, neg sleep apnea, neg COPD, neg recent URI,    Pulmonary exam normal breath sounds clear to auscultation       Cardiovascular Exercise Tolerance: Good hypertension, On Medications (-) angina+ CAD (20-30% occlusion of 2 vessels, no treatment)  (-) Past MI, (-) Cardiac Stents and (-) CABG Normal cardiovascular exam(-) dysrhythmias (-) Valvular Problems/Murmurs Rhythm:regular Rate:Normal     Neuro/Psych History of brain tumor and cerebellar hemorrhage negative psych ROS   GI/Hepatic Neg liver ROS, GERD  Medicated and Controlled,  Endo/Other  negative endocrine ROS  Renal/GU Renal disease (kidney stones)  negative genitourinary   Musculoskeletal   Abdominal   Peds  Hematology negative hematology ROS (+)   Anesthesia Other Findings Past Medical History:   Brain tumor                                                  Hypertension  History of kidney stones                                     Essential hypertension                                       Hyperlipidemia                                               GERD (gastroesophageal reflux disease)                       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   Dental Advisory Given  Plan Discussed with: Anesthesiologist, CRNA and Surgeon  Anesthesia Plan Comments:         Anesthesia Quick Evaluation

## 2014-11-22 NOTE — H&P (Signed)
  H+P  Reviewed and will be scanned in later. No changes noted. 

## 2014-11-22 NOTE — Transfer of Care (Signed)
Immediate Anesthesia Transfer of Care Note  Patient: Karen Liu  Procedure(s) Performed: Procedure(s): MICROLARYNGOSCOPY DIRECT WITH REMOVAL EPIGLOTTIC CYST (N/A)  Patient Location: PACU  Anesthesia Type: General  Level of Consciousness: awake, alert  and patient cooperative  Airway and Oxygen Therapy: Patient Spontanous Breathing and Patient connected to supplemental oxygen  Post-op Assessment: Post-op Vital signs reviewed, Patient's Cardiovascular Status Stable, Respiratory Function Stable, Patent Airway and No signs of Nausea or vomiting  Post-op Vital Signs: Reviewed and stable  Complications: No apparent anesthesia complications

## 2014-11-22 NOTE — Op Note (Signed)
11/22/2014  8:59 AM    Karen Liu  [id number]   Pre-Op Dx: EPIGLOTTIC CYST  Post-op Dx: SAME  Proc: Microlaryngoscopy with excision of epiglottic cyst  Surg:  Karen Liu T  Anes:  GOT  EBL:   Less than 10 cc  Comp:  None  Findings:  Cystic mass lingual surface epiglottis on the left  Procedure: Miss Asay was identified in the holding area and taken to the operating room placed in supine position. After general trach anesthesia the table was turned 90. A tooth guard was in place. A Dedo laryngoscope was introduced into the airway examination of the epiglottis showed a cystic mass on the lingual surface of the epiglottis on the left the laryngoscope was then suspended a 0 Hopkins rod was brought into the field and photo documentation was performed of the epiglottic cyst. The Hopkins rod was removed the operating my prescription brought into the field. Using the shaft Sherry microlaryngeal instruments a cup forcep was used to grasp the angle surface of the cyst. The 45 scissors were then used to open the cyst approximately 2-1/2 cc of mucopurulent debris was expressed. This was suctioned free to prevent entry into the airway. Holding the flap of the cyst wall the scissors were then used to marsupialize the cyst removing the entire lingual surface. This gave excellent opening of the cyst. A cottonoid pledget soaked with phenylephrine and lidocaine solution was then placed in the preepiglottic space and left for approximately 8 minutes. This was removed there was no evidence of active bleeding. Reexamination of the epiglottis showed no evidence of residual cyst. Patient is a return anesthesia she was awakened in the operating room taken recovery room stable condition  Specimens: Cyst wall  Dispo:   Good  Plan:  He will be discharged from PACU to home follow up with me in 2 weeks  MCQUEEN,CHAPMAN T  11/22/2014 8:59 AM

## 2014-11-23 ENCOUNTER — Encounter: Payer: Self-pay | Admitting: Unknown Physician Specialty

## 2014-11-24 LAB — SURGICAL PATHOLOGY

## 2014-12-20 DIAGNOSIS — Z87442 Personal history of urinary calculi: Secondary | ICD-10-CM | POA: Insufficient documentation

## 2015-01-08 ENCOUNTER — Other Ambulatory Visit: Payer: Self-pay | Admitting: Internal Medicine

## 2015-01-08 DIAGNOSIS — Z1231 Encounter for screening mammogram for malignant neoplasm of breast: Secondary | ICD-10-CM

## 2015-03-12 ENCOUNTER — Ambulatory Visit: Payer: Medicare Other

## 2015-03-16 ENCOUNTER — Other Ambulatory Visit: Payer: Self-pay | Admitting: Internal Medicine

## 2015-03-16 ENCOUNTER — Ambulatory Visit
Admission: RE | Admit: 2015-03-16 | Discharge: 2015-03-16 | Disposition: A | Discharge status: Home / Self Care | Payer: Medicare Other | Source: Ambulatory Visit | Attending: Internal Medicine | Admitting: Internal Medicine

## 2015-03-16 DIAGNOSIS — Z1231 Encounter for screening mammogram for malignant neoplasm of breast: Secondary | ICD-10-CM | POA: Insufficient documentation

## 2015-04-30 ENCOUNTER — Ambulatory Visit (INDEPENDENT_AMBULATORY_CARE_PROVIDER_SITE_OTHER): Payer: Medicare Other | Admitting: Cardiovascular Disease

## 2015-04-30 ENCOUNTER — Encounter: Payer: Self-pay | Admitting: Cardiovascular Disease

## 2015-04-30 VITALS — BP 130/62 | HR 78 | Ht 66.0 in | Wt 131.8 lb

## 2015-04-30 DIAGNOSIS — I10 Essential (primary) hypertension: Secondary | ICD-10-CM

## 2015-04-30 NOTE — Patient Instructions (Signed)
Medication Instructions: Continue same medications.   Labwork: None.   Procedures/Testing: None.   Follow-Up: 1 year with Dr. Jarick Harkins.   Any Additional Special Instructions Will Be Listed Below (If Applicable).     If you need a refill on your cardiac medications before your next appointment, please call your pharmacy.   

## 2015-04-30 NOTE — Progress Notes (Signed)
Cardiology Office Note   Date:  04/30/2015   ID:  Karen Liu, DOB [birth year], MRN [id number]  PCP:  Madelyn Brunner, MD  Cardiologist:   Kathlyn Sacramento, MD   Chief Complaint  Patient presents with  . other    6 month follow up. Meds reviewed by the patient verbally. "doing well."       History of Present Illness: Karen Liu is a 82 y.o. female who presents for a follow-up visit regarding moderate nonobstructive one-vessel coronary artery disease. 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 She is not a smoker. Her mother had myocardial infarction in her early 31s. Cardiac catheterization in July 2016 showed 50% mid LAD stenosis which was not significant by FFR (0.9) and mild left circumflex/RCA disease. EF and LVEDP were both normal. This was done after symptoms of chest pain and abnormal nuclear stress test. She was treated medically. 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 She also underwent EGD which showed an esophageal ring which was dilated successfully. She reports no chest pain or shortness of breath and then.   Past Medical History  Diagnosis Date  . Brain tumor ([place])   . Hypertension   . History of kidney stones   . Essential hypertension   . Hyperlipidemia   . GERD (gastroesophageal reflux disease)   . Cerebellar hemorrhage ([place]) 01/2011    LEFT  . Motion sickness     cars  . Vertigo     before brain surgery  . Arthritis     neck  . PONV (postoperative nausea and vomiting)     Past Surgical History  Procedure Laterality Date  . Brain surgery  brain tumor removed 2013  . Abdominal hysterectomy    . Appendectomy    . 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 Colonoscopy    . Esophagogastroduodenoscopy    . Esophagogastroduodenoscopy (egd) with propofol N/A 11/13/2014    Procedure: ESOPHAGOGASTRODUODENOSCOPY (EGD) WITH  PROPOFOL;  Surgeon: Manya Silvas, MD;  Location: [place];  Service: Endoscopy;  Laterality: N/A;  . Cataract extraction w/ intraocular lens  implant, bilateral    . Cardiac catheterization N/A 09/21/2014    Procedure: Left Heart Cath and Coronary Angiography;  Surgeon: Wellington Hampshire, MD;  Location: [place] [facility];  Service: Cardiovascular;  Laterality: N/A;  . Microlaryngoscopy N/A 11/22/2014    Procedure: MICROLARYNGOSCOPY DIRECT WITH REMOVAL EPIGLOTTIC CYST;  Surgeon: Beverly Gust, MD;  Location: [place];  Service: ENT;  Laterality: N/A;     Current Outpatient Prescriptions  Medication Sig Dispense Refill  . amLODipine (NORVASC) 2.5 MG tablet Take 1 tablet by mouth every morning.    Marland Kitchen aspirin EC 81 MG tablet Take 1 tablet by mouth every morning.    . calcium carbonate (CALCIUM 600) 600 MG TABS tablet Take 1 tablet by mouth every morning.    . cyanocobalamin (V-R VITAMIN B-12) 500 MCG tablet Take 1 tablet by mouth every morning.    Marland Kitchen HYDROcodone-acetaminophen (NORCO/VICODIN) 5-325 MG per tablet Take 1-2 tablets by mouth every 6 (six) hours as needed for moderate pain. 30 tablet 0  . omeprazole (PRILOSEC) 20 MG capsule Take 1 capsule by mouth every morning.    . potassium citrate (UROCIT-K) 10 MEQ (1080 MG) SR tablet Take 1 tablet by mouth 2 (two) times daily.    . pravastatin (PRAVACHOL) 40 MG tablet Take  1 tablet by mouth every evening.     No current facility-administered medications for this visit.    Allergies:   Lexiscan; Phenazopyridine; Pyridium; Sulfa antibiotics; and Tamsulosin    Social History:  The patient  reports that she has never smoked. She has never used smokeless tobacco. She reports that she does not drink alcohol or use illicit drugs.   Family History:  The patient's family history includes Heart attack (age of onset: 31) in her mother; Heart disease in her mother; Hyperlipidemia in her mother; Hypertension in her mother.    ROS:   Please see the history of present illness.   Otherwise, review of systems are positive for none.   All other systems are reviewed and negative.    PHYSICAL EXAM: VS:  BP 130/62 mmHg  Pulse 78  Ht 5\' 6"  (1.676 m)  Wt 131 lb 12 oz (59.761 kg)  BMI 21.28 kg/m2 , BMI Body mass index is 21.28 kg/(m^2). GEN: Well nourished, well developed, in no acute distress HEENT: normal Neck: no JVD, carotid bruits, or masses Cardiac: RRR; no murmurs, rubs, or gallops,no edema  Respiratory:  clear to auscultation bilaterally, normal work of breathing GI: soft, nontender, nondistended, + BS MS: no deformity or atrophy Skin: warm and dry, no rash Neuro:  Strength and sensation are intact Psych: euthymic mood, full affect   EKG:  EKG is ordered today. The ekg ordered today demonstrates normal sinus rhythm with no significant ST or T wave changes.   Recent Labs: 09/11/2014: BUN 15; Creatinine, Ser 0.83; Hemoglobin 13.5; Platelets 263; Potassium 3.5; Sodium 143    Lipid Panel No results found for: CHOL, TRIG, HDL, CHOLHDL, VLDL, LDLCALC, LDLDIRECT    Wt Readings from Last 3 Encounters:  04/30/15 131 lb 12 oz (59.761 kg)  11/22/14 132 lb (59.875 kg)  11/13/14 133 lb (60.328 kg)      Other studies Reviewed: Additional studies/ records that were reviewed today include: Lipid profile from October 2016 which showed a total cholesterol of 152, HDL of 54, triglyceride of 50 to and an LDL of 87.    ASSESSMENT AND PLAN:  1.  Coronary artery disease involving native coronary arteries of native heart without angina: Previous cardiac catheterization showed 50% LAD stenosis which was not significant by pressure wire interrogation. Chest pain was not cardiac in has resolved completely. I recommend continuing medical therapy with low-dose aspirin, a statin and blood pressure control. I discussed with her the importance of healthy lifestyle.  2. Hyperlipidemia: Lipid profile is as outlined above. LDL was  87. Ideally, this should be less than 70. I discussed with her improving her diet. Continue treatment with pravastatin.  3.  Essential hypertension: Blood pressure is well controlled on amlodipine.       Disposition:   FU with me in 1 year  Signed, Kathlyn Sacramento, MD  04/30/2015 1:41 PM    [place]

## 2016-02-11 ENCOUNTER — Other Ambulatory Visit: Payer: Self-pay | Admitting: Internal Medicine

## 2016-02-11 DIAGNOSIS — Z1231 Encounter for screening mammogram for malignant neoplasm of breast: Secondary | ICD-10-CM

## 2016-03-19 ENCOUNTER — Ambulatory Visit: Payer: Medicare Other

## 2016-04-16 ENCOUNTER — Ambulatory Visit
Admission: RE | Admit: 2016-04-16 | Discharge: 2016-04-16 | Disposition: A | Discharge status: Home / Self Care | Payer: Medicare Other | Source: Ambulatory Visit | Attending: Internal Medicine | Admitting: Internal Medicine

## 2016-04-16 DIAGNOSIS — Z1231 Encounter for screening mammogram for malignant neoplasm of breast: Secondary | ICD-10-CM | POA: Diagnosis not present

## 2016-04-29 ENCOUNTER — Ambulatory Visit (INDEPENDENT_AMBULATORY_CARE_PROVIDER_SITE_OTHER): Payer: Medicare Other | Admitting: Internal Medicine

## 2016-04-29 ENCOUNTER — Encounter: Payer: Self-pay | Admitting: Cardiovascular Disease

## 2016-04-29 VITALS — BP 132/66 | HR 66 | Ht 66.0 in | Wt 134.0 lb

## 2016-04-29 DIAGNOSIS — I2510 Atherosclerotic heart disease of native coronary artery without angina pectoris: Secondary | ICD-10-CM | POA: Insufficient documentation

## 2016-04-29 DIAGNOSIS — E785 Hyperlipidemia, unspecified: Secondary | ICD-10-CM

## 2016-04-29 DIAGNOSIS — I10 Essential (primary) hypertension: Secondary | ICD-10-CM | POA: Diagnosis not present

## 2016-04-29 NOTE — Progress Notes (Signed)
Cardiology Office Note   Date:  04/29/2016   ID:  Karen Liu, DOB Feb 16, 1931, MRN [id number]  PCP:  Karen Brunner, MD  Cardiologist:   Karen Sacramento, MD  Chief Complaint  Patient presents with  . other    12 month follow up. Patient states she has been doing well. Meds reviewed verbally with patient.       History of Present Illness: Karen Liu is a 83 y.o. female who presents for a follow-up visit regarding moderate nonobstructive one-vessel coronary artery disease. 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 She is not a smoker. Her mother had myocardial infarction in her early 76s. Cardiac catheterization in July 2016 showed 50% mid LAD stenosis which was not significant by FFR (0.9) and mild left circumflex/RCA disease. EF and LVEDP were both normal. This was done after symptoms of chest pain and abnormal nuclear stress test. She was treated medically. 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 She also underwent EGD which showed an esophageal ring which was dilated successfully.  Since her last visit with Dr. Fletcher Anon one year ago, Karen Liu has felt well. She specifically denies chest pain, shortness breath, palpitations, lightheadedness, orthopnea, and leg edema. She remains active, walking on a regular basis. She is tolerating her current medication regimen well. She has been compliant with lifestyle modifications in an effort to improve her cholesterol.  Past Medical History:  Diagnosis Date  . Arthritis    neck  . Brain tumor ([place])   . 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 Essential hypertension   . GERD (gastroesophageal reflux disease)   . History of kidney stones   . Hyperlipidemia   . Hypertension   . Motion sickness    cars  . PONV (postoperative nausea and vomiting)   . Vertigo    before brain surgery    Past Surgical History:  Procedure Laterality Date   . ABDOMINAL HYSTERECTOMY    . APPENDECTOMY    . BRAIN SURGERY  brain tumor removed 2013  . CARDIAC CATHETERIZATION N/A 09/21/2014   Procedure: Left Heart Cath and Coronary Angiography;  Surgeon: Karen Hampshire, MD;  Location: [facility];  Service: Cardiovascular;  Laterality: N/A;  . CATARACT EXTRACTION W/ INTRAOCULAR LENS  IMPLANT, BILATERAL    . 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 COLONOSCOPY    . ESOPHAGOGASTRODUODENOSCOPY    . ESOPHAGOGASTRODUODENOSCOPY (EGD) WITH PROPOFOL N/A 11/13/2014   Procedure: ESOPHAGOGASTRODUODENOSCOPY (EGD) WITH PROPOFOL;  Surgeon: Karen Silvas, MD;  Location: [facility] ENDOSCOPY;  Service: Endoscopy;  Laterality: N/A;  . MICROLARYNGOSCOPY N/A 11/22/2014   Procedure: MICROLARYNGOSCOPY DIRECT WITH REMOVAL EPIGLOTTIC CYST;  Surgeon: Karen Gust, MD;  Location: [facility];  Service: ENT;  Laterality: N/A;     Current Outpatient Prescriptions  Medication Sig Dispense Refill  . amLODipine (NORVASC) 2.5 MG tablet Take 1 tablet by mouth every morning.    Marland Kitchen aspirin EC 81 MG tablet Take 1 tablet by mouth every morning.    . calcium carbonate (CALCIUM 600) 600 MG TABS tablet Take 1 tablet by mouth every morning.    . cyanocobalamin (V-R VITAMIN B-12) 500 MCG tablet Take 1 tablet by mouth every morning.    Marland Kitchen omeprazole (PRILOSEC) 20 MG capsule Take 1 capsule by mouth every morning.    . potassium citrate (UROCIT-K) 10 MEQ (1080 MG) SR tablet Take 1  tablet by mouth 2 (two) times daily.    . pravastatin (PRAVACHOL) 40 MG tablet Take 1 tablet by mouth every evening.     No current facility-administered medications for this visit.     Allergies:   Lexiscan [regadenoson]; Phenazopyridine; Pyridium [phenazopyridine hcl]; Sulfa antibiotics; and Tamsulosin    Social History:  The patient  reports that she has never smoked. She has never used smokeless tobacco. She reports that she does not drink alcohol or use  drugs.   Family History:  The patient's family history includes Heart attack (age of onset: 73) in her mother; Heart disease in her mother; Hyperlipidemia in her mother; Hypertension in her mother.    ROS:  Please see the history of present illness.   Otherwise, review of systems are positive for none.   All other systems are reviewed and negative.    PHYSICAL EXAM: VS:  BP 132/66 (BP Location: Left Arm, Patient Position: Sitting, Cuff Size: Normal)   Pulse 66   Ht 5\' 6"  (1.676 m)   Wt 134 lb (60.8 kg)   BMI 21.63 kg/m  , BMI Body mass index is 21.63 kg/m. GEN: Well nourished, well developed, in no acute distress  HEENT: No conjunctival pallor or scleral icterus. OP clear. Neck: Supple without lymphadenopathy, thyromegaly, JVD, or HJR. Cardiac: RRR; no murmurs, rubs, or gallops,no edema  Respiratory:  clear to auscultation bilaterally, normal work of breathing GI: soft, nontender, nondistended, + BS. No hepatosplenomegaly. MS: no deformity or atrophy  Skin: warm and dry, no rash  EKG:  EKG is ordered today. Normal sinus rhythm with poor R-wave progression in V1 and V2 which I suspect is due to lead placement. Otherwise, there is been no significant change from last year's tracing (I have personally reviewed both tracings.  Recent Labs: No results found for requested labs within last 8760 hours.    Lipid Panel 12/13/68 (outside clinic): Total cholesterol 131, triglycerides 50, HDL 50, LDL 71   Wt Readings from Last 3 Encounters:  04/29/16 134 lb (60.8 kg)  04/30/15 131 lb 12 oz (59.8 kg)  11/22/14 132 lb (59.9 kg)       ASSESSMENT AND PLAN:  1.  Coronary artery disease involving native coronary arteries of native heart without angina: Patient remains asymptomatic. We will continue with lifestyle modification and medical therapy, including low-dose aspirin, amlodipine, and proximal stem.  2. Hyperlipidemia: Was recently lipid panel by her PCP in 12/2015 revealed an LDL  minimally above our goal of less than 70. Patient is currently on pravastatin 40. Given her age, I think it is reasonable to continue with this moderate-intensity statin. I reiterated lifestyle modifications, which the patient will continue to stick with.  3.  Essential hypertension: Blood pressure is reasonable well controlled today. We will continue with low-dose amlodipine.  Disposition:   Follow-up with Dr. Fletcher Anon in 1 year.  Signed, Nelva Bush, MD  04/29/2016 3:40 PM    [facility]

## 2016-04-29 NOTE — Patient Instructions (Signed)
Medication Instructions:  Your physician recommends that you continue on your current medications as directed. Please refer to the Current Medication list given to you today.   Labwork: none  Testing/Procedures: none  Follow-Up: Your physician wants you to follow-up in: one year with Dr. Arida.  You will receive a reminder letter in the mail two months in advance. If you don't receive a letter, please call our office to schedule the follow-up appointment.   Any Other Special Instructions Will Be Listed Below (If Applicable).     If you need a refill on your cardiac medications before your next appointment, please call your pharmacy.   

## 2017-02-19 ENCOUNTER — Ambulatory Visit (INDEPENDENT_AMBULATORY_CARE_PROVIDER_SITE_OTHER): Payer: Medicare Other | Admitting: Urology

## 2017-02-19 ENCOUNTER — Encounter: Payer: Self-pay | Admitting: Urology

## 2017-02-19 VITALS — BP 131/72 | HR 88 | Ht 66.0 in | Wt 130.0 lb

## 2017-02-19 DIAGNOSIS — N200 Calculus of kidney: Secondary | ICD-10-CM | POA: Diagnosis not present

## 2017-02-19 LAB — URINALYSIS, COMPLETE
BILIRUBIN UA: NEGATIVE
GLUCOSE, UA: NEGATIVE
Ketones, UA: NEGATIVE
Nitrite, UA: NEGATIVE
PH UA: 5.5 (ref 5.0–7.5)
PROTEIN UA: NEGATIVE
UUROB: 0.2 mg/dL (ref 0.2–1.0)

## 2017-02-19 LAB — MICROSCOPIC EXAMINATION: RBC, UA: NONE SEEN /hpf (ref 0–?)

## 2017-02-19 MED ORDER — POTASSIUM CITRATE ER 10 MEQ (1080 MG) PO TBCR: 10.0000 meq | EXTENDED_RELEASE_TABLET | Freq: Two times a day (BID) | ORAL | 3 refills | Status: DC

## 2017-02-19 NOTE — Progress Notes (Signed)
02/19/2017 9:45 AM   Karen Liu 05-07-1930 [id number]  Referring provider: Madelyn Brunner, MD No address on file  Chief Complaint  Patient presents with  . Nephrolithiasis    1year follow up    HPI: 83 year old female presents for annual follow-up of nephrolithiasis.  I last saw her [facility] in December 2017. She was seen January 2016 with a 7 mm distal ureteral calculus and left nephrolithiasis. She subsequently passed the stone and analysis was remarkable for 80% uric acid and the remainder calcium oxalate. 24 urine study was remarkable for low citrate levels and a urine volume of 1.42 L. Serum and urine uric acids were normal. She was started on hydration and potassium citrate  She remains on potassium citrate and states she is doing well.  She has passed 2-3 small stones this year however had no significant pain.  She has no bothersome lower urinary tract symptoms.  Denies gross hematuria.   PMH: Past Medical History:  Diagnosis Date  . Arthritis    neck  . Brain tumor ([place])   . 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 Essential hypertension   . GERD (gastroesophageal reflux disease)   . History of kidney stones   . Hyperlipidemia   . Hypertension   . Motion sickness    cars  . PONV (postoperative nausea and vomiting)   . Vertigo    before brain surgery    Surgical History: Past Surgical History:  Procedure Laterality Date  . ABDOMINAL HYSTERECTOMY    . APPENDECTOMY    . BRAIN SURGERY  brain tumor removed 2013  . CARDIAC CATHETERIZATION N/A 09/21/2014   Procedure: Left Heart Cath and Coronary Angiography;  Surgeon: Wellington Hampshire, MD;  Location: [place] [facility];  Service: Cardiovascular;  Laterality: N/A;  . CATARACT EXTRACTION W/ INTRAOCULAR LENS  IMPLANT, BILATERAL    . 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 COLONOSCOPY    . ESOPHAGOGASTRODUODENOSCOPY    . ESOPHAGOGASTRODUODENOSCOPY (EGD) WITH PROPOFOL N/A  11/13/2014   Procedure: ESOPHAGOGASTRODUODENOSCOPY (EGD) WITH PROPOFOL;  Surgeon: Manya Silvas, MD;  Location: [facility] ENDOSCOPY;  Service: Endoscopy;  Laterality: N/A;  . MICROLARYNGOSCOPY N/A 11/22/2014   Procedure: MICROLARYNGOSCOPY DIRECT WITH REMOVAL EPIGLOTTIC CYST;  Surgeon: Beverly Gust, MD;  Location: [place];  Service: ENT;  Laterality: N/A;    Home Medications:  Allergies as of 02/19/2017      Reactions   Lexiscan [regadenoson]    Severe chest pain, dyspnea and distress (prolonged)   Phenazopyridine Rash   Pyridium [phenazopyridine Hcl] Rash   Sulfa Antibiotics Rash   Other reaction(s): HIVES   Tamsulosin Nausea And Vomiting      Medication List        Accurate as of 02/19/17  9:45 AM. Always use your most recent med list.          amLODipine 2.5 MG tablet Commonly known as:  NORVASC Take 1 tablet by mouth every morning.   aspirin EC 81 MG tablet Take 1 tablet by mouth every morning.   CALCIUM 600 600 MG Tabs tablet Generic drug:  calcium carbonate Take 1 tablet by mouth every morning.   omeprazole 20 MG capsule Commonly known as:  PRILOSEC Take 1 capsule by mouth every morning.   potassium citrate 10 MEQ (1080 MG) SR tablet Commonly known as:  UROCIT-K Take 1 tablet by mouth 2 (two) times daily.   pravastatin 40 MG tablet Commonly  known as:  PRAVACHOL Take 1 tablet by mouth every evening.   V-R VITAMIN B-12 500 MCG tablet Generic drug:  cyanocobalamin Take 1 tablet by mouth every morning.       Allergies:  Allergies  Allergen Reactions  . Lexiscan [Regadenoson]     Severe chest pain, dyspnea and distress (prolonged)  . Phenazopyridine Rash  . Pyridium [Phenazopyridine Hcl] Rash  . Sulfa Antibiotics Rash    Other reaction(s): HIVES  . Tamsulosin Nausea And Vomiting    Family History: Family History  Problem Relation Age of Onset  . Heart attack Mother 61  . Heart disease Mother   . Hypertension Mother   .  Hyperlipidemia Mother     Social History:  reports that  has never smoked. she has never used smokeless tobacco. She reports that she does not drink alcohol or use drugs.  ROS: UROLOGY Frequent Urination?: Yes Hard to postpone urination?: No Burning/pain with urination?: No Get up at night to urinate?: Yes Leakage of urine?: Yes Urine stream starts and stops?: No Trouble starting stream?: No Do you have to strain to urinate?: No Blood in urine?: No Urinary tract infection?: No Sexually transmitted disease?: No Injury to kidneys or bladder?: No Painful intercourse?: No Weak stream?: No Currently pregnant?: No Vaginal bleeding?: No Last menstrual period?: n  Gastrointestinal Nausea?: No Vomiting?: No Indigestion/heartburn?: Yes Diarrhea?: No Constipation?: No  Constitutional Fever: No Night sweats?: No Weight loss?: No Fatigue?: No  Skin Skin rash/lesions?: No Itching?: No  Eyes Blurred vision?: No Double vision?: No  Ears/Nose/Throat Sore throat?: No Sinus problems?: No  Hematologic/Lymphatic Swollen glands?: No Easy bruising?: No  Cardiovascular Leg swelling?: No Chest pain?: No  Respiratory Cough?: No Shortness of breath?: No  Endocrine Excessive thirst?: No  Musculoskeletal Back pain?: No Joint pain?: No  Neurological Headaches?: No Dizziness?: No  Psychologic Depression?: No Anxiety?: No  Physical Exam: BP 131/72   Pulse 88   Ht 5\' 6"  (1.676 m)   Wt 130 lb (59 kg)   BMI 20.98 kg/m   Constitutional:  Alert and oriented, No acute distress. HEENT: [place] AT, moist mucus membranes.  Trachea midline, no masses. Cardiovascular: No clubbing, cyanosis, or edema. Respiratory: Normal respiratory effort, no increased work of breathing. GI: Abdomen is soft, nontender, nondistended, no abdominal masses GU: No CVA tenderness. Skin: No rashes, bruises or suspicious lesions. Lymph: No cervical or inguinal adenopathy. Neurologic: Grossly  intact, no focal deficits, moving all 4 extremities. Psychiatric: Normal mood and affect.  Laboratory Data: Lab Results  Component Value Date   WBC 6.0 09/11/2014   HGB 13.5 09/11/2014   HCT 40.6 09/11/2014   MCV 92.2 09/11/2014   PLT 263 09/11/2014    Lab Results  Component Value Date   CREATININE 0.83 09/11/2014    Urinalysis    Assessment & Plan:   1. Nephrolithiasis Stable.  She is presently asymptomatic.  With a history of uric acid stones have recommended a renal ultrasound and KUB.  If no significant abnormalities she will continue annual follow-up.  - Urinalysis, Complete   Abbie Sons, MD  [facility] [address] [place], [place] [address] [phone]

## 2017-03-11 ENCOUNTER — Ambulatory Visit
Admission: RE | Admit: 2017-03-11 | Discharge: 2017-03-11 | Disposition: A | Discharge status: Home / Self Care | Payer: Medicare Other | Source: Ambulatory Visit | Attending: Urology | Admitting: Urology

## 2017-03-11 DIAGNOSIS — N200 Calculus of kidney: Secondary | ICD-10-CM

## 2017-03-11 DIAGNOSIS — N2889 Other specified disorders of kidney and ureter: Secondary | ICD-10-CM | POA: Insufficient documentation

## 2017-03-25 ENCOUNTER — Other Ambulatory Visit: Payer: Self-pay | Admitting: Urology

## 2017-03-25 DIAGNOSIS — N2889 Other specified disorders of kidney and ureter: Secondary | ICD-10-CM

## 2017-03-26 ENCOUNTER — Telehealth: Payer: Self-pay | Admitting: Urology

## 2017-03-26 NOTE — Telephone Encounter (Signed)
Just checking to see when you wanted this CT scan? I checked your notes and didn't see anything, please advise  Thanks, Sharyn Lull

## 2017-03-27 NOTE — Telephone Encounter (Signed)
Mass was incidentally seen on ultrasound.  Please schedule within the next 4-6 weeks

## 2017-03-27 NOTE — Telephone Encounter (Signed)
The patient called and scheduled it for Monday the 28th should I cancel it and move it out? I didn't know she did this, it just happened yesterday.  Sharyn Lull

## 2017-03-29 NOTE — Telephone Encounter (Signed)
It is fine to have performed on the 28th.

## 2017-03-30 ENCOUNTER — Ambulatory Visit
Admission: RE | Admit: 2017-03-30 | Discharge: 2017-03-30 | Disposition: A | Discharge status: Home / Self Care | Payer: Medicare Other | Source: Ambulatory Visit | Attending: Urology | Admitting: Urology

## 2017-03-30 DIAGNOSIS — R93422 Abnormal radiologic findings on diagnostic imaging of left kidney: Secondary | ICD-10-CM | POA: Insufficient documentation

## 2017-03-30 DIAGNOSIS — N2889 Other specified disorders of kidney and ureter: Secondary | ICD-10-CM

## 2017-03-30 DIAGNOSIS — R590 Localized enlarged lymph nodes: Secondary | ICD-10-CM | POA: Diagnosis not present

## 2017-03-30 DIAGNOSIS — J479 Bronchiectasis, uncomplicated: Secondary | ICD-10-CM | POA: Insufficient documentation

## 2017-03-30 DIAGNOSIS — I700 Atherosclerosis of aorta: Secondary | ICD-10-CM | POA: Diagnosis not present

## 2017-03-30 LAB — POCT I-STAT CREATININE: Creatinine, Ser: 0.8 mg/dL (ref 0.44–1.00)

## 2017-03-30 MED ORDER — IOPAMIDOL (ISOVUE-370) INJECTION 76%
100.0000 mL | Freq: Once | INTRAVENOUS | Status: AC | PRN
  Administered 2017-03-30: 100 mL via INTRAVENOUS

## 2017-04-02 ENCOUNTER — Ambulatory Visit: Payer: Medicare Other | Admitting: Urology

## 2017-04-09 ENCOUNTER — Ambulatory Visit (INDEPENDENT_AMBULATORY_CARE_PROVIDER_SITE_OTHER): Payer: Medicare Other | Admitting: Urology

## 2017-04-09 DIAGNOSIS — N200 Calculus of kidney: Secondary | ICD-10-CM

## 2017-04-09 NOTE — Progress Notes (Signed)
04/09/2017 3:14 PM   Karen Liu Nov 01, 1930 [id number]  Referring provider: Baxter Hire, MD [place], [place] [address]  Chief Complaint  Patient presents with  . Nephrolithiasis    Results    HPI: 84 year old female presents for follow-up of a recent CT scan.  She has a history of uric acid stones and at her visit in late December 2018 a renal ultrasound and KUB were recommended.  The KUB showed small, bilateral renal calculi which was confirmed by the ultrasound.  She was incidentally noted to have a 2 cm exophytic mass off the inferior pole of the left kidney that was felt suspicious for renal cell carcinoma.  She presents today for follow-up of her renal mass protocol CT.  No renal mass was identified on the CT in the area of concern on ultrasound.  She had bilateral, nonobstructing renal calculi.   PMH: Past Medical History:  Diagnosis Date  . Arthritis    neck  . Brain tumor ([place])   . 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 Essential hypertension   . GERD (gastroesophageal reflux disease)   . History of kidney stones   . Hyperlipidemia   . Hypertension   . Motion sickness    cars  . PONV (postoperative nausea and vomiting)   . Vertigo    before brain surgery    Surgical History: Past Surgical History:  Procedure Laterality Date  . ABDOMINAL HYSTERECTOMY    . APPENDECTOMY    . BRAIN SURGERY  brain tumor removed 2013  . CARDIAC CATHETERIZATION N/A 09/21/2014   Procedure: Left Heart Cath and Coronary Angiography;  Surgeon: Wellington Hampshire, MD;  Location: [place] [facility];  Service: Cardiovascular;  Laterality: N/A;  . CATARACT EXTRACTION W/ INTRAOCULAR LENS  IMPLANT, BILATERAL    . 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 COLONOSCOPY    . ESOPHAGOGASTRODUODENOSCOPY    . ESOPHAGOGASTRODUODENOSCOPY (EGD) WITH PROPOFOL N/A 11/13/2014   Procedure: ESOPHAGOGASTRODUODENOSCOPY (EGD) WITH PROPOFOL;   Surgeon: Manya Silvas, MD;  Location: [facility] ENDOSCOPY;  Service: Endoscopy;  Laterality: N/A;  . MICROLARYNGOSCOPY N/A 11/22/2014   Procedure: MICROLARYNGOSCOPY DIRECT WITH REMOVAL EPIGLOTTIC CYST;  Surgeon: Beverly Gust, MD;  Location: [place];  Service: ENT;  Laterality: N/A;    Home Medications:  Allergies as of 04/09/2017      Reactions   Lexiscan [regadenoson]    Severe chest pain, dyspnea and distress (prolonged)   Phenazopyridine Rash   Pyridium [phenazopyridine Hcl] Rash   Sulfa Antibiotics Rash   Other reaction(s): HIVES   Tamsulosin Nausea And Vomiting      Medication List        Accurate as of 04/09/17  3:14 PM. Always use your most recent med list.          amLODipine 2.5 MG tablet Commonly known as:  NORVASC Take 1 tablet by mouth every morning.   aspirin EC 81 MG tablet Take 1 tablet by mouth every morning.   CALCIUM 600 600 MG Tabs tablet Generic drug:  calcium carbonate Take 1 tablet by mouth every morning.   omeprazole 20 MG capsule Commonly known as:  PRILOSEC Take 1 capsule by mouth every morning.   potassium citrate 10 MEQ (1080 MG) SR tablet Commonly known as:  UROCIT-K Take 1 tablet (10 mEq total) by mouth 2 (two) times daily.   pravastatin 40 MG tablet Commonly known as:  PRAVACHOL Take 1 tablet by  mouth every evening.   V-R VITAMIN B-12 500 MCG tablet Generic drug:  cyanocobalamin Take 1 tablet by mouth every morning.       Allergies:  Allergies  Allergen Reactions  . Lexiscan [Regadenoson]     Severe chest pain, dyspnea and distress (prolonged)  . Phenazopyridine Rash  . Pyridium [Phenazopyridine Hcl] Rash  . Sulfa Antibiotics Rash    Other reaction(s): HIVES  . Tamsulosin Nausea And Vomiting    Family History: Family History  Problem Relation Age of Onset  . Heart attack Mother 87  . Heart disease Mother   . Hypertension Mother   . Hyperlipidemia Mother     Social History:  reports that  has never  smoked. she has never used smokeless tobacco. She reports that she does not drink alcohol or use drugs.  ROS: UROLOGY Frequent Urination?: No Hard to postpone urination?: No Burning/pain with urination?: No Get up at night to urinate?: No Leakage of urine?: No Urine stream starts and stops?: No Trouble starting stream?: No Do you have to strain to urinate?: No Blood in urine?: No Urinary tract infection?: No Sexually transmitted disease?: No Injury to kidneys or bladder?: No Painful intercourse?: No Weak stream?: No Currently pregnant?: No Vaginal bleeding?: No Last menstrual period?: n  Gastrointestinal Nausea?: No Vomiting?: No Indigestion/heartburn?: No Diarrhea?: No Constipation?: No  Constitutional Fever: No Night sweats?: No Weight loss?: No Fatigue?: No  Skin Skin rash/lesions?: No Itching?: No  Eyes Blurred vision?: No Double vision?: No  Ears/Nose/Throat Sore throat?: No   Physical Exam: There were no vitals taken for this visit.  Constitutional:  Alert and oriented, No acute distress. HEENT: [place] AT, moist mucus membranes.  Trachea midline, no masses. Cardiovascular: No clubbing, cyanosis, or edema. Respiratory: Normal respiratory effort, no increased work of breathing. GI: Abdomen is soft, nontender, nondistended, no abdominal masses GU: No CVA tenderness.  Skin: No rashes, bruises or suspicious lesions. Lymph: No cervical or inguinal adenopathy. Neurologic: Grossly intact, no focal deficits, moving all 4 extremities. Psychiatric: Normal mood and affect.  Laboratory Data: Lab Results  Component Value Date   WBC 6.0 09/11/2014   HGB 13.5 09/11/2014   HCT 40.6 09/11/2014   MCV 92.2 09/11/2014   PLT 263 09/11/2014    Lab Results  Component Value Date   CREATININE 0.80 03/30/2017     Pertinent Imaging: CT was reviewed.  No renal mass noted.  Bilateral nonobstructing renal calculi.   Assessment & Plan:   No renal mass identified and  bilateral nephrolithiasis.  1. Nephrolithiasis Stable nephrolithiasis.  Continue annual follow-up.  - Abdomen 1 view (KUB); Future    Abbie Sons, [place] [address], [place], [place] [address] [phone]

## 2017-04-10 ENCOUNTER — Other Ambulatory Visit: Payer: Self-pay | Admitting: Internal Medicine

## 2017-04-10 ENCOUNTER — Telehealth: Payer: Self-pay | Admitting: Urology

## 2017-04-10 DIAGNOSIS — Z1231 Encounter for screening mammogram for malignant neoplasm of breast: Secondary | ICD-10-CM

## 2017-04-10 NOTE — Telephone Encounter (Signed)
-----   Message from Abbie Sons, MD sent at 04/09/2017  3:21 PM EST ----- I had recommended a 17-month follow-up with a KUB when I saw her today.  Can bump out to 1 year.  Please call and let her know that after I reviewed her chart her calculi are stable and we can do yearly.

## 2017-04-10 NOTE — Telephone Encounter (Signed)
Done ° ° °Karen Liu °

## 2017-04-12 ENCOUNTER — Encounter: Payer: Self-pay | Admitting: Urology

## 2017-04-28 ENCOUNTER — Ambulatory Visit
Admission: RE | Admit: 2017-04-28 | Discharge: 2017-04-28 | Disposition: A | Discharge status: Home / Self Care | Payer: Medicare Other | Source: Ambulatory Visit | Attending: Internal Medicine | Admitting: Internal Medicine

## 2017-04-28 DIAGNOSIS — Z1231 Encounter for screening mammogram for malignant neoplasm of breast: Secondary | ICD-10-CM | POA: Diagnosis not present

## 2017-05-26 ENCOUNTER — Encounter: Payer: Self-pay | Admitting: Cardiovascular Disease

## 2017-05-26 ENCOUNTER — Ambulatory Visit (INDEPENDENT_AMBULATORY_CARE_PROVIDER_SITE_OTHER): Payer: Medicare Other | Admitting: Cardiovascular Disease

## 2017-05-26 VITALS — BP 118/60 | HR 78 | Ht 66.0 in | Wt 134.8 lb

## 2017-05-26 DIAGNOSIS — E785 Hyperlipidemia, unspecified: Secondary | ICD-10-CM | POA: Diagnosis not present

## 2017-05-26 DIAGNOSIS — I10 Essential (primary) hypertension: Secondary | ICD-10-CM

## 2017-05-26 DIAGNOSIS — I2510 Atherosclerotic heart disease of native coronary artery without angina pectoris: Secondary | ICD-10-CM

## 2017-05-26 NOTE — Patient Instructions (Signed)
Medication Instructions: Continue same medications.   Labwork: None.   Procedures/Testing: None.   Follow-Up: 1 year with Dr. Orli Degrave.   Any Additional Special Instructions Will Be Listed Below (If Applicable).     If you need a refill on your cardiac medications before your next appointment, please call your pharmacy.   

## 2017-05-26 NOTE — Progress Notes (Signed)
Cardiology Office Note   Date:  05/26/2017   ID:  Karen Liu, DOB [birth year], MRN [id number]  PCP:  Baxter Hire, MD  Cardiologist:   Kathlyn Sacramento, MD   Chief Complaint  Patient presents with  . OTHER    12 month f/u no complaints today. Meds reviewed verbally with pt.      History of Present Illness: Karen Liu is a 84 y.o. female who presents for a follow-up visit regarding moderate nonobstructive one-vessel coronary artery disease. 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 She is not a smoker. Her mother had myocardial infarction in her early 78s. Cardiac catheterization in July 2016 showed 50% mid LAD stenosis which was not significant by FFR (0.9) and mild left circumflex/RCA disease. EF and LVEDP were both normal. This was done after symptoms of chest pain and abnormal nuclear stress test. She was treated medically.  She has been doing extremely well with no chest pain, shortness of breath or palpitations.  No hospitalizations over the last year.  Past Medical History:  Diagnosis Date  . Arthritis    neck  . Brain tumor ([place])   . 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 Essential hypertension   . GERD (gastroesophageal reflux disease)   . History of kidney stones   . Hyperlipidemia   . Hypertension   . Motion sickness    cars  . PONV (postoperative nausea and vomiting)   . Vertigo    before brain surgery    Past Surgical History:  Procedure Laterality Date  . ABDOMINAL HYSTERECTOMY    . APPENDECTOMY    . BRAIN SURGERY  brain tumor removed 2013  . CARDIAC CATHETERIZATION N/A 09/21/2014   Procedure: Left Heart Cath and Coronary Angiography;  Surgeon: Wellington Hampshire, MD;  Location: [place] [facility];  Service: Cardiovascular;  Laterality: N/A;  . CATARACT EXTRACTION W/ INTRAOCULAR LENS  IMPLANT, BILATERAL    . 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 COLONOSCOPY    . ESOPHAGOGASTRODUODENOSCOPY    . ESOPHAGOGASTRODUODENOSCOPY (EGD) WITH PROPOFOL N/A 11/13/2014   Procedure: ESOPHAGOGASTRODUODENOSCOPY (EGD) WITH PROPOFOL;  Surgeon: Manya Silvas, MD;  Location: [facility] ENDOSCOPY;  Service: Endoscopy;  Laterality: N/A;  . MICROLARYNGOSCOPY N/A 11/22/2014   Procedure: MICROLARYNGOSCOPY DIRECT WITH REMOVAL EPIGLOTTIC CYST;  Surgeon: Beverly Gust, MD;  Location: [place];  Service: ENT;  Laterality: N/A;     Current Outpatient Medications  Medication Sig Dispense Refill  . amLODipine (NORVASC) 2.5 MG tablet Take 1 tablet by mouth every morning.    Marland Kitchen aspirin EC 81 MG tablet Take 1 tablet by mouth every morning.    . calcium carbonate (CALCIUM 600) 600 MG TABS tablet Take 1 tablet by mouth every morning.    . cyanocobalamin (V-R VITAMIN B-12) 500 MCG tablet Take 1 tablet by mouth every morning.    Marland Kitchen omeprazole (PRILOSEC) 20 MG capsule Take 1 capsule by mouth every morning.    . potassium citrate (UROCIT-K) 10 MEQ (1080 MG) SR tablet Take 1 tablet (10 mEq total) by mouth 2 (two) times daily. 180 tablet 3  . pravastatin (PRAVACHOL) 40 MG tablet Take 1 tablet by mouth every evening.     No current facility-administered medications for this visit.     Allergies:   Lexiscan [regadenoson]; Phenazopyridine; Pyridium [phenazopyridine hcl]; Sulfa antibiotics; and Tamsulosin    Social History:  The patient  reports that she has never smoked. She has never used smokeless tobacco. She reports that she does not drink alcohol or use drugs.   Family History:  The patient's family history includes Heart attack (age of onset: 66) in her mother; Heart disease in her mother; Hyperlipidemia in her mother; Hypertension in her mother.    ROS:  Please see the history of present illness.   Otherwise, review of systems are positive for none.   All other systems are reviewed and negative.    PHYSICAL EXAM: VS:  BP 118/60 (BP Location: Left Arm,  Patient Position: Sitting, Cuff Size: Normal)   Pulse 78   Ht 5\' 6"  (1.676 m)   Wt 134 lb 12 oz (61.1 kg)   BMI 21.75 kg/m  , BMI Body mass index is 21.75 kg/m. GEN: Well nourished, well developed, in no acute distress  HEENT: normal  Neck: no JVD, carotid bruits, or masses Cardiac: RRR; no murmurs, rubs, or gallops,no edema  Respiratory:  clear to auscultation bilaterally, normal work of breathing GI: soft, nontender, nondistended, + BS MS: no deformity or atrophy  Skin: warm and dry, no rash Neuro:  Strength and sensation are intact Psych: euthymic mood, full affect   EKG:  EKG is ordered today. The ekg ordered today demonstrates normal sinus rhythm with no significant ST or T wave changes.   Recent Labs: 03/30/2017: Creatinine, Ser 0.80    Lipid Panel No results found for: CHOL, TRIG, HDL, CHOLHDL, VLDL, LDLCALC, LDLDIRECT    Wt Readings from Last 3 Encounters:  05/26/17 134 lb 12 oz (61.1 kg)  02/19/17 130 lb (59 kg)  04/29/16 134 lb (60.8 kg)      Other studies Reviewed: Additional studies/ records that were reviewed today include: Lipid profile from October 2016 which showed a total cholesterol of 152, HDL of 54, triglyceride of 50 to and an LDL of 87.    ASSESSMENT AND PLAN:  1.  Coronary artery disease involving native coronary arteries of native heart without angina: Previous cardiac catheterization showed 50% LAD stenosis which was not significant by pressure wire interrogation.  Continue low-dose aspirin and healthy lifestyle.  2. Hyperlipidemia: I reviewed most recent lipid profile in October which showed an LDL of 84 with triglyceride of 54.  I think it is reasonable to continue with pravastatin for now considering her age.  3.  Essential hypertension: Blood pressure is well controlled on amlodipine.    Disposition:   FU with me in 1 year  Signed, Kathlyn Sacramento, MD  05/26/2017 3:35 PM    [place]

## 2017-09-20 ENCOUNTER — Other Ambulatory Visit: Payer: Self-pay

## 2017-09-20 ENCOUNTER — Observation Stay
Admission: EM | Admit: 2017-09-20 | Discharge: 2017-09-22 | Disposition: A | Discharge status: Home / Self Care | Payer: Medicare Other | Attending: Family Medicine | Admitting: Family Medicine

## 2017-09-20 ENCOUNTER — Emergency Department: Payer: Medicare Other

## 2017-09-20 DIAGNOSIS — I200 Unstable angina: Secondary | ICD-10-CM | POA: Diagnosis present

## 2017-09-20 DIAGNOSIS — Z79899 Other long term (current) drug therapy: Secondary | ICD-10-CM | POA: Insufficient documentation

## 2017-09-20 DIAGNOSIS — I10 Essential (primary) hypertension: Secondary | ICD-10-CM | POA: Insufficient documentation

## 2017-09-20 DIAGNOSIS — I25110 Atherosclerotic heart disease of native coronary artery with unstable angina pectoris: Principal | ICD-10-CM | POA: Insufficient documentation

## 2017-09-20 DIAGNOSIS — Z8673 Personal history of transient ischemic attack (TIA), and cerebral infarction without residual deficits: Secondary | ICD-10-CM

## 2017-09-20 DIAGNOSIS — N200 Calculus of kidney: Secondary | ICD-10-CM | POA: Insufficient documentation

## 2017-09-20 DIAGNOSIS — Z9842 Cataract extraction status, left eye: Secondary | ICD-10-CM | POA: Insufficient documentation

## 2017-09-20 DIAGNOSIS — Z955 Presence of coronary angioplasty implant and graft: Secondary | ICD-10-CM | POA: Diagnosis not present

## 2017-09-20 DIAGNOSIS — K5730 Diverticulosis of large intestine without perforation or abscess without bleeding: Secondary | ICD-10-CM | POA: Insufficient documentation

## 2017-09-20 DIAGNOSIS — Z90710 Acquired absence of both cervix and uterus: Secondary | ICD-10-CM | POA: Insufficient documentation

## 2017-09-20 DIAGNOSIS — R079 Chest pain, unspecified: Secondary | ICD-10-CM | POA: Diagnosis present

## 2017-09-20 DIAGNOSIS — Z882 Allergy status to sulfonamides status: Secondary | ICD-10-CM | POA: Diagnosis not present

## 2017-09-20 DIAGNOSIS — Z87442 Personal history of urinary calculi: Secondary | ICD-10-CM | POA: Diagnosis present

## 2017-09-20 DIAGNOSIS — Z9841 Cataract extraction status, right eye: Secondary | ICD-10-CM | POA: Insufficient documentation

## 2017-09-20 DIAGNOSIS — Z9889 Other specified postprocedural states: Secondary | ICD-10-CM | POA: Insufficient documentation

## 2017-09-20 DIAGNOSIS — Z8249 Family history of ischemic heart disease and other diseases of the circulatory system: Secondary | ICD-10-CM | POA: Insufficient documentation

## 2017-09-20 DIAGNOSIS — Z7982 Long term (current) use of aspirin: Secondary | ICD-10-CM | POA: Diagnosis not present

## 2017-09-20 DIAGNOSIS — M1990 Unspecified osteoarthritis, unspecified site: Secondary | ICD-10-CM | POA: Insufficient documentation

## 2017-09-20 DIAGNOSIS — I2510 Atherosclerotic heart disease of native coronary artery without angina pectoris: Secondary | ICD-10-CM | POA: Diagnosis present

## 2017-09-20 DIAGNOSIS — I700 Atherosclerosis of aorta: Secondary | ICD-10-CM | POA: Insufficient documentation

## 2017-09-20 DIAGNOSIS — E785 Hyperlipidemia, unspecified: Secondary | ICD-10-CM | POA: Diagnosis not present

## 2017-09-20 DIAGNOSIS — Z888 Allergy status to other drugs, medicaments and biological substances status: Secondary | ICD-10-CM | POA: Diagnosis not present

## 2017-09-20 DIAGNOSIS — K219 Gastro-esophageal reflux disease without esophagitis: Secondary | ICD-10-CM | POA: Diagnosis not present

## 2017-09-20 HISTORY — DX: Personal history of transient ischemic attack (TIA), and cerebral infarction without residual deficits: Z86.73

## 2017-09-20 NOTE — ED Provider Notes (Signed)
[facility] Emergency Department Provider Note  ____________________________________________   First MD Initiated Contact with Patient 09/20/17 2346     (approximate)  I have reviewed the triage vital signs and the nursing notes.   HISTORY  Chief Complaint Chest Pain   HPI Karen Liu is a 84 y.o. female comes to the emergency department via EMS with substernal pressure-like chest pain radiating to her back that began suddenly at 1 PM roughly 11 hours ago.  Some new swelling in bilateral lower extremities recently.  Her pain is pressure-like and crushing in her chest.  Worsened with exertion and improved with rest.  Some nausea but no vomiting.  She does have a known history of one-vessel coronary artery disease but no history of CHF.   No history of DVT or pulmonary embolism.  No recent surgery travel or immobilization.  The pain was sudden onset and moderate to severe at first.  No abdominal pain or vomiting.  Past Medical History:  Diagnosis Date  . Arthritis    neck  . Brain tumor ([place])   . 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 Essential hypertension   . GERD (gastroesophageal reflux disease)   . History of kidney stones   . Hyperlipidemia   . Hypertension   . Motion sickness    cars  . PONV (postoperative nausea and vomiting)   . Vertigo    before brain surgery    Patient Active Problem List   Diagnosis Date Noted  . Chest pain 09/21/2017  . Coronary artery disease involving native coronary artery of native heart without angina pectoris 04/29/2016  . History of nephrolithiasis 12/20/2014  . Chest tightness 09/14/2014  . Essential hypertension   . Hyperlipidemia   . GERD (gastroesophageal reflux disease) 06/23/2014  . 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 Cerebellar hemorrhage ([place]) 01/02/2011    Past Surgical History:  Procedure Laterality Date  . ABDOMINAL HYSTERECTOMY    . APPENDECTOMY    . BRAIN SURGERY  brain tumor  removed 2013  . CARDIAC CATHETERIZATION N/A 09/21/2014   Procedure: Left Heart Cath and Coronary Angiography;  Surgeon: Wellington Hampshire, MD;  Location: [place] [facility];  Service: Cardiovascular;  Laterality: N/A;  . CATARACT EXTRACTION W/ INTRAOCULAR LENS  IMPLANT, BILATERAL    . 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 COLONOSCOPY    . ESOPHAGOGASTRODUODENOSCOPY    . ESOPHAGOGASTRODUODENOSCOPY (EGD) WITH PROPOFOL N/A 11/13/2014   Procedure: ESOPHAGOGASTRODUODENOSCOPY (EGD) WITH PROPOFOL;  Surgeon: Manya Silvas, MD;  Location: [facility] ENDOSCOPY;  Service: Endoscopy;  Laterality: N/A;  . MICROLARYNGOSCOPY N/A 11/22/2014   Procedure: MICROLARYNGOSCOPY DIRECT WITH REMOVAL EPIGLOTTIC CYST;  Surgeon: Beverly Gust, MD;  Location: [place];  Service: ENT;  Laterality: N/A;    Prior to Admission medications   Medication Sig Start Date End Date Taking? Authorizing Provider  amLODipine (NORVASC) 2.5 MG tablet Take 1 tablet by mouth every morning. 12/16/13  Yes [provider]  aspirin EC 81 MG tablet Take 1 tablet by mouth every morning.   Yes [provider]  calcium-vitamin D (OSCAL WITH D) 500-200 MG-UNIT tablet Take 2 tablets by mouth daily with breakfast.   Yes [provider]  cyanocobalamin (V-R VITAMIN B-12) 500 MCG tablet Take 1,000 mcg by mouth every morning.  02/18/11  Yes [provider]  omeprazole (PRILOSEC) 40 MG capsule Take 1 capsule by mouth daily as needed.  12/16/13  Yes [provider]  potassium citrate (UROCIT-K) 10 MEQ (1080 MG) SR tablet Take 1 tablet (10 mEq total) by mouth 2 (two) times daily. 02/19/17  Yes Stoioff, Ronda Fairly, MD  pravastatin (PRAVACHOL) 40 MG tablet Take 1 tablet by mouth every evening. 12/16/13  Yes [provider]  calcium carbonate (CALCIUM 600) 600 MG TABS tablet Take 1 tablet by mouth every morning. 02/18/11   [provider]     [place] [regadenoson]; Phenazopyridine; Pyridium [phenazopyridine hcl]; Sulfa antibiotics; and Tamsulosin  Family History  Problem Relation Age of Onset  . Heart attack Mother 31  . Heart disease Mother   . Hypertension Mother   . Hyperlipidemia Mother   . Breast cancer Neg Hx     Social History Social History   Tobacco Use  . Smoking status: Never Smoker  . Smokeless tobacco: Never Used  Substance Use Topics  . Alcohol use: No  . Drug use: No    Review of Systems Constitutional: No fever/chills Eyes: No visual changes. ENT: No sore throat. Cardiovascular: Positive for chest pain. Respiratory: Positive for shortness of breath. Gastrointestinal: No abdominal pain.  Positive for nausea, no vomiting.  No diarrhea.  No constipation. Genitourinary: Negative for dysuria. Musculoskeletal: Negative for back pain. Skin: Negative for rash. Neurological: Negative for headaches, focal weakness or numbness.   ____________________________________________   PHYSICAL EXAM:  VITAL SIGNS: ED Triage Vitals  Enc Vitals Group     BP      Pulse      Resp      Temp      Temp src      SpO2      Weight      Height      Head Circumference      Peak Flow      Pain Score      Pain Loc      Pain Edu?      Excl. in [place]?     Constitutional: Alert and oriented x4 appears somewhat uncomfortable although nontoxic no diaphoresis Eyes: PERRL EOMI. Head: Atraumatic. Nose: No congestion/rhinnorhea. Mouth/Throat: No trismus Neck: No stridor.  Able to lie completely flat Cardiovascular: Normal rate, regular rhythm. Grossly normal heart sounds.  Good peripheral circulation. Respiratory: Normal respiratory effort.  No retractions. Lungs CTAB and moving good air Gastrointestinal: Soft nontender Musculoskeletal: 1+ pitting edema bilateral lower extremities legs are equal in size Neurologic:  Normal speech and language. No gross focal neurologic deficits are  appreciated. Skin:  Skin is warm, dry and intact. No rash noted. Psychiatric: Mood and affect are normal. Speech and behavior are normal.    ____________________________________________   DIFFERENTIAL includes but not limited to  Acute coronary syndrome, pulmonary embolism, heart failure ____________________________________________   LABS (all labs ordered are listed, but only abnormal results are displayed)  Labs Reviewed  COMPREHENSIVE METABOLIC PANEL - Abnormal; Notable for the following components:      Result Value   Glucose, Bld 115 (*)    All other components within normal limits  LIPASE, BLOOD  BRAIN NATRIURETIC PEPTIDE  TROPONIN I  CBC WITH DIFFERENTIAL/PLATELET  TROPONIN I  TROPONIN I    Lab work reviewed by me with no signs of acute ischemia x1 __________________________________________  EKG ED ECG REPORT I, Darel Hong, the attending physician, personally viewed and interpreted this ECG.  Date: 09/20/2017 EKG Time:  Rate: 69 Rhythm: normal sinus rhythm QRS Axis: normal Intervals: normal ST/T Wave abnormalities: Inferior ST depression concerning for ischemia Narrative Interpretation: Concerning for subendocardial  ischemia  ____________________________________________  RADIOLOGY  CT angiogram of the chest reviewed by me with no acute disease ____________________________________________   PROCEDURES  Procedure(s) performed: no  Procedures  Critical Care performed: no  ____________________________________________   INITIAL IMPRESSION / ASSESSMENT AND PLAN / ED COURSE  Pertinent labs & imaging results that were available during my care of the patient were reviewed by me and considered in my medical decision making (see chart for details).   The patient arrives uncomfortable appearing with a prehospital EKG showing diffuse ST depression inferiorly and laterally along with exertional chest pain raising concern for acute coronary syndrome.   Her pain did began suddenly at 1 PM and radiated straight to her back.  She does have a history of hypertension and this is also concerning for aortic dissection.  Defer further anticoagulation now until I have a CT angiogram of her chest.     Fortunately the patient's CT angiogram is negative.  At this point I do believe she requires inpatient admission given her typical chest pain and known coronary artery disease. ____________________________________________   FINAL CLINICAL IMPRESSION(S) / ED DIAGNOSES  Final diagnoses:  Chest pain, unspecified type      NEW MEDICATIONS STARTED DURING THIS VISIT:  Current Discharge Medication List       Note:  This document was prepared using Dragon voice recognition software and may include unintentional dictation errors.     Darel Hong, MD 09/21/17 [phone]

## 2017-09-21 ENCOUNTER — Encounter
Admission: EM | Disposition: A | Discharge status: Home / Self Care | Payer: Self-pay | Source: Home / Self Care | Attending: Emergency Medicine

## 2017-09-21 ENCOUNTER — Encounter: Payer: Self-pay | Admitting: Radiology

## 2017-09-21 ENCOUNTER — Emergency Department: Payer: Medicare Other

## 2017-09-21 DIAGNOSIS — R079 Chest pain, unspecified: Secondary | ICD-10-CM | POA: Diagnosis present

## 2017-09-21 DIAGNOSIS — I25110 Atherosclerotic heart disease of native coronary artery with unstable angina pectoris: Secondary | ICD-10-CM | POA: Diagnosis not present

## 2017-09-21 DIAGNOSIS — I200 Unstable angina: Secondary | ICD-10-CM

## 2017-09-21 HISTORY — PX: LEFT HEART CATH: CATH118248

## 2017-09-21 HISTORY — PX: CORONARY STENT INTERVENTION: CATH118234

## 2017-09-21 LAB — CBC WITH DIFFERENTIAL/PLATELET
Basophils Absolute: 0 10*3/uL (ref 0–0.1)
Basophils Relative: 1 %
EOS PCT: 4 %
Eosinophils Absolute: 0.3 10*3/uL (ref 0–0.7)
HEMATOCRIT: 37.3 % (ref 35.0–47.0)
Hemoglobin: 12.8 g/dL (ref 12.0–16.0)
LYMPHS ABS: 2.5 10*3/uL (ref 1.0–3.6)
LYMPHS PCT: 37 %
MCH: 31.8 pg (ref 26.0–34.0)
MCHC: 34.4 g/dL (ref 32.0–36.0)
MCV: 92.6 fL (ref 80.0–100.0)
MONOS PCT: 11 %
Monocytes Absolute: 0.7 10*3/uL (ref 0.2–0.9)
Neutro Abs: 3.2 10*3/uL (ref 1.4–6.5)
Neutrophils Relative %: 47 %
PLATELETS: 240 10*3/uL (ref 150–440)
RBC: 4.02 MIL/uL (ref 3.80–5.20)
RDW: 12.6 % (ref 11.5–14.5)
WBC: 6.8 10*3/uL (ref 3.6–11.0)

## 2017-09-21 LAB — TROPONIN I: Troponin I: <0.03 ng/mL (ref <0.03)

## 2017-09-21 LAB — POCT ACTIVATED CLOTTING TIME: Activated Clotting Time: 323 seconds

## 2017-09-21 LAB — COMPREHENSIVE METABOLIC PANEL
ALBUMIN: 3.8 g/dL (ref 3.5–5.0)
ALT: 13 U/L (ref 0–44)
ANION GAP: 8 (ref 5–15)
AST: 19 U/L (ref 15–41)
Alkaline Phosphatase: 56 U/L (ref 38–126)
BUN: 19 mg/dL (ref 8–23)
CHLORIDE: 106 mmol/L (ref 98–111)
CO2: 27 mmol/L (ref 22–32)
Calcium: 9.1 mg/dL (ref 8.9–10.3)
Creatinine, Ser: 0.8 mg/dL (ref 0.44–1.00)
Glucose, Bld: 115 mg/dL — ABNORMAL HIGH (ref 70–99)
POTASSIUM: 3.6 mmol/L (ref 3.5–5.1)
Sodium: 141 mmol/L (ref 135–145)
Total Bilirubin: 0.4 mg/dL (ref 0.3–1.2)
Total Protein: 7.1 g/dL (ref 6.5–8.1)

## 2017-09-21 LAB — LIPASE, BLOOD: Lipase: 32 U/L (ref 11–51)

## 2017-09-21 LAB — BRAIN NATRIURETIC PEPTIDE: B Natriuretic Peptide: 44 pg/mL (ref 0.0–100.0)

## 2017-09-21 SURGERY — LEFT HEART CATH
Anesthesia: Moderate Sedation | Laterality: Right

## 2017-09-21 MED ORDER — ASPIRIN 81 MG PO CHEW
CHEWABLE_TABLET | ORAL | Status: AC
  Filled 2017-09-21: qty 3

## 2017-09-21 MED ORDER — POTASSIUM CITRATE ER 10 MEQ (1080 MG) PO TBCR
10.0000 meq | EXTENDED_RELEASE_TABLET | Freq: Two times a day (BID) | ORAL | Status: DC
  Administered 2017-09-21 – 2017-09-22 (×3): 10 meq via ORAL
  Filled 2017-09-21 (×4): qty 1

## 2017-09-21 MED ORDER — SODIUM CHLORIDE 0.9% FLUSH
3.0000 mL | Freq: Two times a day (BID) | INTRAVENOUS | Status: DC
  Administered 2017-09-22: 3 mL via INTRAVENOUS

## 2017-09-21 MED ORDER — SODIUM CHLORIDE 0.9 % WEIGHT BASED INFUSION: 1.0000 mL/kg/h | INTRAVENOUS | Status: DC

## 2017-09-21 MED ORDER — NITROGLYCERIN 1 MG/10 ML FOR IR/CATH LAB
INTRA_ARTERIAL | Status: DC | PRN
  Administered 2017-09-21 (×2): 200 ug via INTRACORONARY

## 2017-09-21 MED ORDER — CARVEDILOL 3.125 MG PO TABS
3.1250 mg | ORAL_TABLET | Freq: Two times a day (BID) | ORAL | Status: DC
  Administered 2017-09-21 – 2017-09-22 (×2): 3.125 mg via ORAL
  Filled 2017-09-21 (×2): qty 1

## 2017-09-21 MED ORDER — CLOPIDOGREL BISULFATE 75 MG PO TABS
ORAL_TABLET | ORAL | Status: AC
  Filled 2017-09-21: qty 8

## 2017-09-21 MED ORDER — SODIUM CHLORIDE 0.9 % IV SOLN: 250.0000 mL | INTRAVENOUS | Status: DC | PRN

## 2017-09-21 MED ORDER — VERAPAMIL HCL 2.5 MG/ML IV SOLN
INTRAVENOUS | Status: AC
  Filled 2017-09-21: qty 2

## 2017-09-21 MED ORDER — ENOXAPARIN SODIUM 40 MG/0.4ML ~~LOC~~ SOLN: 40.0000 mg | SUBCUTANEOUS | Status: DC

## 2017-09-21 MED ORDER — MORPHINE SULFATE (PF) 2 MG/ML IV SOLN: 2.0000 mg | INTRAVENOUS | Status: DC | PRN

## 2017-09-21 MED ORDER — SODIUM CHLORIDE 0.9 % WEIGHT BASED INFUSION
3.0000 mL/kg/h | INTRAVENOUS | Status: DC
  Administered 2017-09-21: 3 mL/kg/h via INTRAVENOUS

## 2017-09-21 MED ORDER — ACETAMINOPHEN 325 MG PO TABS: 650.0000 mg | ORAL_TABLET | ORAL | Status: DC | PRN

## 2017-09-21 MED ORDER — LACTATED RINGERS IV SOLN: INTRAVENOUS | Status: DC

## 2017-09-21 MED ORDER — SODIUM CHLORIDE 0.9% FLUSH: 3.0000 mL | INTRAVENOUS | Status: DC | PRN

## 2017-09-21 MED ORDER — VITAMIN B-12 1000 MCG PO TABS
1000.0000 ug | ORAL_TABLET | Freq: Every morning | ORAL | Status: DC
  Administered 2017-09-21 – 2017-09-22 (×2): 1000 ug via ORAL
  Filled 2017-09-21 (×2): qty 1

## 2017-09-21 MED ORDER — LIDOCAINE HCL (PF) 1 % IJ SOLN
INTRAMUSCULAR | Status: AC
  Filled 2017-09-21: qty 30

## 2017-09-21 MED ORDER — IOPAMIDOL (ISOVUE-370) INJECTION 76%
75.0000 mL | Freq: Once | INTRAVENOUS | Status: AC | PRN
  Administered 2017-09-21: 75 mL via INTRAVENOUS

## 2017-09-21 MED ORDER — CLOPIDOGREL BISULFATE 75 MG PO TABS
75.0000 mg | ORAL_TABLET | Freq: Every day | ORAL | Status: DC
  Administered 2017-09-22: 75 mg via ORAL
  Filled 2017-09-21: qty 1

## 2017-09-21 MED ORDER — ASPIRIN 81 MG PO CHEW
CHEWABLE_TABLET | ORAL | Status: DC | PRN
  Administered 2017-09-21: 243 mg via ORAL

## 2017-09-21 MED ORDER — BISACODYL 5 MG PO TBEC: 5.0000 mg | DELAYED_RELEASE_TABLET | Freq: Every day | ORAL | Status: DC | PRN

## 2017-09-21 MED ORDER — VERAPAMIL HCL 2.5 MG/ML IV SOLN
INTRAVENOUS | Status: DC | PRN
  Administered 2017-09-21: 2.5 mg via INTRA_ARTERIAL

## 2017-09-21 MED ORDER — NITROGLYCERIN 0.4 MG SL SUBL: 0.4000 mg | SUBLINGUAL_TABLET | SUBLINGUAL | Status: DC | PRN

## 2017-09-21 MED ORDER — PANTOPRAZOLE SODIUM 40 MG PO TBEC: 40.0000 mg | DELAYED_RELEASE_TABLET | Freq: Every day | ORAL | Status: DC | PRN

## 2017-09-21 MED ORDER — ONDANSETRON HCL 4 MG/2ML IJ SOLN: 4.0000 mg | Freq: Four times a day (QID) | INTRAMUSCULAR | Status: DC | PRN

## 2017-09-21 MED ORDER — HEPARIN SODIUM (PORCINE) 1000 UNIT/ML IJ SOLN
INTRAMUSCULAR | Status: DC | PRN
  Administered 2017-09-21: 3000 [IU] via INTRAVENOUS
  Administered 2017-09-21: 4000 [IU] via INTRAVENOUS

## 2017-09-21 MED ORDER — PRAVASTATIN SODIUM 40 MG PO TABS: 40.0000 mg | ORAL_TABLET | Freq: Every evening | ORAL | Status: DC

## 2017-09-21 MED ORDER — LIDOCAINE HCL (PF) 1 % IJ SOLN
INTRAMUSCULAR | Status: DC | PRN
  Administered 2017-09-21: 2 mL via INTRADERMAL

## 2017-09-21 MED ORDER — LACTATED RINGERS IV SOLN
INTRAVENOUS | Status: AC
  Administered 2017-09-21: 06:00:00 via INTRAVENOUS

## 2017-09-21 MED ORDER — HEPARIN SODIUM (PORCINE) 1000 UNIT/ML IJ SOLN
INTRAMUSCULAR | Status: AC
  Filled 2017-09-21: qty 1

## 2017-09-21 MED ORDER — MORPHINE SULFATE (PF) 4 MG/ML IV SOLN: 4.0000 mg | INTRAVENOUS | Status: DC | PRN

## 2017-09-21 MED ORDER — ATORVASTATIN CALCIUM 20 MG PO TABS: 40.0000 mg | ORAL_TABLET | Freq: Every day | ORAL | Status: DC

## 2017-09-21 MED ORDER — CALCIUM CARBONATE-VITAMIN D 500-200 MG-UNIT PO TABS
2.0000 | ORAL_TABLET | Freq: Every day | ORAL | Status: DC
Start: 2017-09-21 — End: 2017-09-22
  Administered 2017-09-21 – 2017-09-22 (×2): 2 via ORAL
  Filled 2017-09-21 (×2): qty 2

## 2017-09-21 MED ORDER — CLOPIDOGREL BISULFATE 75 MG PO TABS
ORAL_TABLET | ORAL | Status: DC | PRN
  Administered 2017-09-21: 600 mg via ORAL

## 2017-09-21 MED ORDER — SODIUM CHLORIDE 0.9 % WEIGHT BASED INFUSION
1.0000 mL/kg/h | INTRAVENOUS | Status: AC
  Administered 2017-09-21: 1 mL/kg/h via INTRAVENOUS

## 2017-09-21 MED ORDER — FAMOTIDINE IN NACL 20-0.9 MG/50ML-% IV SOLN
20.0000 mg | Freq: Once | INTRAVENOUS | Status: AC
  Administered 2017-09-21: 20 mg via INTRAVENOUS
  Filled 2017-09-21: qty 50

## 2017-09-21 MED ORDER — SENNOSIDES-DOCUSATE SODIUM 8.6-50 MG PO TABS: 1.0000 | ORAL_TABLET | Freq: Every evening | ORAL | Status: DC | PRN

## 2017-09-21 MED ORDER — ASPIRIN 81 MG PO CHEW
81.0000 mg | CHEWABLE_TABLET | Freq: Every day | ORAL | Status: DC
  Administered 2017-09-21 – 2017-09-22 (×2): 81 mg via ORAL
  Filled 2017-09-21 (×2): qty 1

## 2017-09-21 MED ORDER — NITROGLYCERIN 5 MG/ML IV SOLN
INTRAVENOUS | Status: AC
  Filled 2017-09-21: qty 10

## 2017-09-21 SURGICAL SUPPLY — 16 items
BALLN TREK RX 2.5X12 (BALLOONS) ×4
BALLN ~~LOC~~ TREK RX 3.25X12 (BALLOONS) ×4
BALLOON TREK RX 2.5X12 (BALLOONS) ×2 IMPLANT
BALLOON ~~LOC~~ TREK RX 3.25X12 (BALLOONS) ×2 IMPLANT
CATH INFINITI 5FR JK (CATHETERS) ×4 IMPLANT
CATH VISTA GUIDE 6FR JR4 (CATHETERS) ×4 IMPLANT
DEVICE INFLAT 30 PLUS (MISCELLANEOUS) ×4 IMPLANT
DEVICE RAD TR BAND REGULAR (VASCULAR PRODUCTS) ×4 IMPLANT
KIT MANI 3VAL PERCEP (MISCELLANEOUS) ×4 IMPLANT
NEEDLE PERC 21GX4CM (NEEDLE) ×4 IMPLANT
PACK CARDIAC CATH (CUSTOM PROCEDURE TRAY) ×4 IMPLANT
SHEATH RAIN RADIAL 21G 6FR (SHEATH) ×4 IMPLANT
STENT RESOLUTE ONYX 3.0X26 (Permanent Stent) ×4 IMPLANT
WIRE HITORQ VERSACORE ST 145CM (WIRE) ×4 IMPLANT
WIRE INTUITION PROPEL ST 180CM (WIRE) ×4 IMPLANT
WIRE ROSEN-J .035X260CM (WIRE) ×4 IMPLANT

## 2017-09-21 NOTE — Care Management Obs Status (Signed)
MEDICARE OBSERVATION STATUS NOTIFICATION   Patient Details  Name: Karen Liu MRN: [id number] Date of Birth: 1931/02/09   Medicare Observation Status Notification Given:  Yes    Rayna Brenner A Avangeline Stockburger, RN 09/21/2017, 8:10 AM

## 2017-09-21 NOTE — Progress Notes (Signed)
Pt returned from cath lab. Rt radial access site clean dry. Good pulse  Felt. Vss. Dinner tray ordered. Report received from [place]

## 2017-09-21 NOTE — Consult Note (Addendum)
Cardiology Consultation:   Patient ID: Karen Liu; [id number]; [birth year]   Admit date: 09/20/2017 Date of Consult: 09/21/2017  Primary Care Provider: Baxter Hire, MD Primary Cardiologist: Kathlyn Sacramento, MD    Patient Profile:   Karen Liu is a 84 y.o. female with a hx of moderate CAD who is being seen today for the evaluation of chest pain at the request of Dr Jerelyn Charles.  History of Present Illness:   [name] is a pleasant 84 y/o widow who lives on a [place] acre farm, her daughter and son in law live on the property as well. She has a history of moderate CAD, HTN, GERD, and HLD.She is very active, does her own housework and drives. She had a Myoview in July 2016 that was abnormal with significant ST depression and chest pain that lasted 30 minutes. Her images though showed no ischemia and normal LVF. Diagnostic cath done a week later revealed a 50% mLAD, 30%CFX, 20% RCA. She has done well since, LOV was March 2019.   Sunday 09/20/17 she was standing at the sink washing dishes when she developed mid sternal chest pain that radiated to her back and across her shoulders. She denies nausea or vomiting, diaphoresis, or tachycardia, but admits she was SOB. She sat down and her symptoms resolved over 15 minutes.She rested the rest of the day. When she was walking to her bedroom last night she again had SSCP, "thought I was going to die". She called EMS and was transported to [facility]. In the ED her Troponin has been negative x 3, CTA negative for dissection, and no EKG changes were noted. Since admission she says she gets recurrent chest pain just getting up to the rest room. She admits that she has noticed some DOE in the last two weeks, especially when vacuuming.   Past Medical History:  Diagnosis Date  . Arthritis    neck  . Brain tumor ([place])   . 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 Essential hypertension   . GERD (gastroesophageal reflux disease)   . History of kidney stones   .  Hyperlipidemia   . Hypertension   . Motion sickness    cars  . PONV (postoperative nausea and vomiting)   . Vertigo    before brain surgery    Past Surgical History:  Procedure Laterality Date  . ABDOMINAL HYSTERECTOMY    . APPENDECTOMY    . BRAIN SURGERY  brain tumor removed 2013  . CARDIAC CATHETERIZATION N/A 09/21/2014   Procedure: Left Heart Cath and Coronary Angiography;  Surgeon: Wellington Hampshire, MD;  Location: [place] [facility];  Service: Cardiovascular;  Laterality: N/A;  . CATARACT EXTRACTION W/ INTRAOCULAR LENS  IMPLANT, BILATERAL    . 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 COLONOSCOPY    . ESOPHAGOGASTRODUODENOSCOPY    . ESOPHAGOGASTRODUODENOSCOPY (EGD) WITH PROPOFOL N/A 11/13/2014   Procedure: ESOPHAGOGASTRODUODENOSCOPY (EGD) WITH PROPOFOL;  Surgeon: Manya Silvas, MD;  Location: [facility] ENDOSCOPY;  Service: Endoscopy;  Laterality: N/A;  . MICROLARYNGOSCOPY N/A 11/22/2014   Procedure: MICROLARYNGOSCOPY DIRECT WITH REMOVAL EPIGLOTTIC CYST;  Surgeon: Beverly Gust, MD;  Location: [facility];  Service: ENT;  Laterality: N/A;     Home Medications:  Prior to Admission medications   Medication Sig Start Date End Date Taking? Authorizing Provider  amLODipine (NORVASC) 2.5 MG tablet Take 1 tablet by mouth every morning. 12/16/13  Yes [provider]  aspirin EC 81 MG tablet  Take 1 tablet by mouth every morning.   Yes [provider]  calcium-vitamin D (OSCAL WITH D) 500-200 MG-UNIT tablet Take 2 tablets by mouth daily with breakfast.   Yes [provider]  cyanocobalamin (V-R VITAMIN B-12) 500 MCG tablet Take 1,000 mcg by mouth every morning.  02/18/11  Yes [provider]  omeprazole (PRILOSEC) 40 MG capsule Take 1 capsule by mouth daily as needed.  12/16/13  Yes [provider]  potassium citrate (UROCIT-K) 10 MEQ (1080 MG) SR tablet Take 1 tablet (10 mEq total) by mouth 2 (two) times  daily. 02/19/17  Yes Stoioff, Ronda Fairly, MD  pravastatin (PRAVACHOL) 40 MG tablet Take 1 tablet by mouth every evening. 12/16/13  Yes [provider]  calcium carbonate (CALCIUM 600) 600 MG TABS tablet Take 1 tablet by mouth every morning. 02/18/11   [provider]    Inpatient Medications: Scheduled Meds: . aspirin  81 mg Oral Daily  . calcium-vitamin D  2 tablet Oral Q breakfast  . carvedilol  3.125 mg Oral BID WC  . enoxaparin (LOVENOX) injection  40 mg Subcutaneous Q24H  . potassium citrate  10 mEq Oral BID  . pravastatin  40 mg Oral QPM  . vitamin B-12  1,000 mcg Oral q morning - 10a   Continuous Infusions: . lactated ringers 125 mL/hr at 09/21/17 0607   PRN Meds: acetaminophen, bisacodyl, morphine injection **OR** morphine injection, nitroGLYCERIN, ondansetron (ZOFRAN) IV, pantoprazole, senna-docusate  Allergies:    Allergies  Allergen Reactions  . Lexiscan [Regadenoson]     Severe chest pain, dyspnea and distress (prolonged)  . Phenazopyridine Rash  . Pyridium [Phenazopyridine Hcl] Rash  . Sulfa Antibiotics Rash    Other reaction(s): HIVES  . Tamsulosin Nausea And Vomiting    Social History:   Social History   Socioeconomic History  . Marital status: Widowed    Spouse name: Not on file  . Number of children: Not on file  . Years of education: Not on file  . Highest education level: Not on file  Occupational History  . Not on file  Social Needs  . Financial resource strain: Not on file  . Food insecurity:    Worry: Not on file    Inability: Not on file  . Transportation needs:    Medical: Not on file    Non-medical: Not on file  Tobacco Use  . Smoking status: Never Smoker  . Smokeless tobacco: Never Used  Substance and Sexual Activity  . Alcohol use: No  . Drug use: No  . Sexual activity: Not on file  Lifestyle  . Physical activity:    Days per week: Not on file    Minutes per session: Not on file  . Stress: Not on file    Relationships  . Social connections:    Talks on phone: Not on file    Gets together: Not on file    Attends religious service: Not on file    Active member of club or organization: Not on file    Attends meetings of clubs or organizations: Not on file    Relationship status: Not on file  . Intimate partner violence:    Fear of current or ex partner: Not on file    Emotionally abused: Not on file    Physically abused: Not on file    Forced sexual activity: Not on file  Other Topics Concern  . Not on file  Social History Narrative  . Not on file  Family History:    Family History  Problem Relation Age of Onset  . Heart attack Mother 59  . Heart disease Mother   . Hypertension Mother   . Hyperlipidemia Mother   . Breast cancer Neg Hx      ROS:  Please see the history of present illness.  She has notice some LE edema recently- new for her All other ROS reviewed and negative.     Physical Exam/Data:   Vitals:   09/21/17 0145 09/21/17 0200 09/21/17 0233 09/21/17 0237  BP: (!) 144/72 134/69  (!) 156/76  Pulse: 74 72  82  Resp: 13 15  18   Temp:    98.9 F (37.2 C)  TempSrc:    Oral  SpO2: 98% 99%  99%  Weight:   132 lb (59.9 kg)   Height:   5\' 6"  (1.676 m)     Intake/Output Summary (Last 24 hours) at 09/21/2017 0840 Last data filed at 09/21/2017 7824 Gross per 24 hour  Intake 0 ml  Output -  Net 0 ml   Filed Weights   09/20/17 2354 09/21/17 0233  Weight: 130 lb (59 kg) 132 lb (59.9 kg)   Body mass index is 21.31 kg/m.  General:  Thin caucasian female, in no acute distress HEENT: normal Lymph: no adenopathy Neck: no JVD Endocrine:  No thryomegaly Vascular: No carotid bruits; FA pulses 2+ bilaterally without bruits  Cardiac:  normal S1, S2; RRR; no murmur  Lungs:  clear to auscultation bilaterally, no wheezing, rhonchi or rales  Abd: soft, nontender, no hepatomegaly  Ext: trace edema Musculoskeletal:  No deformities, BUE and BLE strength normal and  equal Skin: warm and dry  Neuro:  CNs 2-12 intact, no focal abnormalities noted Psych:  Normal affect   EKG:  The EKG was personally reviewed and demonstrates:  NSR Telemetry:  Telemetry was personally reviewed and demonstrates:  NSR  Laboratory Data:  Chemistry Recent Labs  Lab 09/20/17 2358  NA 141  K 3.6  CL 106  CO2 27  GLUCOSE 115*  BUN 19  CREATININE 0.80  CALCIUM 9.1  GFRNONAA >60  GFRAA >60  ANIONGAP 8    Recent Labs  Lab 09/20/17 2358  PROT 7.1  ALBUMIN 3.8  AST 19  ALT 13  ALKPHOS 56  BILITOT 0.4   Hematology Recent Labs  Lab 09/20/17 2358  WBC 6.8  RBC 4.02  HGB 12.8  HCT 37.3  MCV 92.6  MCH 31.8  MCHC 34.4  RDW 12.6  PLT 240   Cardiac Enzymes Recent Labs  Lab 09/20/17 2358 09/21/17 0254 09/21/17 0550  TROPONINI <0.03 <0.03 <0.03   No results for input(s): TROPIPOC in the last 168 hours.  BNP Recent Labs  Lab 09/20/17 2358  BNP 44.0    DDimer No results for input(s): DDIMER in the last 168 hours.  Radiology/Studies:  Dg Chest Port 1 View  Result Date: 09/21/2017 CLINICAL DATA:  Initial evaluation for acute chest pain. EXAM: PORTABLE CHEST 1 VIEW COMPARISON:  Prior radiograph from 09/11/2014. FINDINGS: The cardiac and mediastinal silhouettes are stable in size and contour, and remain within normal limits. Aortic atherosclerosis. The lungs are normally inflated. No airspace consolidation, pleural effusion, or pulmonary edema is identified. There is no pneumothorax. No acute osseous abnormality identified. IMPRESSION: 1. No active cardiopulmonary disease. 2. Aortic atherosclerosis. Electronically Signed   By: Jeannine Boga M.D.   On: 09/21/2017 00:17   Ct Angio Chest/abd/pel For Dissection W And/or W/wo  Result Date:  09/21/2017 CLINICAL DATA:  Sudden onset of back pain radiating to the chest EXAM: CT ANGIOGRAPHY CHEST, ABDOMEN AND PELVIS TECHNIQUE: Multidetector CT imaging through the chest, abdomen and pelvis was performed using  the standard protocol during bolus administration of intravenous contrast. Multiplanar reconstructed images and MIPs were obtained and reviewed to evaluate the vascular anatomy. CONTRAST:  73mL ISOVUE-370 IOPAMIDOL (ISOVUE-370) INJECTION 76% COMPARISON:  Chest x-ray 09/20/2017, CT abdomen pelvis 02/18/2014 FINDINGS: CTA CHEST FINDINGS Cardiovascular: Non contrasted images of the chest demonstrate no intramural hematoma. Marked aortic atherosclerosis. Coronary artery calcification. Heart size within normal limits. No pericardial effusion. No aneurysmal dilatation.  No dissection is seen. Mediastinum/Nodes: Midline trachea. No thyroid mass. Prominent AP window lymph nodes measuring up to 8 mm. Precarinal lymph node measuring up to 1 cm. Mild air distention of the esophagus with small hiatal hernia Lungs/Pleura: Biapical fibrosis with pleural calcification. No pleural effusion or pneumothorax. Mild bronchiectasis in the left lower lobe. Linear atelectasis or scarring in the lower lobes. Musculoskeletal: No chest wall abnormality. No acute or significant osseous findings. Review of the MIP images confirms the above findings. CTA ABDOMEN AND PELVIS FINDINGS VASCULAR Aorta: No aneurysm or dissection. Moderate-to-marked aortic atherosclerosis without stenosis. Celiac: Heavily calcified at the origin. 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 Distal vascular patency. SMA: Heavily calcified at the origin. At least mild stenosis at the origin of the SMA. Renals: Calcifications at the origins of both renal arteries. No high-grade stenosis on the right. There may be mild stenosis at the origin of the left renal artery. IMA: Patent without evidence of aneurysm, dissection, vasculitis or significant stenosis. Inflow: Moderate aortic atherosclerosis. No aneurysm dissection or significant stenosis. Review of the MIP images confirms the above findings. NON-VASCULAR Hepatobiliary: No focal hepatic abnormality. No  calcified gallstone or biliary dilatation Pancreas: Unremarkable. No pancreatic ductal dilatation or surrounding inflammatory changes. Spleen: Heterogeneous hypodensities presumably due to arterial phase enhancement. Adrenals/Urinary Tract: Adrenal glands are normal. Nonobstructing stones within both kidneys, measuring up to 4 mm on the right and 4 mm on the left. Prominent renal pelvises. No ureteral stones. The bladder is normal Stomach/Bowel: Stomach is within normal limits. Appendix not well seen. Sigmoid colon diverticular disease without acute inflammatory change. No evidence of bowel wall thickening, distention, or inflammatory changes. Lymphatic: No significantly enlarged lymph nodes Reproductive: Status post hysterectomy. No adnexal masses. Other: Negative for free air or free fluid. Musculoskeletal: No acute or significant osseous findings. Degenerative changes at L5-S1 Review of the MIP images confirms the above findings. IMPRESSION: 1. Negative for acute aortic dissection. Negative for aortic aneurysm. 2. Moderate to marked aortic atherosclerosis. 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 Mild stenosis at the origin of the SMA. 3. No CT evidence for acute intra-abdominal or pelvic abnormality. Sigmoid colon diverticular disease without acute inflammation 4. Nonobstructing kidney stones bilaterally Electronically Signed   By: Donavan Foil M.D.   On: 09/21/2017 00:53    Assessment and Plan:   Chest pain with a moderate risk for ACS Her symptoms sound suspicious though EKG and Troponin all negative so far  CAD Known CAD July 2016- 50% LAD  Lexiscan intolerance Pt did not have a good experience with Lexiscan in 2016- prolonged chest pain post injection  HTN Controlled  HLD On statin Rx  H/O CVA Lt brain CVA 2012  Plan- She will not do a Lexiscan and not sure a coronary CTA is indicated since she has chest pain now with any  exertion, ? Consider diagnostic cath.  She is NPO- will review with MD.   The patient understands that risks included but are not limited to stroke (1 in 1000), death (1 in 1000), kidney failure [usually temporary] (1 in 500), bleeding (1 in 200), allergic reaction [possibly serious] (1 in 200).  The patient understands and agrees to proceed.    For questions or updates, please contact [facility] Please consult [email or web address] for contact info under Cardiology/STEMI.   Angelena Form, PA-C  09/21/2017 8:40 AM

## 2017-09-21 NOTE — Progress Notes (Signed)
Pt has orders for LR @ 152ml/hr, pt has +1 edema to bilateral lower legs. Paged Dr. Jodell Cipro to make sure he wanted to go ahead with the fluids, he gave to okay to give them, states pt only had trace edema in her legs. Will give and continue to monitor. Conley Simmonds, RN, BSN

## 2017-09-21 NOTE — H&P (Signed)
[facility] NAME: Karen Liu    MR#:  [id number]  DATE OF BIRTH:  03-22-1930  DATE OF ADMISSION:  09/20/2017  PRIMARY CARE PHYSICIAN: Baxter Hire, MD   REQUESTING/REFERRING PHYSICIAN: Darel Hong, MD  CHIEF COMPLAINT:   Chief Complaint  Patient presents with  . Chest Pain    HISTORY OF PRESENT ILLNESS:  Karen Liu  is a 84 y.o. female with a known history of HTN, HLD, CAD [09/21/2014 cardiac cath (+) 20% mid-RCA, 50% mid-LAD, 30% mid-LCx; LVEF WNL, LVEDP WNL] p/w typical CP/stable angina. Pt is an excellent historian, AAOx3, hearing/vision/cognition intact, highly functional and independent of all ADLs/IADLs, good medical literacy, appropriate adherence to medication and follow-up. Appears younger than stated age. Followed by Cardiologist Dr. Fletcher Anon, last seen 05/26/2017.  Pt states that @~1300PM on Sun 09/20/2017, pt was standing at the kitchen sink washing dishes, when she began to develop midchest/substernal chest pain/pressure, which she characterized as a sensation of, "an elephant sitting on my chest," though she later said it was not quite as heavy as a fully-sized elephant, so perhaps a smaller elephant. She states the pain radiated to the back between the shoulder blades. She denies radiation to the arm/shoulder/jaw. Pain was non-reproducible w/ palpation, non-pleuritic and non-positional. She denies SOB, palpitations, N/V, diaphoresis, LH/LOC. However, pt states her symptoms are clearly reproduced with exertion. She states she sat down from washing the dishes, w/ improvement. She sat "for a good bit", and then got up to go to the bathroom. She had recurrent symptoms. She rested after using the bathroom, and symptoms improved. She states she stayed at rest for the majority of the afternoon/evening, with the intention of calling Dr. Tyrell Antonio office on Monday 07/22 morning. However, as she was getting ready for bed, she  began to experience chest pain/pressure that was more severe than she had experienced earlier in the day, prompting her to call EMS. Trop-I (-) x2. EKG (-) ST elevations. CTA C/A/P performed in ED (-) aneurysm/dissection. Pt states she feels fine at rest. She is well-appearing, and is not in distress.  Pt endorses 3wk Hx of ankle edema. She states it largely resolves w/ recumbence, but she is worried there is still some minimal edema even whilst recumbent. She does not have a Hx of CHF, and denies orthopnea/PND. She does not exhibit JVD or rales on exam. She had trace ankle edema. I do appreciate a quiet S4 and occasional ectopy on cardiac auscultation. BNP is 44. CXR (-) edema/effusion.  PAST MEDICAL HISTORY:   Past Medical History:  Diagnosis Date  . Arthritis    neck  . Brain tumor ([place])   . 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 Essential hypertension   . GERD (gastroesophageal reflux disease)   . History of kidney stones   . Hyperlipidemia   . Hypertension   . Motion sickness    cars  . PONV (postoperative nausea and vomiting)   . Vertigo    before brain surgery    PAST SURGICAL HISTORY:   Past Surgical History:  Procedure Laterality Date  . ABDOMINAL HYSTERECTOMY    . APPENDECTOMY    . BRAIN SURGERY  brain tumor removed 2013  . CARDIAC CATHETERIZATION N/A 09/21/2014   Procedure: Left Heart Cath and Coronary Angiography;  Surgeon: Wellington Hampshire, MD;  Location: [facility];  Service: Cardiovascular;  Laterality: N/A;  . CATARACT EXTRACTION W/ INTRAOCULAR LENS  IMPLANT,  BILATERAL    . 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 COLONOSCOPY    . ESOPHAGOGASTRODUODENOSCOPY    . ESOPHAGOGASTRODUODENOSCOPY (EGD) WITH PROPOFOL N/A 11/13/2014   Procedure: ESOPHAGOGASTRODUODENOSCOPY (EGD) WITH PROPOFOL;  Surgeon: Manya Silvas, MD;  Location: [facility];  Service: Endoscopy;  Laterality: N/A;  . MICROLARYNGOSCOPY N/A 11/22/2014    Procedure: MICROLARYNGOSCOPY DIRECT WITH REMOVAL EPIGLOTTIC CYST;  Surgeon: Beverly Gust, MD;  Location: [facility];  Service: ENT;  Laterality: N/A;    SOCIAL HISTORY:   Social History   Tobacco Use  . Smoking status: Never Smoker  . Smokeless tobacco: Never Used  Substance Use Topics  . Alcohol use: No    FAMILY HISTORY:   Family History  Problem Relation Age of Onset  . Heart attack Mother 49  . Heart disease Mother   . Hypertension Mother   . Hyperlipidemia Mother   . Breast cancer Neg Hx     DRUG ALLERGIES:   Allergies  Allergen Reactions  . Lexiscan [Regadenoson]     Severe chest pain, dyspnea and distress (prolonged)  . Phenazopyridine Rash  . Pyridium [Phenazopyridine Hcl] Rash  . Sulfa Antibiotics Rash    Other reaction(s): HIVES  . Tamsulosin Nausea And Vomiting    REVIEW OF SYSTEMS:   Review of Systems  Constitutional: Negative for chills, diaphoresis, fever, malaise/fatigue and weight loss.  HENT: Negative for congestion, ear pain, hearing loss, nosebleeds, sinus pain, sore throat and tinnitus.   Eyes: Negative for blurred vision, double vision and photophobia.  Respiratory: Negative for cough, hemoptysis, sputum production, shortness of breath and wheezing.   Cardiovascular: Positive for chest pain and leg swelling. Negative for palpitations, orthopnea, claudication and PND.  Gastrointestinal: Negative for abdominal pain, blood in stool, constipation, diarrhea, heartburn, melena, nausea and vomiting.  Genitourinary: Negative for dysuria, frequency, hematuria and urgency.  Musculoskeletal: Positive for back pain (+) chest pain/pressure radiating to midback. Negative for falls, joint pain, myalgias and neck pain.  Skin: Negative for itching and rash.  Neurological: Negative for dizziness, tingling, tremors, sensory change, speech change, focal weakness, seizures, loss of consciousness, weakness and headaches.  Psychiatric/Behavioral: Negative  for memory loss. The patient does not have insomnia.    MEDICATIONS AT HOME:   Prior to Admission medications   Medication Sig Start Date End Date Taking? Authorizing Provider  amLODipine (NORVASC) 2.5 MG tablet Take 1 tablet by mouth every morning. 12/16/13  Yes [provider]  aspirin EC 81 MG tablet Take 1 tablet by mouth every morning.   Yes [provider]  calcium-vitamin D (OSCAL WITH D) 500-200 MG-UNIT tablet Take 2 tablets by mouth daily with breakfast.   Yes [provider]  cyanocobalamin (V-R VITAMIN B-12) 500 MCG tablet Take 1,000 mcg by mouth every morning.  02/18/11  Yes [provider]  omeprazole (PRILOSEC) 40 MG capsule Take 1 capsule by mouth daily as needed.  12/16/13  Yes [provider]  potassium citrate (UROCIT-K) 10 MEQ (1080 MG) SR tablet Take 1 tablet (10 mEq total) by mouth 2 (two) times daily. 02/19/17  Yes Stoioff, Ronda Fairly, MD  pravastatin (PRAVACHOL) 40 MG tablet Take 1 tablet by mouth every evening. 12/16/13  Yes [provider]  calcium carbonate (CALCIUM 600) 600 MG TABS tablet Take 1 tablet by mouth every morning. 02/18/11   [provider]      VITAL SIGNS:  Blood pressure (!) 156/76, pulse 82, temperature 98.9 F (37.2 C), temperature source  Oral, resp. rate 18, height 5\' 6"  (1.676 m), weight 59.9 kg (132 lb), SpO2 99 %.  PHYSICAL EXAMINATION:  Physical Exam  Constitutional: She is oriented to person, place, and time. She appears well-developed and well-nourished. She is active and cooperative.  Non-toxic appearance. She does not have a sickly appearance. She does not appear ill. No distress. She is not intubated.  HENT:  Head: Normocephalic and atraumatic.  Mouth/Throat: Oropharynx is clear and moist. No oropharyngeal exudate.  Eyes: Conjunctivae, EOM and lids are normal. No scleral icterus.  Neck: Neck supple. No JVD present. No thyromegaly present.  Cardiovascular: Normal rate, regular  rhythm, S1 normal and S2 normal.  Occasional extrasystoles are present. Exam reveals gallop and S4. Exam reveals no S3, no distant heart sounds and no friction rub.  No murmur heard. Pulmonary/Chest: Effort normal and breath sounds normal. No accessory muscle usage or stridor. No apnea, no tachypnea and no bradypnea. She is not intubated. No respiratory distress. She has no decreased breath sounds. She has no wheezes. She has no rhonchi. She has no rales.  Abdominal: Soft. Bowel sounds are normal. She exhibits no distension. There is no tenderness. There is no rebound and no guarding.  Musculoskeletal: Normal range of motion. She exhibits edema (+) trace ankle edema. She exhibits no tenderness.       Right lower leg: Normal. She exhibits edema (+) trace ankle edema. She exhibits no tenderness.       Left lower leg: Normal. She exhibits edema (+) trace ankle edema. She exhibits no tenderness.  Lymphadenopathy:    She has no cervical adenopathy.  Neurological: She is alert and oriented to person, place, and time. She is not disoriented.  Skin: Skin is warm, dry and intact. No rash noted. She is not diaphoretic. No erythema.  Psychiatric: She has a normal mood and affect. Her speech is normal and behavior is normal. Judgment and thought content normal. Her mood appears not anxious. She is not agitated. Cognition and memory are normal.   LABORATORY PANEL:   CBC Recent Labs  Lab 09/20/17 2358  WBC 6.8  HGB 12.8  HCT 37.3  PLT 240   ------------------------------------------------------------------------------------------------------------------  Chemistries  Recent Labs  Lab 09/20/17 2358  NA 141  K 3.6  CL 106  CO2 27  GLUCOSE 115*  BUN 19  CREATININE 0.80  CALCIUM 9.1  AST 19  ALT 13  ALKPHOS 56  BILITOT 0.4   ------------------------------------------------------------------------------------------------------------------  Cardiac Enzymes Recent Labs  Lab 09/21/17 0254    TROPONINI <0.03   ------------------------------------------------------------------------------------------------------------------  RADIOLOGY:  Dg Chest Port 1 View  Result Date: 09/21/2017 CLINICAL DATA:  Initial evaluation for acute chest pain. EXAM: PORTABLE CHEST 1 VIEW COMPARISON:  Prior radiograph from 09/11/2014. FINDINGS: The cardiac and mediastinal silhouettes are stable in size and contour, and remain within normal limits. Aortic atherosclerosis. The lungs are normally inflated. No airspace consolidation, pleural effusion, or pulmonary edema is identified. There is no pneumothorax. No acute osseous abnormality identified. IMPRESSION: 1. No active cardiopulmonary disease. 2. Aortic atherosclerosis. Electronically Signed   By: Jeannine Boga M.D.   On: 09/21/2017 00:17   Ct Angio Chest/abd/pel For Dissection W And/or W/wo  Result Date: 09/21/2017 CLINICAL DATA:  Sudden onset of back pain radiating to the chest EXAM: CT ANGIOGRAPHY CHEST, ABDOMEN AND PELVIS TECHNIQUE: Multidetector CT imaging through the chest, abdomen and pelvis was performed using the standard protocol during bolus administration of intravenous contrast. Multiplanar reconstructed images and MIPs were obtained and reviewed  to evaluate the vascular anatomy. CONTRAST:  2mL ISOVUE-370 IOPAMIDOL (ISOVUE-370) INJECTION 76% COMPARISON:  Chest x-ray 09/20/2017, CT abdomen pelvis 02/18/2014 FINDINGS: CTA CHEST FINDINGS Cardiovascular: Non contrasted images of the chest demonstrate no intramural hematoma. Marked aortic atherosclerosis. Coronary artery calcification. Heart size within normal limits. No pericardial effusion. No aneurysmal dilatation.  No dissection is seen. Mediastinum/Nodes: Midline trachea. No thyroid mass. Prominent AP window lymph nodes measuring up to 8 mm. Precarinal lymph node measuring up to 1 cm. Mild air distention of the esophagus with small hiatal hernia Lungs/Pleura: Biapical fibrosis with pleural  calcification. No pleural effusion or pneumothorax. Mild bronchiectasis in the left lower lobe. Linear atelectasis or scarring in the lower lobes. Musculoskeletal: No chest wall abnormality. No acute or significant osseous findings. Review of the MIP images confirms the above findings. CTA ABDOMEN AND PELVIS FINDINGS VASCULAR Aorta: No aneurysm or dissection. Moderate-to-marked aortic atherosclerosis without stenosis. Celiac: Heavily calcified at the origin. 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 Distal vascular patency. SMA: Heavily calcified at the origin. At least mild stenosis at the origin of the SMA. Renals: Calcifications at the origins of both renal arteries. No high-grade stenosis on the right. There may be mild stenosis at the origin of the left renal artery. IMA: Patent without evidence of aneurysm, dissection, vasculitis or significant stenosis. Inflow: Moderate aortic atherosclerosis. No aneurysm dissection or significant stenosis. Review of the MIP images confirms the above findings. NON-VASCULAR Hepatobiliary: No focal hepatic abnormality. No calcified gallstone or biliary dilatation Pancreas: Unremarkable. No pancreatic ductal dilatation or surrounding inflammatory changes. Spleen: Heterogeneous hypodensities presumably due to arterial phase enhancement. Adrenals/Urinary Tract: Adrenal glands are normal. Nonobstructing stones within both kidneys, measuring up to 4 mm on the right and 4 mm on the left. Prominent renal pelvises. No ureteral stones. The bladder is normal Stomach/Bowel: Stomach is within normal limits. Appendix not well seen. Sigmoid colon diverticular disease without acute inflammatory change. No evidence of bowel wall thickening, distention, or inflammatory changes. Lymphatic: No significantly enlarged lymph nodes Reproductive: Status post hysterectomy. No adnexal masses. Other: Negative for free air or free fluid. Musculoskeletal: No acute or significant osseous  findings. Degenerative changes at L5-S1 Review of the MIP images confirms the above findings. IMPRESSION: 1. Negative for acute aortic dissection. Negative for aortic aneurysm. 2. Moderate to marked aortic atherosclerosis. 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 Mild stenosis at the origin of the SMA. 3. No CT evidence for acute intra-abdominal or pelvic abnormality. Sigmoid colon diverticular disease without acute inflammation 4. Nonobstructing kidney stones bilaterally Electronically Signed   By: Donavan Foil M.D.   On: 09/21/2017 00:53   IMPRESSION AND PLAN:   A/P: 65F w/ HTN + HLD p/w stable angina, w/ known Hx CAD (+) 50% LAD stenosis as of 2016 cardiac cath. Hyperglycemia. -Tele, continuous cardiac monitoring -Trop-I (-) x2, rpt pending -EKG (-) ST elevation -ASA81 -c/w Statin -Not on beta blocker at admission -D/C Norvasc, start Coreg at lowest dose -Not on ACE-I/ARB at present -Supplemental O2 -Morphine, NTG PRN -Stable angina, typical symptoms -Cardiology consult [facility]), will likely benefit from cardiac cath -Received IV contrast in ED for CTA; IVF -c/w home meds -Cardiac diet -Lovenox -Full code -Observation, < 2 midnights   All the records are reviewed and case discussed with ED provider. Management plans discussed with the patient, family and they are in agreement.  CODE STATUS: Full code  TOTAL TIME TAKING CARE OF THIS PATIENT: 60 minutes.  Arta Silence M.D on 09/21/2017 at 4:45 AM  Between 7am to 6pm - Pager - [phone]  After 6pm go to [email or web address] - Proofreader  [facility]  Office  [phone]  CC: Primary care physician; Baxter Hire, MD   Note: This dictation was prepared with Dragon dictation along with smaller phrase technology. Any transcriptional errors that result from this process are unintentional.

## 2017-09-21 NOTE — ED Triage Notes (Signed)
EMS pt to rm 24 from home with report of sudden on set of back pain that radiates into her midsternal chest pain. Pt also reports swelling in her bilateral lower extremities that started a few weeks ago.

## 2017-09-21 NOTE — Progress Notes (Signed)
Pt. Had slight ooze at Right radial site after TR band removed. Manual pressure held to site x 10 min. Site redressed. No complications noted. Update given to RN on floor. VSS. Stable for transfer to floor.

## 2017-09-21 NOTE — Care Management Note (Signed)
Case Management Note  Patient Details  Name: Karen Liu MRN: [id number] Date of Birth: 1930/12/31  Subjective/Objective:  Patient admitted to [facility] under observation status for chest pain. RNCM consulted on patient to provide MOON letter and complete assessment. Patient currently lives alone and is able to complete all activities if daily living without any assistance. Patient has daughter Karen Liu [phone] who lives on the same property but in a different home and is available if the patient has any needs. Patient still drives. Uses no DME. PCP is Dr Karen Liu with whom she sees regularly. Uses [facility] in [place] and obtains medications without issue.                 Action/Plan: RNCM to continue to follow for any needs.   Expected Discharge Date:                  Expected Discharge Plan:     In-House Referral:     Discharge planning Services     Post Acute Care Choice:    Choice offered to:     DME Arranged:    DME Agency:     HH Arranged:    HH Agency:     Status of Service:     If discussed at [employer], dates discussed:    Additional Comments:  Karen Liu A Karen Evelyn, RN 09/21/2017, 8:15 AM

## 2017-09-21 NOTE — Plan of Care (Signed)
  Problem: Pain Managment: Goal: General experience of comfort will improve Outcome: Progressing   Problem: Safety: Goal: Ability to remain free from injury will improve Outcome: Progressing   Problem: Activity: Goal: Ability to tolerate increased activity will improve Outcome: Progressing   

## 2017-09-22 ENCOUNTER — Encounter: Payer: Self-pay | Admitting: Cardiovascular Disease

## 2017-09-22 ENCOUNTER — Telehealth: Payer: Self-pay | Admitting: Cardiovascular Disease

## 2017-09-22 DIAGNOSIS — I200 Unstable angina: Secondary | ICD-10-CM

## 2017-09-22 DIAGNOSIS — I25110 Atherosclerotic heart disease of native coronary artery with unstable angina pectoris: Secondary | ICD-10-CM | POA: Diagnosis not present

## 2017-09-22 LAB — BASIC METABOLIC PANEL
Anion gap: 5 (ref 5–15)
BUN: 13 mg/dL (ref 8–23)
CALCIUM: 8.9 mg/dL (ref 8.9–10.3)
CO2: 28 mmol/L (ref 22–32)
CREATININE: 0.82 mg/dL (ref 0.44–1.00)
Chloride: 109 mmol/L (ref 98–111)
GFR calc Af Amer: >60 mL/min (ref >60)
Glucose, Bld: 95 mg/dL (ref 70–99)
Potassium: 3.8 mmol/L (ref 3.5–5.1)
Sodium: 142 mmol/L (ref 135–145)

## 2017-09-22 LAB — CBC
HCT: 36.8 % (ref 35.0–47.0)
Hemoglobin: 12.8 g/dL (ref 12.0–16.0)
MCH: 32.2 pg (ref 26.0–34.0)
MCHC: 34.8 g/dL (ref 32.0–36.0)
MCV: 92.4 fL (ref 80.0–100.0)
PLATELETS: 241 10*3/uL (ref 150–440)
RBC: 3.98 MIL/uL (ref 3.80–5.20)
RDW: 12.5 % (ref 11.5–14.5)
WBC: 7.1 10*3/uL (ref 3.6–11.0)

## 2017-09-22 MED ORDER — CLOPIDOGREL BISULFATE 75 MG PO TABS: 75.0000 mg | ORAL_TABLET | Freq: Every day | ORAL | 6 refills | Status: DC

## 2017-09-22 MED ORDER — ATORVASTATIN CALCIUM 40 MG PO TABS: 40.0000 mg | ORAL_TABLET | Freq: Every day | ORAL | 6 refills | Status: DC

## 2017-09-22 MED ORDER — CARVEDILOL 3.125 MG PO TABS: 3.1250 mg | ORAL_TABLET | Freq: Two times a day (BID) | ORAL | 6 refills | Status: DC

## 2017-09-22 MED ORDER — NITROGLYCERIN 0.4 MG SL SUBL: 0.4000 mg | SUBLINGUAL_TABLET | SUBLINGUAL | 3 refills | Status: DC | PRN

## 2017-09-22 NOTE — Telephone Encounter (Signed)
Nothing else needed

## 2017-09-22 NOTE — Care Management (Signed)
Is not being discharged home on cost prohibitive antiplatelet medication.  No discharge needs identified by members of the care team  

## 2017-09-22 NOTE — Progress Notes (Signed)
Progress Note  Patient Name: DENESHA BROUSE Date of Encounter: 09/22/2017  Primary Cardiologist: Kathlyn Sacramento, MD  Subjective   No c/p or sob.  Ambulating w/o difficulty/symptoms.    Inpatient Medications    Scheduled Meds: . aspirin  81 mg Oral Daily  . atorvastatin  40 mg Oral q1800  . calcium-vitamin D  2 tablet Oral Q breakfast  . carvedilol  3.125 mg Oral BID WC  . clopidogrel  75 mg Oral Q breakfast  . enoxaparin (LOVENOX) injection  40 mg Subcutaneous Q24H  . potassium citrate  10 mEq Oral BID  . sodium chloride flush  3 mL Intravenous Q12H  . vitamin B-12  1,000 mcg Oral q morning - 10a   Continuous Infusions: . sodium chloride     PRN Meds: sodium chloride, acetaminophen, bisacodyl, morphine injection **OR** morphine injection, nitroGLYCERIN, ondansetron (ZOFRAN) IV, pantoprazole, senna-docusate, sodium chloride flush   Vital Signs    Vitals:   09/21/17 1849 09/21/17 1929 09/22/17 0318 09/22/17 0757  BP: 123/81 (!) 123/58 121/71 126/69  Pulse: 81 80 75 72  Resp: 16 19 20 14   Temp: 98.4 F (36.9 C) 98.5 F (36.9 C) 97.8 F (36.6 C)   TempSrc: Oral Oral Oral   SpO2: 98% 95% 97% 97%  Weight:      Height:        Intake/Output Summary (Last 24 hours) at 09/22/2017 1010 Last data filed at 09/22/2017 0730 Gross per 24 hour  Intake 605.98 ml  Output 1350 ml  Net -744.02 ml   Filed Weights   09/20/17 2354 09/21/17 0233  Weight: 130 lb (59 kg) 132 lb (59.9 kg)    Physical Exam   GEN: Well nourished, well developed, in no acute distress.  HEENT: Grossly normal.  Neck: Supple, no JVD, carotid bruits, or masses. Cardiac: RRR, no murmurs, rubs, or gallops. No clubbing, cyanosis, edema.  Radials/DP/PT 2+ and equal bilaterally. R wrist cath site w/o bleeding/bruit/hematoma. Respiratory:  Respirations regular and unlabored, clear to auscultation bilaterally. GI: Soft, nontender, nondistended, BS + x 4. MS: no deformity or atrophy. Skin: warm and dry, no  rash. Neuro:  Strength and sensation are intact. Psych: AAOx3.  Normal affect.  Labs    Chemistry Recent Labs  Lab 09/20/17 2358 09/22/17 0408  NA 141 142  K 3.6 3.8  CL 106 109  CO2 27 28  GLUCOSE 115* 95  BUN 19 13  CREATININE 0.80 0.82  CALCIUM 9.1 8.9  PROT 7.1  --   ALBUMIN 3.8  --   AST 19  --   ALT 13  --   ALKPHOS 56  --   BILITOT 0.4  --   GFRNONAA >60 >60  GFRAA >60 >60  ANIONGAP 8 5     Hematology Recent Labs  Lab 09/20/17 2358 09/22/17 0408  WBC 6.8 7.1  RBC 4.02 3.98  HGB 12.8 12.8  HCT 37.3 36.8  MCV 92.6 92.4  MCH 31.8 32.2  MCHC 34.4 34.8  RDW 12.6 12.5  PLT 240 241    Cardiac Enzymes Recent Labs  Lab 09/20/17 2358 09/21/17 0254 09/21/17 0550  TROPONINI <0.03 <0.03 <0.03   No results for input(s): TROPIPOC in the last 168 hours.   BNP Recent Labs  Lab 09/20/17 2358  BNP 44.0     Radiology    Dg Chest Port 1 View  Result Date: 09/21/2017 CLINICAL DATA:  Initial evaluation for acute chest pain. EXAM: PORTABLE CHEST 1 VIEW COMPARISON:  Prior  radiograph from 09/11/2014. FINDINGS: The cardiac and mediastinal silhouettes are stable in size and contour, and remain within normal limits. Aortic atherosclerosis. The lungs are normally inflated. No airspace consolidation, pleural effusion, or pulmonary edema is identified. There is no pneumothorax. No acute osseous abnormality identified. IMPRESSION: 1. No active cardiopulmonary disease. 2. Aortic atherosclerosis. Electronically Signed   By: Jeannine Boga M.D.   On: 09/21/2017 00:17   Ct Angio Chest/abd/pel For Dissection W And/or W/wo  Result Date: 09/21/2017 CLINICAL DATA:  Sudden onset of back pain radiating to the chest EXAM: CT ANGIOGRAPHY CHEST, ABDOMEN AND PELVIS TECHNIQUE: Multidetector CT imaging through the chest, abdomen and pelvis was performed using the standard protocol during bolus administration of intravenous contrast. Multiplanar reconstructed images and MIPs were  obtained and reviewed to evaluate the vascular anatomy. CONTRAST:  20mL ISOVUE-370 IOPAMIDOL (ISOVUE-370) INJECTION 76% COMPARISON:  Chest x-ray 09/20/2017, CT abdomen pelvis 02/18/2014 FINDINGS: CTA CHEST FINDINGS Cardiovascular: Non contrasted images of the chest demonstrate no intramural hematoma. Marked aortic atherosclerosis. Coronary artery calcification. Heart size within normal limits. No pericardial effusion. No aneurysmal dilatation.  No dissection is seen. Mediastinum/Nodes: Midline trachea. No thyroid mass. Prominent AP window lymph nodes measuring up to 8 mm. Precarinal lymph node measuring up to 1 cm. Mild air distention of the esophagus with small hiatal hernia Lungs/Pleura: Biapical fibrosis with pleural calcification. No pleural effusion or pneumothorax. Mild bronchiectasis in the left lower lobe. Linear atelectasis or scarring in the lower lobes. Musculoskeletal: No chest wall abnormality. No acute or significant osseous findings. Review of the MIP images confirms the above findings. CTA ABDOMEN AND PELVIS FINDINGS VASCULAR Aorta: No aneurysm or dissection. Moderate-to-marked aortic atherosclerosis without stenosis. Celiac: Heavily calcified at the origin. 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 Distal vascular patency. SMA: Heavily calcified at the origin. At least mild stenosis at the origin of the SMA. Renals: Calcifications at the origins of both renal arteries. No high-grade stenosis on the right. There may be mild stenosis at the origin of the left renal artery. IMA: Patent without evidence of aneurysm, dissection, vasculitis or significant stenosis. Inflow: Moderate aortic atherosclerosis. No aneurysm dissection or significant stenosis. Review of the MIP images confirms the above findings. NON-VASCULAR Hepatobiliary: No focal hepatic abnormality. No calcified gallstone or biliary dilatation Pancreas: Unremarkable. No pancreatic ductal dilatation or surrounding inflammatory  changes. Spleen: Heterogeneous hypodensities presumably due to arterial phase enhancement. Adrenals/Urinary Tract: Adrenal glands are normal. Nonobstructing stones within both kidneys, measuring up to 4 mm on the right and 4 mm on the left. Prominent renal pelvises. No ureteral stones. The bladder is normal Stomach/Bowel: Stomach is within normal limits. Appendix not well seen. Sigmoid colon diverticular disease without acute inflammatory change. No evidence of bowel wall thickening, distention, or inflammatory changes. Lymphatic: No significantly enlarged lymph nodes Reproductive: Status post hysterectomy. No adnexal masses. Other: Negative for free air or free fluid. Musculoskeletal: No acute or significant osseous findings. Degenerative changes at L5-S1 Review of the MIP images confirms the above findings. IMPRESSION: 1. Negative for acute aortic dissection. Negative for aortic aneurysm. 2. Moderate to marked aortic atherosclerosis. 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 Mild stenosis at the origin of the SMA. 3. No CT evidence for acute intra-abdominal or pelvic abnormality. Sigmoid colon diverticular disease without acute inflammation 4. Nonobstructing kidney stones bilaterally Electronically Signed   By: Donavan Foil M.D.   On: 09/21/2017 00:53    Telemetry    rsr - Personally Reviewed  Cardiac Studies   Cardiac Catheterization and Percutaneous Coronary Intervention 7.22.2019  Left Main  Vessel is angiographically normal.  Left Anterior Descending  Mid LAD lesion 40% stenosed  Mid LAD lesion is 40% stenosed.  First Diagonal Branch  Vessel is large in size. Vessel is angiographically normal.  Third Diagonal Branch  Vessel is small in size. The vessel exhibits minimal luminal irregularities.  Left Circumflex  Mid Cx lesion 30% stenosed  Mid Cx lesion is 30% stenosed.  First Obtuse Marginal Branch  Vessel is angiographically normal.  Second Obtuse Marginal Branch   Vessel is angiographically normal.  Third Obtuse Marginal Branch  Vessel is angiographically normal.  Right Coronary Artery  Vessel is angiographically normal.  Ost RCA lesion 30% stenosed  Ost RCA lesion is 30% stenosed. The lesion is moderately calcified.  Mid RCA lesion 99% stenosed  Mid RCA lesion is 99% stenosed. Vessel is the culprit lesion. The lesion is type C..      Treated w/ 3/0x26 Resolute DES  Right Posterior Descending Artery  Vessel is angiographically normal.  Right Posterior Atrioventricular Branch  Vessel is small in size. Vessel is angiographically normal.  First Right Posterolateral  The vessel exhibits minimal luminal irregularities.  Second Right Posterolateral  The vessel exhibits minimal luminal irregularities.   Left Ventricle The left ventricular size is normal. The left ventricular systolic function is normal. LV end diastolic pressure is mildly elevated. The left ventricular ejection fraction is 55-65% by visual estimate. No regional wall motion abnormalities.  _____________   Patient Profile      84 y.o. female with a hx of moderate CAD, HTN, HL, GERD, who presented with progressive chest pain and is now s/p PCI.  Assessment & Plan    1.  CAD/USA:  S/p cath and PCI/DES to the RCA on 7/23.  No chest pain or dyspnea overnight.  Ambulating without difficulty.  Plan to d/c today. Cont asa/plavix (12 months),  blocker.  We will arrange for early f/u in the next 2 wks.  2.  Essential HTN: stable on  blocker.  3.  HL:  Cont statin.  No recent lipids.  Will need outpt f/u.  Signed, Murray Hodgkins, NP  09/22/2017, 10:10 AM    For questions or updates, please contact   Please consult [email or web address] for contact info under Cardiology/STEMI.

## 2017-09-22 NOTE — Telephone Encounter (Signed)
-----   Message from Theora Gianotti, NP sent at 09/22/2017 11:54 AM EDT ----- H,  Hello again.  Another 2 wk f/u s/p PCI. No TCM. End pt.  She can see anyone of [place].  Thx,  C

## 2017-09-22 NOTE — Telephone Encounter (Signed)
Patient contacted regarding discharge from [facility] on 09/22/17.   Patient understands to follow up with provider ? On 10/06/17 at 3:30pm at [facility].  Patient understands discharge instructions? Yes  Patient understands medications and regiment? Yes  Patient understands to bring all medications to this visit? Yes

## 2017-09-22 NOTE — Discharge Summary (Signed)
[facility] [place] NAME: Karen Liu    MR#:  [id number]  DATE OF BIRTH:  15-Jun-1930  DATE OF ADMISSION:  09/20/2017 ADMITTING PHYSICIAN: Gorden Harms, MD  DATE OF DISCHARGE: No discharge date for patient encounter.  PRIMARY CARE PHYSICIAN: Baxter Hire, MD    ADMISSION DIAGNOSIS:  Chest pain, unspecified type [R07.9]  DISCHARGE DIAGNOSIS:  Active Problems:   Essential hypertension   Hyperlipidemia   Coronary artery disease involving native coronary artery of native heart without angina pectoris   History of nephrolithiasis   GERD (gastroesophageal reflux disease)   History of CVA (cerebrovascular accident)   Chest pain with moderate risk of acute coronary syndrome   Unstable angina ([place])   SECONDARY DIAGNOSIS:   Past Medical History:  Diagnosis Date  . Arthritis    neck  . Brain tumor ([place])   . 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 Essential hypertension   . GERD (gastroesophageal reflux disease)   . History of CVA (cerebrovascular accident) 01/02/2011   Overview:  Left  . History of kidney stones   . Hyperlipidemia   . Hypertension   . Motion sickness    cars  . PONV (postoperative nausea and vomiting)   . Vertigo    before brain surgery    HOSPITAL COURSE:  *Acute exertional angina Patient did rule out for acute coronary syndrome, underwent heart catheterization on September 21, 2017 noted for right coronary artery greater than 90% stenosis-status post drug-eluting stent placement successfully without complication, treated with aspirin, Plavix, beta-blocker therapy, statin therapy, nitrates as needed, and patient did well.  Patient follow-up with cardiology status post discharge for reevaluation in 2 weeks  *Hypertension Stable on current regiment  *Hyperlipidemia, unspecified Continue statin therapy  DISCHARGE CONDITIONS:   stable  CONSULTS OBTAINED:  Treatment Team:  Arta Silence, MD Wellington Hampshire, MD  DRUG ALLERGIES:   Allergies  Allergen Reactions  . Lexiscan [Regadenoson]     Severe chest pain, dyspnea and distress (prolonged)  . Phenazopyridine Rash  . Pyridium [Phenazopyridine Hcl] Rash  . Sulfa Antibiotics Rash    Other reaction(s): HIVES  . Tamsulosin Nausea And Vomiting    DISCHARGE MEDICATIONS:   Allergies as of 09/22/2017      Reactions   Lexiscan [regadenoson]    Severe chest pain, dyspnea and distress (prolonged)   Phenazopyridine Rash   Pyridium [phenazopyridine Hcl] Rash   Sulfa Antibiotics Rash   Other reaction(s): HIVES   Tamsulosin Nausea And Vomiting      Medication List    STOP taking these medications   pravastatin 40 MG tablet Commonly known as:  PRAVACHOL     TAKE these medications   amLODipine 2.5 MG tablet Commonly known as:  NORVASC Take 1 tablet by mouth every morning.   aspirin EC 81 MG tablet Take 1 tablet by mouth every morning.   atorvastatin 40 MG tablet Commonly known as:  LIPITOR Take 1 tablet (40 mg total) by mouth daily at 6 PM.   CALCIUM 600 600 MG Tabs tablet Generic drug:  calcium carbonate Take 1 tablet by mouth every morning.   calcium-vitamin D 500-200 MG-UNIT tablet Commonly known as:  OSCAL WITH D Take 2 tablets by mouth daily with breakfast.   carvedilol 3.125 MG tablet Commonly known as:  COREG Take 1 tablet (3.125 mg total) by mouth 2 (two) times daily with a meal.   clopidogrel 75 MG tablet Commonly  known as:  PLAVIX Take 1 tablet (75 mg total) by mouth daily with breakfast. Start taking on:  09/23/2017   nitroGLYCERIN 0.4 MG SL tablet Commonly known as:  NITROSTAT Place 1 tablet (0.4 mg total) under the tongue every 5 (five) minutes as needed for chest pain.   omeprazole 40 MG capsule Commonly known as:  PRILOSEC Take 1 capsule by mouth daily as needed.   potassium citrate 10 MEQ (1080 MG) SR tablet Commonly known as:  UROCIT-K Take 1 tablet (10 mEq total) by  mouth 2 (two) times daily.   V-R VITAMIN B-12 500 MCG tablet Generic drug:  vitamin B-12 Take 1,000 mcg by mouth every morning.        DISCHARGE INSTRUCTIONS:  If you experience worsening of your admission symptoms, develop shortness of breath, life threatening emergency, suicidal or homicidal thoughts you must seek medical attention immediately by calling 911 or calling your MD immediately  if symptoms less severe.  You Must read complete instructions/literature along with all the possible adverse reactions/side effects for all the Medicines you take and that have been prescribed to you. Take any new Medicines after you have completely understood and accept all the possible adverse reactions/side effects.   Please note  You were cared for by a hospitalist during your hospital stay. If you have any questions about your discharge medications or the care you received while you were in the hospital after you are discharged, you can call the unit and asked to speak with the hospitalist on call if the hospitalist that took care of you is not available. Once you are discharged, your primary care physician will handle any further medical issues. Please note that NO REFILLS for any discharge medications will be authorized once you are discharged, as it is imperative that you return to your primary care physician (or establish a relationship with a primary care physician if you do not have one) for your aftercare needs so that they can reassess your need for medications and monitor your lab values.    Today   CHIEF COMPLAINT:   Chief Complaint  Patient presents with  . Chest Pain    HISTORY OF PRESENT ILLNESS:  84 y.o. female with a known history of HTN, HLD, CAD [09/21/2014 cardiac cath (+) 20% mid-RCA, 50% mid-LAD, 30% mid-LCx; LVEF WNL, LVEDP WNL] p/w typical CP/stable angina. Pt is an excellent historian, AAOx3, hearing/vision/cognition intact, highly functional and independent of all  ADLs/IADLs, good medical literacy, appropriate adherence to medication and follow-up. Appears younger than stated age. Followed by Cardiologist Dr. Fletcher Anon, last seen 05/26/2017.  Pt states that @~1300PM on Sun 09/20/2017, pt was standing at the kitchen sink washing dishes, when she began to develop midchest/substernal chest pain/pressure, which she characterized as a sensation of, "an elephant sitting on my chest," though she later said it was not quite as heavy as a fully-sized elephant, so perhaps a smaller elephant. She states the pain radiated to the back between the shoulder blades. She denies radiation to the arm/shoulder/jaw. Pain was non-reproducible w/ palpation, non-pleuritic and non-positional. She denies SOB, palpitations, N/V, diaphoresis, LH/LOC. However, pt states her symptoms are clearly reproduced with exertion. She states she sat down from washing the dishes, w/ improvement. She sat "for a good bit", and then got up to go to the bathroom. She had recurrent symptoms. She rested after using the bathroom, and symptoms improved. She states she stayed at rest for the majority of the afternoon/evening, with the intention of calling  Dr. Tyrell Antonio office on Monday 07/22 morning. However, as she was getting ready for bed, she began to experience chest pain/pressure that was more severe than she had experienced earlier in the day, prompting her to call EMS. Trop-I (-) x2. EKG (-) ST elevations. CTA C/A/P performed in ED (-) aneurysm/dissection. Pt states she feels fine at rest. She is well-appearing, and is not in distress.  Pt endorses 3wk Hx of ankle edema. She states it largely resolves w/ recumbence, but she is worried there is still some minimal edema even whilst recumbent. She does not have a Hx of CHF, and denies orthopnea/PND. She does not exhibit JVD or rales on exam. She had trace ankle edema. I do appreciate a quiet S4 and occasional ectopy on cardiac auscultation. BNP is 44. CXR (-)  edema/effusion    VITAL SIGNS:  Blood pressure 126/69, pulse 72, temperature 97.8 F (36.6 C), temperature source Oral, resp. rate 14, height 5\' 6"  (1.676 m), weight 59.9 kg (132 lb), SpO2 97 %.  I/O:    Intake/Output Summary (Last 24 hours) at 09/22/2017 1220 Last data filed at 09/22/2017 1044 Gross per 24 hour  Intake 845.98 ml  Output 350 ml  Net 495.98 ml    PHYSICAL EXAMINATION:  GENERAL:  84 y.o.-year-old patient lying in the bed with no acute distress.  EYES: Pupils equal, round, reactive to light and accommodation. No scleral icterus. Extraocular muscles intact.  HEENT: Head atraumatic, normocephalic. Oropharynx and nasopharynx clear.  NECK:  Supple, no jugular venous distention. No thyroid enlargement, no tenderness.  LUNGS: Normal breath sounds bilaterally, no wheezing, rales,rhonchi or crepitation. No use of accessory muscles of respiration.  CARDIOVASCULAR: S1, S2 normal. No murmurs, rubs, or gallops.  ABDOMEN: Soft, non-tender, non-distended. Bowel sounds present. No organomegaly or mass.  EXTREMITIES: No pedal edema, cyanosis, or clubbing.  NEUROLOGIC: Cranial nerves II through XII are intact. Muscle strength 5/5 in all extremities. Sensation intact. Gait not checked.  PSYCHIATRIC: The patient is alert and oriented x 3.  SKIN: No obvious rash, lesion, or ulcer.   DATA REVIEW:   CBC Recent Labs  Lab 09/22/17 0408  WBC 7.1  HGB 12.8  HCT 36.8  PLT 241    Chemistries  Recent Labs  Lab 09/20/17 2358 09/22/17 0408  NA 141 142  K 3.6 3.8  CL 106 109  CO2 27 28  GLUCOSE 115* 95  BUN 19 13  CREATININE 0.80 0.82  CALCIUM 9.1 8.9  AST 19  --   ALT 13  --   ALKPHOS 56  --   BILITOT 0.4  --     Cardiac Enzymes Recent Labs  Lab 09/21/17 0550  TROPONINI <0.03    Microbiology Results  Results for orders placed or performed in visit on 02/19/17  Microscopic Examination     Status: Abnormal   Collection Time: 02/19/17 10:11 AM  Result Value Ref  Range Status   WBC, UA 11-30 (A) 0 - 5 /hpf Final   RBC, UA None seen 0 - 2 /hpf Final   Epithelial Cells (non renal) 0-10 0 - 10 /hpf Final   Mucus, UA Present (A) Not Estab. Final   Bacteria, UA Moderate (A) None seen/Few Final    RADIOLOGY:  Dg Chest Port 1 View  Result Date: 09/21/2017 CLINICAL DATA:  Initial evaluation for acute chest pain. EXAM: PORTABLE CHEST 1 VIEW COMPARISON:  Prior radiograph from 09/11/2014. FINDINGS: The cardiac and mediastinal silhouettes are stable in size and contour, and remain within  normal limits. Aortic atherosclerosis. The lungs are normally inflated. No airspace consolidation, pleural effusion, or pulmonary edema is identified. There is no pneumothorax. No acute osseous abnormality identified. IMPRESSION: 1. No active cardiopulmonary disease. 2. Aortic atherosclerosis. Electronically Signed   By: Jeannine Boga M.D.   On: 09/21/2017 00:17   Ct Angio Chest/abd/pel For Dissection W And/or W/wo  Result Date: 09/21/2017 CLINICAL DATA:  Sudden onset of back pain radiating to the chest EXAM: CT ANGIOGRAPHY CHEST, ABDOMEN AND PELVIS TECHNIQUE: Multidetector CT imaging through the chest, abdomen and pelvis was performed using the standard protocol during bolus administration of intravenous contrast. Multiplanar reconstructed images and MIPs were obtained and reviewed to evaluate the vascular anatomy. CONTRAST:  17mL ISOVUE-370 IOPAMIDOL (ISOVUE-370) INJECTION 76% COMPARISON:  Chest x-ray 09/20/2017, CT abdomen pelvis 02/18/2014 FINDINGS: CTA CHEST FINDINGS Cardiovascular: Non contrasted images of the chest demonstrate no intramural hematoma. Marked aortic atherosclerosis. Coronary artery calcification. Heart size within normal limits. No pericardial effusion. No aneurysmal dilatation.  No dissection is seen. Mediastinum/Nodes: Midline trachea. No thyroid mass. Prominent AP window lymph nodes measuring up to 8 mm. Precarinal lymph node measuring up to 1 cm. Mild air  distention of the esophagus with small hiatal hernia Lungs/Pleura: Biapical fibrosis with pleural calcification. No pleural effusion or pneumothorax. Mild bronchiectasis in the left lower lobe. Linear atelectasis or scarring in the lower lobes. Musculoskeletal: No chest wall abnormality. No acute or significant osseous findings. Review of the MIP images confirms the above findings. CTA ABDOMEN AND PELVIS FINDINGS VASCULAR Aorta: No aneurysm or dissection. Moderate-to-marked aortic atherosclerosis without stenosis. Celiac: Heavily calcified at the origin. 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 Distal vascular patency. SMA: Heavily calcified at the origin. At least mild stenosis at the origin of the SMA. Renals: Calcifications at the origins of both renal arteries. No high-grade stenosis on the right. There may be mild stenosis at the origin of the left renal artery. IMA: Patent without evidence of aneurysm, dissection, vasculitis or significant stenosis. Inflow: Moderate aortic atherosclerosis. No aneurysm dissection or significant stenosis. Review of the MIP images confirms the above findings. NON-VASCULAR Hepatobiliary: No focal hepatic abnormality. No calcified gallstone or biliary dilatation Pancreas: Unremarkable. No pancreatic ductal dilatation or surrounding inflammatory changes. Spleen: Heterogeneous hypodensities presumably due to arterial phase enhancement. Adrenals/Urinary Tract: Adrenal glands are normal. Nonobstructing stones within both kidneys, measuring up to 4 mm on the right and 4 mm on the left. Prominent renal pelvises. No ureteral stones. The bladder is normal Stomach/Bowel: Stomach is within normal limits. Appendix not well seen. Sigmoid colon diverticular disease without acute inflammatory change. No evidence of bowel wall thickening, distention, or inflammatory changes. Lymphatic: No significantly enlarged lymph nodes Reproductive: Status post hysterectomy. No adnexal masses.  Other: Negative for free air or free fluid. Musculoskeletal: No acute or significant osseous findings. Degenerative changes at L5-S1 Review of the MIP images confirms the above findings. IMPRESSION: 1. Negative for acute aortic dissection. Negative for aortic aneurysm. 2. Moderate to marked aortic atherosclerosis. 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 Mild stenosis at the origin of the SMA. 3. No CT evidence for acute intra-abdominal or pelvic abnormality. Sigmoid colon diverticular disease without acute inflammation 4. Nonobstructing kidney stones bilaterally Electronically Signed   By: Donavan Foil M.D.   On: 09/21/2017 00:53    EKG:   Orders placed or performed during the hospital encounter of 09/20/17  . ED EKG  . ED EKG  . EKG 12-Lead  .  EKG 12-Lead  . EKG 12-Lead immediately post procedure  . EKG 12-Lead  . EKG 12-Lead  . EKG 12-Lead  . EKG 12-Lead immediately post procedure  . EKG 12-Lead      Management plans discussed with the patient, family and they are in agreement.  CODE STATUS:     Code Status Orders  (From admission, onward)        Start     Ordered   09/21/17 0233  Full code  Continuous     09/21/17 0232    Code Status History    Date Active Date Inactive Code Status Order ID Comments User Context   09/21/2014 1111 09/21/2014 1610 Full Code [id number]  Wellington Hampshire, MD Inpatient    Advance Directive Documentation     Most Recent Value  Type of Advance Directive  Healthcare Power of Attorney, Living will  Pre-existing out of facility DNR order (yellow form or pink MOST form)  -  "MOST" Form in Place?  -      TOTAL TIME TAKING CARE OF THIS PATIENT: 45 minutes.    Avel Peace Brayleigh Rybacki M.D on 09/22/2017 at 12:20 PM  Between 7am to 6pm - Pager - [phone]  After 6pm go to [email or web address] - password [email or web address]  Office  [phone]  CC: Primary care physician; Baxter Hire, MD   Note: This  dictation was prepared with Dragon dictation along with smaller phrase technology. Any transcriptional errors that result from this process are unintentional.

## 2017-09-22 NOTE — Progress Notes (Signed)
Cardiovascular and Pulmonary Nurse Navigator Note:    84 year old female with hx of moderate CAD, HTN, HLD, GERD, who presented with progressive chest pain.  Patient underwent cardiac cath which revealed 99% stenosis of mid RCA.  Patient underwent PCI with DES to mid RCA.    "Angioplasty and Stents" booklet  Discussed the definition of CAD. Reviewed the location of CAD and where his stent was placed. Informed patient she will be given a stent card. Explained the purpose of the stent card. Instructed patient to keep stent card in her wallet.  ? Discussed modifiable risk factors including controlling blood pressure, cholesterol, and blood sugar; following heart healthy diet; maintaining healthy weight; exercise; and smoking cessation, if applicable.   ? Discussed cardiac medications including rationale for taking, mechanisms of action, and side effects. Stressed the importance of taking medications as prescribed.  ? Discussed emergency plan for heart attack symptoms. Patient verbalized understanding of need to call 911 and not to drive himself to ER if having cardiac symptoms / chest pain.  ? Heart healthy diet of low sodium, low fat, low cholesterol heart healthy diet discussed. Information on diet provided.  Smoking Cessation - Patient is a NEVER smoker.   ? Exercise - Benefits of exercised discussed. Explained to patient she had been referred to Cardiac Rehab by her cardiologist.    An overview of the program was provided. Brochure, informational letter, class and orientation times, and CPT billing codes given to patient. Patient is interested in participating. Patient plans to check with her insurance company to see what her out-of-pocket expenses will be.  ? Patient appreciative of the information.  ? Roanna Epley, RN, BSN, [place]  [facility] Cardiac & Pulmonary Rehab  Cardiovascular & Pulmonary Nurse Navigator  Direct Line: [phone]  Department Phone #: [phone] Fax:  [phone]  Email Address: [email or web address]

## 2017-09-22 NOTE — Telephone Encounter (Signed)
[facility] for cath Arida  Needs 1 week per 2a   Scheduled 8/6 at 330 pm Ryan (2 wk )

## 2017-10-02 ENCOUNTER — Telehealth: Payer: Self-pay | Admitting: Cardiovascular Disease

## 2017-10-02 NOTE — Telephone Encounter (Signed)
I spoke with the patient and advised her that her symptoms of dizziness & weakness could be related to the coreg. Confirmed she is taking coreg 3.125 mg BID. I have advised her that she can try taking a 1/2 tablet (1.56 mg) BID until she is seen in the office, although this is just a very small dose. She is aware coreg can be readdressed at her office visit with Thurmond Butts, [place] on Tuesday 8/6.  She questioned if plavix could be causing her to feel this way. I advised most likely it is coming from the coreg, but may need to consider a repeat CBC on Tuesday. The patient is agreeable and voices understanding.

## 2017-10-02 NOTE — Telephone Encounter (Signed)
° °  Pt c/o medication issue:  1. Name of Medication: Carvedilol  2. How are you currently taking this medication (dosage and times per day)? 3.125 po BID  3. Are you having a reaction (difficulty breathing--STAT)?      Weakness dizziness  thinks she is taking too much wants to know if she can take this once daily bp running 115/55   4. What is your medication issue? See above

## 2017-10-05 ENCOUNTER — Encounter: Payer: Self-pay | Admitting: Physician Assistant

## 2017-10-05 NOTE — Progress Notes (Signed)
Cardiology Office Note Date:  10/06/2017  Patient ID:  Karen, Liu Sep 14, 1930, MRN [id number] PCP:  Baxter Hire, MD  Cardiologist:  Dr. Fletcher Anon, MD    Chief Complaint: Hospital follow up  History of Present Illness: Karen Liu is a 84 y.o. female with history of CAD with recent PCI to the RCA as below, cerebral hemorrhage in 01/2011, HTN, HLD, and GERD who presents for hospital follow up after recent admission to [facility] from 7/21-7/23 for unstable angina.  Prior LHC in 09/2014 showed 50% mid LAD stenosis which was not significant by FFR (0.9), and mild LCx/RCA disease. Her EF and LVEDP were normal. Medical management was advised. She was admitted to [facility] on 7/21 with unstable angina. Troponin remained negative. EKG was not acute. LHC on 09/21/2017 showed severe one-vessel CAD with 99% stenosis in the mid RCA which was felt to be the culprit for her unstable angina. She underwent successful PCI/DES to the RCA. Remaining LHC showed mid LAD 40% stenosis, mid LCx 30% stenosis, ostial RCA 30% stenosis. Normal LVSF and mildly elevated LVEDP. Post cath labs showed a stable renal function and HGB. She was discharged on ASA 81 mg, Plavix 75 mg, amlodipine 2.5 mg, Lipitor 40 mg, Coreg 3.125 mg bid. SL NTG prn, along with her non-cardiac medications. Discharge weight of 132 pounds.   Recent LDL from 06/2017 of 77.   Patient comes in today doing well from a cardiac perspective however has numerous complaints including hot flashes, intermittent dizziness, and generalized fatigue.  She attributes these symptoms to discontinuation of her amlodipine and starting of carvedilol following her recent PCI as above.  She is taking half of a 3.125 mg carvedilol twice a day on her own accord.  She has yet to start aspirin though reports full compliance with Plavix.  She indicates blood pressure readings from 595 systolic to 638 systolic and states this range is "far too great" for her.  She states she does not  want to take carvedilol any further and wants to go back on amlodipine.  She is agreeable to starting aspirin.  She has not had any falls, BRBPR, or melena.  No orthopnea, early satiety, abdominal distention, or lower extremity swelling.  She has not had any shortness of breath or chest pain.  Past Medical History:  Diagnosis Date  . Arthritis    neck  . Brain tumor ([place])   . CAD (coronary artery disease)    a. LHC 7/16: 50% mLAD not sig by FFR (0.9), mild LCx/RCA dz,EF & LVEDP nl; b. LHC 7/19: mLAD 40%, mLCx 30%, ostRCA 30%, mRCA 99% s/p PCI/DES, nl LVSF, mildly elevated LVEDP  . 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 Essential hypertension   . GERD (gastroesophageal reflux disease)   . History of CVA (cerebrovascular accident) 01/02/2011   Overview:  Left  . History of kidney stones   . Hyperlipidemia   . Hypertension   . Motion sickness    cars  . PONV (postoperative nausea and vomiting)   . Vertigo    before brain surgery    Past Surgical History:  Procedure Laterality Date  . ABDOMINAL HYSTERECTOMY    . APPENDECTOMY    . BRAIN SURGERY  brain tumor removed 2013  . CARDIAC CATHETERIZATION N/A 09/21/2014   Procedure: Left Heart Cath and Coronary Angiography;  Surgeon: Wellington Hampshire, MD;  Location: [place] [facility];  Service: Cardiovascular;  Laterality: N/A;  . CATARACT EXTRACTION W/  INTRAOCULAR LENS  IMPLANT, BILATERAL    . 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 COLONOSCOPY    . CORONARY STENT INTERVENTION N/A 09/21/2017   Procedure: CORONARY STENT INTERVENTION;  Surgeon: Wellington Hampshire, MD;  Location: [place] [facility];  Service: Cardiovascular;  Laterality: N/A;  . ESOPHAGOGASTRODUODENOSCOPY    . ESOPHAGOGASTRODUODENOSCOPY (EGD) WITH PROPOFOL N/A 11/13/2014   Procedure: ESOPHAGOGASTRODUODENOSCOPY (EGD) WITH PROPOFOL;  Surgeon: Manya Silvas, MD;  [facility]: [facility] ENDOSCOPY;  Service: Endoscopy;  Laterality: N/A;  . LEFT  HEART CATH Right 09/21/2017   Procedure: Left Heart Cath;  Surgeon: Wellington Hampshire, MD;  Location: [place] [facility];  Service: Cardiovascular;  Laterality: Right;  . MICROLARYNGOSCOPY N/A 11/22/2014   Procedure: MICROLARYNGOSCOPY DIRECT WITH REMOVAL EPIGLOTTIC CYST;  Surgeon: Beverly Gust, MD;  Location: [place];  Service: ENT;  Laterality: N/A;    Current Meds  Medication Sig  . atorvastatin (LIPITOR) 40 MG tablet Take 1 tablet (40 mg total) by mouth daily at 6 PM.  . calcium carbonate (CALCIUM 600) 600 MG TABS tablet Take 1 tablet by mouth every morning.  . calcium-vitamin D (OSCAL WITH D) 500-200 MG-UNIT tablet Take 2 tablets by mouth daily with breakfast.  . carvedilol (COREG) 3.125 MG tablet Take 1 tablet (3.125 mg total) by mouth 2 (two) times daily with a meal. (Patient taking differently: Take 1.56 mg by mouth 2 (two) times daily with a meal. )  . cetirizine (ZYRTEC) 10 MG chewable tablet Chew 10 mg by mouth daily.  . clopidogrel (PLAVIX) 75 MG tablet Take 1 tablet (75 mg total) by mouth daily with breakfast.  . cyanocobalamin (V-R VITAMIN B-12) 500 MCG tablet Take 1,000 mcg by mouth every morning.   . nitroGLYCERIN (NITROSTAT) 0.4 MG SL tablet Place 1 tablet (0.4 mg total) under the tongue every 5 (five) minutes as needed for chest pain.  . pantoprazole (PROTONIX) 20 MG tablet Take 20 mg by mouth daily.  . potassium citrate (UROCIT-K) 10 MEQ (1080 MG) SR tablet Take 1 tablet (10 mEq total) by mouth 2 (two) times daily.    Allergies:   Regadenoson; Other; Phenazopyridine; Pyridium [phenazopyridine hcl]; Sulfa antibiotics; Sulfacetamide sodium; and Tamsulosin   Social History:  The patient  reports that she has never smoked. She has never used smokeless tobacco. She reports that she does not drink alcohol or use drugs.   Family History:  The patient's family history includes Heart attack (age of onset: 2) in her mother; Heart disease in her mother; Hyperlipidemia  in her mother; Hypertension in her mother.  ROS:   Review of Systems  Constitutional: Positive for malaise/fatigue. Negative for chills, diaphoresis, fever and weight loss.  HENT: Negative for congestion.   Eyes: Negative for discharge and redness.  Respiratory: Negative for cough, hemoptysis, sputum production, shortness of breath and wheezing.   Cardiovascular: Negative for chest pain, palpitations, orthopnea, claudication, leg swelling and PND.  Gastrointestinal: Negative for abdominal pain, blood in stool, heartburn, melena, nausea and vomiting.  Genitourinary: Negative for hematuria.  Musculoskeletal: Negative for falls and myalgias.  Skin: Negative for rash.  Neurological: Positive for dizziness and weakness. Negative for tingling, tremors, sensory change, speech change, focal weakness and loss of consciousness.  Endo/Heme/Allergies: Does not bruise/bleed easily.  Psychiatric/Behavioral: Negative for substance abuse. The patient is not nervous/anxious.   All other systems reviewed and are negative.    PHYSICAL EXAM:  VS:  BP 140/64 (BP Location: Left Arm, Patient Position: Sitting,  Cuff Size: Normal)   Pulse 88   Ht 5\' 6"  (1.676 m)   Wt 133 lb 8 oz (60.6 kg)   BMI 21.55 kg/m  BMI: Body mass index is 21.55 kg/m.  Physical Exam  Constitutional: She is oriented to person, place, and time. She appears well-developed and well-nourished.  HENT:  Head: Normocephalic and atraumatic.  Eyes: Right eye exhibits no discharge. Left eye exhibits no discharge.  Neck: Normal range of motion. No JVD present.  Cardiovascular: Normal rate, regular rhythm, S1 normal, S2 normal and normal heart sounds. Exam reveals no distant heart sounds, no friction rub, no midsystolic click and no opening snap.  No murmur heard. Pulses:      Posterior tibial pulses are 2+ on the right side, and 2+ on the left side.  Right radial cardiac cath site without any bleeding, swelling, erythema, warmth, or  tenderness to palpation.  Radial pulse 2+.  There is a slight knot noted at the cath site that appears to be a healing hematoma.  Patient indicates this is not tender and is improving.  Pulmonary/Chest: Effort normal and breath sounds normal. No respiratory distress. She has no decreased breath sounds. She has no wheezes. She has no rales. She exhibits no tenderness.  Abdominal: Soft. She exhibits no distension. There is no tenderness.  Musculoskeletal: She exhibits no edema.  Neurological: She is alert and oriented to person, place, and time.  Skin: Skin is warm and dry. No cyanosis. Nails show no clubbing.  Psychiatric: She has a normal mood and affect. Her speech is normal and behavior is normal. Judgment and thought content normal.     EKG:  Was ordered and interpreted by me today. Shows NSR, 88 bpm, no acute ST-T changes  Recent Labs: 09/20/2017: ALT 13; B Natriuretic Peptide 44.0 09/22/2017: BUN 13; Creatinine, Ser 0.82; Hemoglobin 12.8; Platelets 241; Potassium 3.8; Sodium 142  No results found for requested labs within last 8760 hours.   Estimated Creatinine Clearance: 47.8 mL/min (by C-G formula based on SCr of 0.82 mg/dL).   Wt Readings from Last 3 Encounters:  10/06/17 133 lb 8 oz (60.6 kg)  09/21/17 132 lb (59.9 kg)  05/26/17 134 lb 12 oz (61.1 kg)     Other studies reviewed: Additional studies/records reviewed today include: summarized above  ASSESSMENT AND PLAN:  1. CAD involving the native coronary arteries without angina: No symptoms concerning for angina at this time.  Unfortunately, the patient never started aspirin though reports full compliance with Plavix.  She is agreeable to starting aspirin 81 mg daily starting today.  She will continue dual antiplatelet therapy with aspirin 81 mg daily and Plavix 75 mg daily without interruption for at least the next 12 months.  Continue Lipitor as detailed below.  Patient has requested that we stop carvedilol as detailed below.   Patient would likely benefit from a cardiac rehab though we will need to see how her generalized fatigue trends prior to starting this.  Aggressive secondary prevention.  Post-cath instructions.  2. Hypertension: Blood pressure is elevated today at 140/64.  Patient indicates since stopping amlodipine and being placed on carvedilol she has had labile hypertension as detailed above.  She is adamant that she would like to go back on amlodipine today and discontinue carvedilol.  Because of this, we are resuming amlodipine at 2.5 mg daily and discontinuing carvedilol.  She will let us know how her blood pressure trends.  3. Hyperlipidemia: Cannot rule out some of her symptomology coming  from statin.  She was noted to have an LDL of 77 in 06/2017 on pravastatin.  I am decreasing her Lipitor to 10 mg daily in this setting.  We will need to check a fasting lipid and liver function in approximately 8 weeks.  4. Medication management: Patient feels like since starting carvedilol her symptoms have been worse than leading up to her PCI.  However, her symptoms are completely different now.  We have stopped carvedilol and started amlodipine at her wishes as above.  We will also check a BMP, CBC, and TSH.  If her laboratory evaluation is unrevealing and symptoms persist on amlodipine and off carvedilol I recommend she follow-up with her PCP.  Disposition: F/u with Dr. Fletcher Anon or an APP in 4 weeks.  Current medicines are reviewed at length with the patient today.  The patient did not have any concerns regarding medicines.  Signed, Christell Faith, PA-C 10/06/2017 3:37 PM     [address], [place] [address] [phone]

## 2017-10-06 ENCOUNTER — Ambulatory Visit (INDEPENDENT_AMBULATORY_CARE_PROVIDER_SITE_OTHER): Payer: Medicare Other | Admitting: Physician Assistant

## 2017-10-06 ENCOUNTER — Encounter

## 2017-10-06 ENCOUNTER — Ambulatory Visit: Payer: Medicare Other | Admitting: Physician Assistant

## 2017-10-06 ENCOUNTER — Encounter: Payer: Self-pay | Admitting: Physician Assistant

## 2017-10-06 VITALS — BP 140/64 | HR 88 | Ht 66.0 in | Wt 133.5 lb

## 2017-10-06 DIAGNOSIS — E785 Hyperlipidemia, unspecified: Secondary | ICD-10-CM

## 2017-10-06 DIAGNOSIS — Z79899 Other long term (current) drug therapy: Secondary | ICD-10-CM | POA: Diagnosis not present

## 2017-10-06 DIAGNOSIS — I2510 Atherosclerotic heart disease of native coronary artery without angina pectoris: Secondary | ICD-10-CM | POA: Diagnosis not present

## 2017-10-06 DIAGNOSIS — I10 Essential (primary) hypertension: Secondary | ICD-10-CM

## 2017-10-06 MED ORDER — AMLODIPINE BESYLATE 2.5 MG PO TABS: 2.5000 mg | ORAL_TABLET | Freq: Every day | ORAL | 3 refills | Status: DC

## 2017-10-06 MED ORDER — ATORVASTATIN CALCIUM 10 MG PO TABS: 10.0000 mg | ORAL_TABLET | Freq: Every day | ORAL | 3 refills | Status: DC

## 2017-10-06 NOTE — Patient Instructions (Signed)
Medication Instructions: START Aspirin 81 mg daily RESTART Amlodipine 2.5 mg daily DECREASE the Atorvastatin (Lipitor) to 10 mg daily STOP the Carvedilol  If you need a refill on your cardiac medications before your next appointment, please call your pharmacy.   Labwork: Your provider would like for you to have the following labs today: CBC, TSH and BMET  Follow-Up: Your physician wants you to follow-up in 4 weeks with Dr. Fletcher Anon or and APP.   Thank you for choosing Heartcare at [facility]!

## 2017-10-07 ENCOUNTER — Ambulatory Visit: Payer: Medicare Other | Admitting: Urology

## 2017-10-07 LAB — BASIC METABOLIC PANEL
BUN / CREAT RATIO: 17 (ref 12–28)
BUN: 14 mg/dL (ref 8–27)
CALCIUM: 9.6 mg/dL (ref 8.7–10.3)
CO2: 25 mmol/L (ref 20–29)
Chloride: 103 mmol/L (ref 96–106)
Creatinine, Ser: 0.82 mg/dL (ref 0.57–1.00)
GFR calc non Af Amer: 66 mL/min/{1.73_m2} (ref >59)
GFR, EST AFRICAN AMERICAN: 76 mL/min/{1.73_m2} (ref >59)
Glucose: 106 mg/dL — ABNORMAL HIGH (ref 65–99)
POTASSIUM: 4 mmol/L (ref 3.5–5.2)
Sodium: 144 mmol/L (ref 134–144)

## 2017-10-07 LAB — CBC
HEMATOCRIT: 38.1 % (ref 34.0–46.6)
Hemoglobin: 13 g/dL (ref 11.1–15.9)
MCH: 31.3 pg (ref 26.6–33.0)
MCHC: 34.1 g/dL (ref 31.5–35.7)
MCV: 92 fL (ref 79–97)
PLATELETS: 291 10*3/uL (ref 150–450)
RBC: 4.15 x10E6/uL (ref 3.77–5.28)
RDW: 13.4 % (ref 12.3–15.4)
WBC: 7 10*3/uL (ref 3.4–10.8)

## 2017-10-07 LAB — TSH: TSH: 1.05 u[IU]/mL (ref 0.450–4.500)

## 2017-11-11 ENCOUNTER — Encounter: Payer: Self-pay | Admitting: Nurse Practitioner

## 2017-11-11 ENCOUNTER — Ambulatory Visit (INDEPENDENT_AMBULATORY_CARE_PROVIDER_SITE_OTHER): Payer: Medicare Other | Admitting: Nurse Practitioner

## 2017-11-11 VITALS — BP 112/60 | HR 73 | Ht 66.0 in | Wt 134.0 lb

## 2017-11-11 DIAGNOSIS — I2510 Atherosclerotic heart disease of native coronary artery without angina pectoris: Secondary | ICD-10-CM | POA: Diagnosis not present

## 2017-11-11 DIAGNOSIS — E785 Hyperlipidemia, unspecified: Secondary | ICD-10-CM

## 2017-11-11 DIAGNOSIS — I10 Essential (primary) hypertension: Secondary | ICD-10-CM

## 2017-11-11 NOTE — Patient Instructions (Signed)
Medication Instructions: Your physician recommends that you continue on your current medications as directed. Please refer to the Current Medication list given to you today.  If you need a refill on your cardiac medications before your next appointment, please call your pharmacy.   Labwork: Your provider would like for you to have the following labs today: Hepatic and Direct LDL  Follow-Up: Your physician wants you to follow-up in 3 months with Dr. Fletcher Anon.   Thank you for choosing Heartcare at [facility]!

## 2017-11-11 NOTE — Progress Notes (Signed)
Office Visit    Patient Name: Karen Liu Date of Encounter: 11/11/2017  Primary Care Provider:  Baxter Hire, MD Primary Cardiologist:  Kathlyn Sacramento, MD  Chief Complaint    84 year old female with a history of CAD, cerebral hemorrhage, hypertension, hyperlipidemia, and GERD, who presents for follow-up after recent percutaneous intervention and fatigue on her last visit.  Past Medical History    Past Medical History:  Diagnosis Date  . Arthritis    neck  . Brain tumor ([place])   . CAD (coronary artery disease)    a. LHC 7/16: 50% mLAD not sig by FFR (0.9), mild LCx/RCA dz,EF & LVEDP nl; b. LHC 7/19: mLAD 40%, mLCx 30%, ostRCA 30%, mRCA 99% (3.0x26 Resolute DES), nl LVSF, mildly elevated LVEDP.  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 Essential hypertension   . GERD (gastroesophageal reflux disease)   . History of CVA (cerebrovascular accident) 01/02/2011   Overview:  Left  . History of kidney stones   . Hyperlipidemia   . Hypertension   . Motion sickness    cars  . PONV (postoperative nausea and vomiting)   . Vertigo    before brain surgery   Past Surgical History:  Procedure Laterality Date  . ABDOMINAL HYSTERECTOMY    . APPENDECTOMY    . BRAIN SURGERY  brain tumor removed 2013  . CARDIAC CATHETERIZATION N/A 09/21/2014   Procedure: Left Heart Cath and Coronary Angiography;  Surgeon: Wellington Hampshire, MD;  Location: [place] [facility];  Service: Cardiovascular;  Laterality: N/A;  . CATARACT EXTRACTION W/ INTRAOCULAR LENS  IMPLANT, BILATERAL    . 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 COLONOSCOPY    . CORONARY STENT INTERVENTION N/A 09/21/2017   Procedure: CORONARY STENT INTERVENTION;  Surgeon: Wellington Hampshire, MD;  Location: [place] [facility];  Service: Cardiovascular;  Laterality: N/A;  . ESOPHAGOGASTRODUODENOSCOPY    . ESOPHAGOGASTRODUODENOSCOPY (EGD) WITH PROPOFOL N/A 11/13/2014   Procedure:  ESOPHAGOGASTRODUODENOSCOPY (EGD) WITH PROPOFOL;  Surgeon: Manya Silvas, MD;  Location[facility] ENDOSCOPY;  Service: Endoscopy;  Laterality: N/A;  . LEFT HEART CATH Right 09/21/2017   Procedure: Left Heart Cath;  Surgeon: Wellington Hampshire, MD;  Location: [place] [facility];  Service: Cardiovascular;  Laterality: Right;  . MICROLARYNGOSCOPY N/A 11/22/2014   Procedure: MICROLARYNGOSCOPY DIRECT WITH REMOVAL EPIGLOTTIC CYST;  Surgeon: Beverly Gust, MD;  Location: [place];  Service: ENT;  Laterality: N/A;    Allergies  Allergies  Allergen Reactions  . Regadenoson     Severe chest pain, dyspnea and distress (prolonged) Severe chest pain, dyspnea and distress (prolonged)  . Other Rash    Other reaction(s): HIVES   . Phenazopyridine Rash  . Pyridium [Phenazopyridine Hcl] Rash  . Sulfa Antibiotics Rash    Other reaction(s): HIVES  . Sulfacetamide Sodium Rash    Other reaction(s): HIVES  . Tamsulosin Nausea And Vomiting    History of Present Illness    84 year old female with the above past medical history including CAD, cerebral hemorrhage in November 2012, hypertension, hyperlipidemia, and GERD.  Previous catheterization in July 2016 showed moderate nonobstructive LAD disease with a normal FFR.  In July of this year, she presented to [facility] with unstable angina.  Repeat catheterization showed 91% stenosis in the mid RCA and otherwise nonobstructive disease.  The RCA was successfully treated with a drug-eluting stent and she was subsequently discharged.  On follow-up in early August, she  reported fatigue and intermittent dizziness.  She attributed this to carvedilol therapy.  At the time, she was also not yet taking aspirin.  Carvedilol was discontinued and she was placed back on amlodipine therapy, which she had been on before.  Her Lipitor dose was also reduced from 40 mg to 10 mg due to concern that it may have been contributing to fatigue.  Since coming off of  carvedilol, she has noted resolution of fatigue.  She remains active and is shopping on a regular basis, walking much without symptoms or limitations.  She denies chest pain, dyspnea, palpitations, PND, orthopnea, dizziness, syncope, edema, or early satiety.  She is tolerating medications well and notes that her blood pressure has been stable at home.  Home Medications    Prior to Admission medications   Medication Sig Start Date End Date Taking? Authorizing Provider  amLODipine (NORVASC) 2.5 MG tablet Take 1 tablet (2.5 mg total) by mouth daily. 10/06/17 01/04/18 Yes Dunn, Areta Haber, PA-C  aspirin EC 81 MG tablet Take 1 tablet by mouth every morning.   Yes [provider]  atorvastatin (LIPITOR) 10 MG tablet Take 1 tablet (10 mg total) by mouth daily at 6 PM. 10/06/17  Yes Dunn, Areta Haber, PA-C  calcium-vitamin D (OSCAL WITH D) 500-200 MG-UNIT tablet Take 2 tablets by mouth daily with breakfast.   Yes [provider]  cetirizine (ZYRTEC) 10 MG chewable tablet Chew 10 mg by mouth daily.   Yes [provider]  clopidogrel (PLAVIX) 75 MG tablet Take 1 tablet (75 mg total) by mouth daily with breakfast. 09/23/17  Yes Theora Gianotti, NP  cyanocobalamin (V-R VITAMIN B-12) 500 MCG tablet Take 1,000 mcg by mouth every morning.  02/18/11  Yes [provider]  nitroGLYCERIN (NITROSTAT) 0.4 MG SL tablet Place 1 tablet (0.4 mg total) under the tongue every 5 (five) minutes as needed for chest pain. 09/22/17  Yes Theora Gianotti, NP  pantoprazole (PROTONIX) 20 MG tablet Take 20 mg by mouth daily. 09/25/17 09/25/18 Yes [provider]  potassium citrate (UROCIT-K) 10 MEQ (1080 MG) SR tablet Take 1 tablet (10 mEq total) by mouth 2 (two) times daily. 02/19/17  Yes Stoioff, Ronda Fairly, MD    Review of Systems    She denies chest pain, palpitations, dyspnea, pnd, orthopnea, n, v, dizziness, syncope, edema, weight gain, or early satiety.  Doing well since her last  visit without any additional fatigue.  All other systems reviewed and are otherwise negative except as noted above.  Physical Exam    VS:  BP 112/60 (BP Location: Left Arm, Patient Position: Sitting, Cuff Size: Normal)   Pulse 73   Ht 5\' 6"  (1.676 m)   Wt 134 lb (60.8 kg)   BMI 21.63 kg/m  , BMI Body mass index is 21.63 kg/m. GEN: Well nourished, well developed, in no acute distress. HEENT: normal. Neck: Supple, no JVD, carotid bruits, or masses. Cardiac: RRR, no murmurs, rubs, or gallops. No clubbing, cyanosis, edema.  Radials/DP/PT 2+ and equal bilaterally.  Respiratory:  Respirations regular and unlabored, clear to auscultation bilaterally. GI: Soft, nontender, nondistended, BS + x 4. MS: no deformity or atrophy. Skin: warm and dry, no rash. Neuro:  Strength and sensation are intact. Psych: Normal affect.  Accessory Clinical Findings    ECG personally reviewed by me today -regular sinus rhythm, 73, no acute ST or T changes- no acute changes.  Assessment & Plan    1.  Coronary artery disease: Status  post PCI drug-eluting stent placement to the RCA in July.  She has moderate, nonobstructive residual LAD, circumflex, and ostial RCA disease.  She has not been having any chest pain or dyspnea and remains active.  She remains on aspirin, Plavix, and statin therapy.  2.  Essential hypertension: Blood pressure stable today at 112/60.  She remains on amlodipine therapy.  3.  Hyperlipidemia: LDL was 77 in April on colestipol.  This has since been switched to Lipitor.  She is currently only on 10 mg.  Follow-up a direct LDL and LFTs today.  4.  Disposition: Follow-up labs today.  Follow-up in clinic in 3 months or sooner if necessary.  Murray Hodgkins, NP 11/11/2017, 12:05 PM

## 2017-11-12 LAB — HEPATIC FUNCTION PANEL
ALT: 14 IU/L (ref 0–32)
AST: 17 IU/L (ref 0–40)
Albumin: 4.2 g/dL (ref 3.5–4.7)
Alkaline Phosphatase: 78 IU/L (ref 39–117)
BILIRUBIN TOTAL: 0.3 mg/dL (ref 0.0–1.2)
BILIRUBIN, DIRECT: 0.11 mg/dL (ref 0.00–0.40)
Total Protein: 6.8 g/dL (ref 6.0–8.5)

## 2017-11-12 LAB — LDL CHOLESTEROL, DIRECT: LDL DIRECT: 80 mg/dL (ref 0–99)

## 2017-11-16 ENCOUNTER — Telehealth: Payer: Self-pay | Admitting: *Deleted

## 2017-11-16 DIAGNOSIS — I2510 Atherosclerotic heart disease of native coronary artery without angina pectoris: Secondary | ICD-10-CM

## 2017-11-16 DIAGNOSIS — E785 Hyperlipidemia, unspecified: Secondary | ICD-10-CM

## 2017-11-16 NOTE — Telephone Encounter (Signed)
Left voicemail message to call back  

## 2017-11-16 NOTE — Telephone Encounter (Signed)
-----   Message from Theora Gianotti, NP sent at 11/12/2017  7:48 AM EDT ----- LDL is 80.  Goal <70.  Please have her increase lipitor to 40mg .  F/u lipids/lft's in 6 wks.

## 2017-11-16 NOTE — Telephone Encounter (Signed)
Patient returning call see note below

## 2017-11-17 MED ORDER — ATORVASTATIN CALCIUM 40 MG PO TABS: 40.0000 mg | ORAL_TABLET | Freq: Every day | ORAL | 3 refills | Status: DC

## 2017-11-17 NOTE — Telephone Encounter (Signed)
Will schedule when Oct schedule is released.

## 2017-11-17 NOTE — Telephone Encounter (Signed)
Spoke with patient and reviewed results and recommendations. She verbalized understanding with no further questions at this time. Medication increase sent to her pharmacy of choice and will have scheduling call and set up appointment for repeat labs. Instructed her to fast for those labs and she verbalized understanding.

## 2017-11-20 NOTE — Telephone Encounter (Signed)
Lmov for patient to call and schedule labs in 6 weeks

## 2017-11-25 NOTE — Telephone Encounter (Signed)
Scheduled labs for 10/23

## 2017-12-23 ENCOUNTER — Other Ambulatory Visit (INDEPENDENT_AMBULATORY_CARE_PROVIDER_SITE_OTHER): Payer: Medicare Other

## 2017-12-23 DIAGNOSIS — E785 Hyperlipidemia, unspecified: Secondary | ICD-10-CM

## 2017-12-23 DIAGNOSIS — I2510 Atherosclerotic heart disease of native coronary artery without angina pectoris: Secondary | ICD-10-CM

## 2017-12-24 LAB — LIPID PANEL
CHOL/HDL RATIO: 2.3 ratio (ref 0.0–4.4)
Cholesterol, Total: 129 mg/dL (ref 100–199)
HDL: 56 mg/dL (ref >39)
LDL Calculated: 63 mg/dL (ref 0–99)
Triglycerides: 52 mg/dL (ref 0–149)
VLDL Cholesterol Cal: 10 mg/dL (ref 5–40)

## 2017-12-24 LAB — HEPATIC FUNCTION PANEL
ALBUMIN: 4.3 g/dL (ref 3.5–4.7)
ALK PHOS: 77 IU/L (ref 39–117)
ALT: 14 IU/L (ref 0–32)
AST: 18 IU/L (ref 0–40)
BILIRUBIN TOTAL: 0.4 mg/dL (ref 0.0–1.2)
BILIRUBIN, DIRECT: 0.12 mg/dL (ref 0.00–0.40)
TOTAL PROTEIN: 6.8 g/dL (ref 6.0–8.5)

## 2017-12-28 ENCOUNTER — Other Ambulatory Visit: Payer: Self-pay

## 2017-12-28 ENCOUNTER — Emergency Department
Admission: EM | Admit: 2017-12-28 | Discharge: 2017-12-28 | Disposition: A | Discharge status: Home / Self Care | Payer: Medicare Other | Attending: Emergency Medicine | Admitting: Emergency Medicine

## 2017-12-28 ENCOUNTER — Emergency Department: Payer: Medicare Other

## 2017-12-28 DIAGNOSIS — R079 Chest pain, unspecified: Secondary | ICD-10-CM | POA: Diagnosis present

## 2017-12-28 DIAGNOSIS — I10 Essential (primary) hypertension: Secondary | ICD-10-CM | POA: Insufficient documentation

## 2017-12-28 DIAGNOSIS — I2510 Atherosclerotic heart disease of native coronary artery without angina pectoris: Secondary | ICD-10-CM | POA: Insufficient documentation

## 2017-12-28 DIAGNOSIS — Z7982 Long term (current) use of aspirin: Secondary | ICD-10-CM | POA: Insufficient documentation

## 2017-12-28 DIAGNOSIS — Z79899 Other long term (current) drug therapy: Secondary | ICD-10-CM | POA: Insufficient documentation

## 2017-12-28 DIAGNOSIS — Z7902 Long term (current) use of antithrombotics/antiplatelets: Secondary | ICD-10-CM | POA: Diagnosis not present

## 2017-12-28 LAB — BASIC METABOLIC PANEL
ANION GAP: 9 (ref 5–15)
BUN: 18 mg/dL (ref 8–23)
CHLORIDE: 106 mmol/L (ref 98–111)
CO2: 25 mmol/L (ref 22–32)
Calcium: 9.3 mg/dL (ref 8.9–10.3)
Creatinine, Ser: 0.88 mg/dL (ref 0.44–1.00)
GFR calc non Af Amer: 58 mL/min — ABNORMAL LOW (ref >60)
GLUCOSE: 106 mg/dL — AB (ref 70–99)
Potassium: 3.5 mmol/L (ref 3.5–5.1)
Sodium: 140 mmol/L (ref 135–145)

## 2017-12-28 LAB — CBC
HEMATOCRIT: 38 % (ref 36.0–46.0)
Hemoglobin: 12.5 g/dL (ref 12.0–15.0)
MCH: 31 pg (ref 26.0–34.0)
MCHC: 32.9 g/dL (ref 30.0–36.0)
MCV: 94.3 fL (ref 80.0–100.0)
NRBC: 0 % (ref 0.0–0.2)
Platelets: 282 10*3/uL (ref 150–400)
RBC: 4.03 MIL/uL (ref 3.87–5.11)
RDW: 12.3 % (ref 11.5–15.5)
WBC: 9.1 10*3/uL (ref 4.0–10.5)

## 2017-12-28 LAB — TROPONIN I: Troponin I: <0.03 ng/mL (ref <0.03)

## 2017-12-28 NOTE — ED Triage Notes (Signed)
Pt c/o chest pain that began 30 min PTA.

## 2017-12-28 NOTE — ED Provider Notes (Signed)
[facility] Emergency Department Provider Note   ____________________________________________   I have reviewed the triage vital signs and the nursing notes.   HISTORY  Chief Complaint Chest Pain   History limited by: Not Limited   HPI Karen Liu is a 84 y.o. female who presents to the emergency department today with concerns for chest pain.  The pain started when the patient was at [facility].  Located in her center chest.  She does describe it as pressure-like.  She did have some associated shortness of breath.  She denies any diaphoresis.  She states that the pain lasts about 15 to 20 minutes.  At the time my exam it is gone.  It somewhat reminded her of pain she had earlier this year when she was admitted and had a stent placed.  She states since that time she has been doing very well.  Denies any recent illness.  Per medical record review patient has a history of CAD  Past Medical History:  Diagnosis Date  . Arthritis    neck  . Brain tumor ([place])   . CAD (coronary artery disease)    a. LHC 7/16: 50% mLAD not sig by FFR (0.9), mild LCx/RCA dz,EF & LVEDP nl; b. LHC 7/19: mLAD 40%, mLCx 30%, ostRCA 30%, mRCA 99% (3.0x26 Resolute DES), nl LVSF, mildly elevated LVEDP.  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 Essential hypertension   . GERD (gastroesophageal reflux disease)   . History of CVA (cerebrovascular accident) 01/02/2011   Overview:  Left  . History of kidney stones   . Hyperlipidemia   . Hypertension   . Motion sickness    cars  . PONV (postoperative nausea and vomiting)   . Vertigo    before brain surgery    Patient Active Problem List   Diagnosis Date Noted  . Chest pain with moderate risk of acute coronary syndrome 09/21/2017  . Unstable angina ([place])   . Coronary artery disease involving native coronary artery of native heart without angina pectoris 04/29/2016  . History of nephrolithiasis 12/20/2014  . Chest tightness  09/14/2014  . Essential hypertension   . Hyperlipidemia   . GERD (gastroesophageal reflux disease) 06/23/2014  . 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 History of CVA (cerebrovascular accident) 01/02/2011    Past Surgical History:  Procedure Laterality Date  . ABDOMINAL HYSTERECTOMY    . APPENDECTOMY    . BRAIN SURGERY  brain tumor removed 2013  . CARDIAC CATHETERIZATION N/A 09/21/2014   Procedure: Left Heart Cath and Coronary Angiography;  Surgeon: Wellington Hampshire, MD;  Location: [place] CV LAB;  Service: Cardiovascular;  Laterality: N/A;  . CATARACT EXTRACTION W/ INTRAOCULAR LENS  IMPLANT, BILATERAL    . 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 COLONOSCOPY    . CORONARY STENT INTERVENTION N/A 09/21/2017   Procedure: CORONARY STENT INTERVENTION;  Surgeon: Wellington Hampshire, MD;  Location: [place] [facility];  Service: Cardiovascular;  Laterality: N/A;  . ESOPHAGOGASTRODUODENOSCOPY    . ESOPHAGOGASTRODUODENOSCOPY (EGD) WITH PROPOFOL N/A 11/13/2014   Procedure: ESOPHAGOGASTRODUODENOSCOPY (EGD) WITH PROPOFOL;  Surgeon: Manya Silvas, MD;  Location: [facility] ENDOSCOPY;  Service: Endoscopy;  Laterality: N/A;  . LEFT HEART CATH Right 09/21/2017   Procedure: Left Heart Cath;  Surgeon: Wellington Hampshire, MD;  Location: [place] [facility] LAB;  Service: Cardiovascular;  Laterality: Right;  . MICROLARYNGOSCOPY N/A 11/22/2014   Procedure: MICROLARYNGOSCOPY DIRECT WITH REMOVAL EPIGLOTTIC CYST;  Surgeon:  Beverly Gust, MD;  Location: [place];  Service: ENT;  Laterality: N/A;    Prior to Admission medications   Medication Sig Start Date End Date Taking? Authorizing Provider  amLODipine (NORVASC) 2.5 MG tablet Take 1 tablet (2.5 mg total) by mouth daily. 10/06/17 01/04/18  Rise Mu, PA-C  aspirin EC 81 MG tablet Take 1 tablet by mouth every morning.    [provider]  atorvastatin (LIPITOR) 40 MG tablet Take 1 tablet (40 mg total) by mouth  daily at 6 PM. 11/17/17 02/15/18  Theora Gianotti, NP  calcium-vitamin D (OSCAL WITH D) 500-200 MG-UNIT tablet Take 2 tablets by mouth daily with breakfast.    [provider]  cetirizine (ZYRTEC) 10 MG chewable tablet Chew 10 mg by mouth daily.    [provider]  clopidogrel (PLAVIX) 75 MG tablet Take 1 tablet (75 mg total) by mouth daily with breakfast. 09/23/17   Theora Gianotti, NP  cyanocobalamin (V-R VITAMIN B-12) 500 MCG tablet Take 1,000 mcg by mouth every morning.  02/18/11   [provider]  nitroGLYCERIN (NITROSTAT) 0.4 MG SL tablet Place 1 tablet (0.4 mg total) under the tongue every 5 (five) minutes as needed for chest pain. 09/22/17   Theora Gianotti, NP  pantoprazole (PROTONIX) 20 MG tablet Take 20 mg by mouth daily. 09/25/17 09/25/18  [provider]  potassium citrate (UROCIT-K) 10 MEQ (1080 MG) SR tablet Take 1 tablet (10 mEq total) by mouth 2 (two) times daily. 02/19/17   Abbie Sons, MD    Allergies Regadenoson; Other; Phenazopyridine; Pyridium [phenazopyridine hcl]; Sulfa antibiotics; Sulfacetamide sodium; and Tamsulosin  Family History  Problem Relation Age of Onset  . Heart attack Mother 38  . Heart disease Mother   . Hypertension Mother   . Hyperlipidemia Mother   . Breast cancer Neg Hx     Social History Social History   Tobacco Use  . Smoking status: Never Smoker  . Smokeless tobacco: Never Used  Substance Use Topics  . Alcohol use: No  . Drug use: No    Review of Systems Constitutional: No fever/chills Eyes: No visual changes. ENT: No sore throat. Cardiovascular: Positive for chest pain. Respiratory: Positive for shortness of breath. Gastrointestinal: No abdominal pain.  No nausea, no vomiting.  No diarrhea.   Genitourinary: Negative for dysuria. Musculoskeletal: Negative for back pain. Skin: Negative for rash. Neurological: Negative for headaches, focal weakness or  numbness.  ____________________________________________   PHYSICAL EXAM:  VITAL SIGNS: ED Triage Vitals [12/28/17 1338]  Enc Vitals Group     BP (!) 132/57     Pulse Rate 86     Resp 18     Temp 98.2 F (36.8 C)     Temp src      SpO2 100 %     Weight 134 lb 0.6 oz (60.8 kg)     Height      Head Circumference      Peak Flow      Pain Score 10    Constitutional: Alert and oriented.  Eyes: Conjunctivae are normal.  ENT      Head: Normocephalic and atraumatic.      Nose: No congestion/rhinnorhea.      Mouth/Throat: Mucous membranes are moist.      Neck: No stridor. Hematological/Lymphatic/Immunilogical: No cervical lymphadenopathy. Cardiovascular: Normal rate, regular rhythm.  No murmurs, rubs, or gallops.  Respiratory: Normal respiratory effort without tachypnea nor retractions. Breath sounds are clear and equal bilaterally.  No wheezes/rales/rhonchi. Gastrointestinal: Soft and non tender. No rebound. No guarding.  Genitourinary: Deferred Musculoskeletal: Normal range of motion in all extremities. No lower extremity edema. Neurologic:  Normal speech and language. No gross focal neurologic deficits are appreciated.  Skin:  Skin is warm, dry and intact. No rash noted. Psychiatric: Mood and affect are normal. Speech and behavior are normal. Patient exhibits appropriate insight and judgment.  ____________________________________________    LABS (pertinent positives/negatives)  Trop <0.03 x 2 CBC wnl BMP wnl except glu 106  ____________________________________________   EKG  I, Nance Pear, attending physician, personally viewed and interpreted this EKG  EKG Time: 1334 Rate: 85 Rhythm: normal sinus rhythm Axis: normal Intervals: qtc 454 QRS: narrow, q waves v1 ST changes: no st elevation Impression: abnormal ekg   ____________________________________________    RADIOLOGY  CXR No acute  disease  ____________________________________________   PROCEDURES  Procedures  ____________________________________________   INITIAL IMPRESSION / ASSESSMENT AND PLAN / ED COURSE  Pertinent labs & imaging results that were available during my care of the patient were reviewed by me and considered in my medical decision making (see chart for details).   Patient presented to the emergency department today because of 15 to 20-minute episode of chest pain.  Differential would include unstable angina, ACS, PE, dissection, pneumothorax, pneumonia amongst other etiologies.  Patient had troponin negative x2.  Given clinical history and evaluation I doubt PE or dissection.  Patient does have history of coronary artery disease.  I discussed with Dr. Fletcher Anon.  This point will plan on discharging to follow-up shortly with Dr. Fletcher Anon in clinic.  Did discuss strict return precautions with patient.   ____________________________________________   FINAL CLINICAL IMPRESSION(S) / ED DIAGNOSES  Final diagnoses:  Nonspecific chest pain     Note: This dictation was prepared with Dragon dictation. Any transcriptional errors that result from this process are unintentional     Nance Pear, MD 12/28/17 [phone]

## 2017-12-28 NOTE — Discharge Instructions (Addendum)
Please seek medical attention for any high fevers, chest pain, shortness of breath, change in behavior, persistent vomiting, bloody stool or any other new or concerning symptoms.  

## 2017-12-31 ENCOUNTER — Telehealth: Payer: Self-pay | Admitting: *Deleted

## 2017-12-31 NOTE — Telephone Encounter (Signed)
Patient was recently at PCP and had a moment of chest pain that lasted 15-20 minutes. Call was placed to the patient to check on her. She stated that she has not had chest pain since then and has been taking her protonix which has helped alleviate that symptom. She has been advised to call back if anything further is needed.

## 2018-02-19 ENCOUNTER — Encounter: Payer: Self-pay | Admitting: Cardiovascular Disease

## 2018-02-19 ENCOUNTER — Ambulatory Visit: Payer: Medicare Other | Admitting: Urology

## 2018-02-19 ENCOUNTER — Ambulatory Visit (INDEPENDENT_AMBULATORY_CARE_PROVIDER_SITE_OTHER): Payer: Medicare Other | Admitting: Cardiovascular Disease

## 2018-02-19 VITALS — BP 140/64 | HR 73 | Ht 66.0 in | Wt 131.0 lb

## 2018-02-19 DIAGNOSIS — E785 Hyperlipidemia, unspecified: Secondary | ICD-10-CM | POA: Diagnosis not present

## 2018-02-19 DIAGNOSIS — I2510 Atherosclerotic heart disease of native coronary artery without angina pectoris: Secondary | ICD-10-CM

## 2018-02-19 DIAGNOSIS — I10 Essential (primary) hypertension: Secondary | ICD-10-CM

## 2018-02-19 MED ORDER — PANTOPRAZOLE SODIUM 20 MG PO TBEC: 20.0000 mg | DELAYED_RELEASE_TABLET | Freq: Every day | ORAL | 3 refills | Status: DC

## 2018-02-19 MED ORDER — ATORVASTATIN CALCIUM 40 MG PO TABS: 40.0000 mg | ORAL_TABLET | Freq: Every day | ORAL | 3 refills | Status: DC

## 2018-02-19 MED ORDER — CLOPIDOGREL BISULFATE 75 MG PO TABS: 75.0000 mg | ORAL_TABLET | Freq: Every day | ORAL | 1 refills | Status: DC

## 2018-02-19 NOTE — Patient Instructions (Signed)

## 2018-02-19 NOTE — Progress Notes (Signed)
Cardiology Office Note   Date:  02/19/2018   ID:  Karen Liu, DOB 09/10/1930, MRN [id number]  PCP:  Baxter Hire, MD  Cardiologist:   Kathlyn Sacramento, MD   Chief Complaint  Patient presents with  . other    3 mo follow up. Denies CP and SOB.Medications reveiwed verbally.      History of Present Illness: Karen Liu is a 84 y.o. female who presents for a follow-up visit regarding coronary artery disease.   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 She is not a smoker. Her mother had myocardial infarction in her early 28s. Cardiac catheterization in July 2016 showed 50% mid LAD stenosis which was not significant by FFR (0.9) and mild left circumflex/RCA disease. EF and LVEDP were both normal.  She presented in July of this year with unstable angina.  Repeat cardiac catheterization showed 99% stenosis in the mid RCA and stable moderate LAD disease.  The RCA was treated successfully with PCI and drug-eluting stent placement. She had dizziness that was thought to be due to carvedilol which was switched back to amlodipine.  She went to the emergency room in October with chest pain that was felt to be due to GERD.  Troponin was negative.  She was placed on Protonix with resolution of symptoms.   Past Medical History:  Diagnosis Date  . Arthritis    neck  . Brain tumor ([place])   . CAD (coronary artery disease)    a. LHC 7/16: 50% mLAD not sig by FFR (0.9), mild LCx/RCA dz,EF & LVEDP nl; b. LHC 7/19: mLAD 40%, mLCx 30%, ostRCA 30%, mRCA 99% (3.0x26 Resolute DES), nl LVSF, mildly elevated LVEDP.  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 Essential hypertension   . GERD (gastroesophageal reflux disease)   . History of CVA (cerebrovascular accident) 01/02/2011   Overview:  Left  . History of kidney stones   . Hyperlipidemia   . Hypertension   . Motion sickness    cars  . PONV (postoperative nausea and  vomiting)   . Vertigo    before brain surgery    Past Surgical History:  Procedure Laterality Date  . ABDOMINAL HYSTERECTOMY    . APPENDECTOMY    . BRAIN SURGERY  brain tumor removed 2013  . CARDIAC CATHETERIZATION N/A 09/21/2014   Procedure: Left Heart Cath and Coronary Angiography;  Surgeon: Wellington Hampshire, MD;  Location: [place] [facility];  Service: Cardiovascular;  Laterality: N/A;  . CATARACT EXTRACTION W/ INTRAOCULAR LENS  IMPLANT, BILATERAL    . 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 COLONOSCOPY    . CORONARY STENT INTERVENTION N/A 09/21/2017   Procedure: CORONARY STENT INTERVENTION;  Surgeon: Wellington Hampshire, MD;  Location: [place] [facility];  Service: Cardiovascular;  Laterality: N/A;  . ESOPHAGOGASTRODUODENOSCOPY    . ESOPHAGOGASTRODUODENOSCOPY (EGD) WITH PROPOFOL N/A 11/13/2014   Procedure: ESOPHAGOGASTRODUODENOSCOPY (EGD) WITH PROPOFOL;  Surgeon: Manya Silvas, MD;  Location: [facility] ENDOSCOPY;  Service: Endoscopy;  Laterality: N/A;  . LEFT HEART CATH Right 09/21/2017   Procedure: Left Heart Cath;  Surgeon: Wellington Hampshire, MD;  Location: [place] CV LAB;  Service: Cardiovascular;  Laterality: Right;  . MICROLARYNGOSCOPY N/A 11/22/2014   Procedure: MICROLARYNGOSCOPY DIRECT WITH REMOVAL EPIGLOTTIC CYST;  Surgeon: Beverly Gust, MD;  Location: [place];  Service: ENT;  Laterality: N/A;     Current Outpatient Medications  Medication Sig Dispense Refill  . aspirin EC 81 MG tablet Take 1 tablet by mouth every morning.    Marland Kitchen atorvastatin (LIPITOR) 40 MG tablet Take 1 tablet (40 mg total) by mouth daily at 6 PM. 90 tablet 3  . calcium-vitamin D (OSCAL WITH D) 500-200 MG-UNIT tablet Take 2 tablets by mouth daily with breakfast.    . cetirizine (ZYRTEC) 10 MG chewable tablet Chew 10 mg by mouth daily.    . clopidogrel (PLAVIX) 75 MG tablet Take 1 tablet (75 mg total) by mouth daily with breakfast. 30 tablet 6  .  cyanocobalamin (V-R VITAMIN B-12) 500 MCG tablet Take 1,000 mcg by mouth every morning.     . nitroGLYCERIN (NITROSTAT) 0.4 MG SL tablet Place 1 tablet (0.4 mg total) under the tongue every 5 (five) minutes as needed for chest pain. 25 tablet 3  . pantoprazole (PROTONIX) 20 MG tablet Take 20 mg by mouth daily.    . potassium citrate (UROCIT-K) 10 MEQ (1080 MG) SR tablet Take 1 tablet (10 mEq total) by mouth 2 (two) times daily. 180 tablet 3  . amLODipine (NORVASC) 2.5 MG tablet Take 1 tablet (2.5 mg total) by mouth daily. 90 tablet 3   No current facility-administered medications for this visit.     Allergies:   Regadenoson; Other; Phenazopyridine; Pyridium [phenazopyridine hcl]; Sulfa antibiotics; Sulfacetamide sodium; and Tamsulosin    Social History:  The patient  reports that she has never smoked. She has never used smokeless tobacco. She reports that she does not drink alcohol or use drugs.   Family History:  The patient's family history includes Heart attack (age of onset: 65) in her mother; Heart disease in her mother; Hyperlipidemia in her mother; Hypertension in her mother.    ROS:  Please see the history of present illness.   Otherwise, review of systems are positive for none.   All other systems are reviewed and negative.    PHYSICAL EXAM: VS:  BP 140/64 (BP Location: Left Arm, Patient Position: Sitting, Cuff Size: Normal)   Pulse 73   Ht 5\' 6"  (1.676 m)   Wt 131 lb (59.4 kg)   BMI 21.14 kg/m  , BMI Body mass index is 21.14 kg/m. GEN: Well nourished, well developed, in no acute distress  HEENT: normal  Neck: no JVD, carotid bruits, or masses Cardiac: RRR; no murmurs, rubs, or gallops,no edema  Respiratory:  clear to auscultation bilaterally, normal work of breathing GI: soft, nontender, nondistended, + BS MS: no deformity or atrophy  Skin: warm and dry, no rash Neuro:  Strength and sensation are intact Psych: euthymic mood, full affect   EKG:  EKG is ordered  today. The ekg ordered today demonstrates normal sinus rhythm with no significant ST or T wave changes.   Recent Labs: 09/20/2017: B Natriuretic Peptide 44.0 10/06/2017: TSH 1.050 12/23/2017: ALT 14 12/28/2017: BUN 18; Creatinine, Ser 0.88; Hemoglobin 12.5; Platelets 282; Potassium 3.5; Sodium 140    Lipid Panel    Component Value Date/Time   CHOL 129 12/23/2017 0850   TRIG 52 12/23/2017 0850   HDL 56 12/23/2017 0850   CHOLHDL 2.3 12/23/2017 0850   LDLCALC 63 12/23/2017 0850   LDLDIRECT 80 11/11/2017 1156      Wt Readings from Last 3 Encounters:  02/19/18 131 lb (59.4 kg)  12/28/17 134 lb 0.6 oz (60.8 kg)  11/11/17 134 lb (60.8 kg)        ASSESSMENT AND PLAN:  1.  Coronary artery disease involving  native coronary artery without angina: She is doing well now.  Recent chest pain was likely due to GERD and resolved with Protonix.  Continue dual antiplatelet therapy  2. Hyperlipidemia: Continue treatment with atorvastatin.  Most recent lipid profile showed an LDL of 63.  3.  Essential hypertension: Blood pressure is reasonably controlled on amlodipine.   Disposition:   FU with me in 6 months  Signed,  Kathlyn Sacramento, MD  02/19/2018 1:53 PM    [place]

## 2018-04-02 ENCOUNTER — Other Ambulatory Visit: Payer: Self-pay | Admitting: Internal Medicine

## 2018-04-02 DIAGNOSIS — Z1231 Encounter for screening mammogram for malignant neoplasm of breast: Secondary | ICD-10-CM

## 2018-04-06 ENCOUNTER — Ambulatory Visit
Admission: RE | Admit: 2018-04-06 | Discharge: 2018-04-06 | Disposition: A | Discharge status: Home / Self Care | Payer: Medicare Other | Source: Ambulatory Visit | Attending: Urology | Admitting: Urology

## 2018-04-06 ENCOUNTER — Ambulatory Visit
Admission: RE | Admit: 2018-04-06 | Discharge: 2018-04-06 | Disposition: A | Discharge status: Home / Self Care | Payer: Medicare Other | Attending: Urology | Admitting: Urology

## 2018-04-06 ENCOUNTER — Ambulatory Visit: Payer: Medicare Other | Admitting: Urology

## 2018-04-06 DIAGNOSIS — N200 Calculus of kidney: Secondary | ICD-10-CM | POA: Diagnosis present

## 2018-04-10 NOTE — Progress Notes (Signed)
04/12/2018  1:19 PM   Karen Liu Jul 02, 1930 [id number]  Referring provider: Baxter Hire, MD [place], [place] [address]  Chief Complaint  Patient presents with  . Nephrolithiasis    1year w/KUB    HPI: Karen Liu is a 85 y.o. female with a history of uric acid stones on Urocit-K who presents today for care and management of renal calculi.  A KUB in 01/2017 showed small, bilateral nonobstructing renal calculi which was confirmed by the ultrasound.  The KUB performed on 04/06/2018 shows stable bilateral nephrolithiasis.  She has occasional twinges of pain in the lower left quadrant which are short-lived, which is why she thinks she has passed a stone.  PMH: Past Medical History:  Diagnosis Date  . Arthritis    neck  . Brain tumor ([place])   . CAD (coronary artery disease)    a. LHC 7/16: 50% mLAD not sig by FFR (0.9), mild LCx/RCA dz,EF & LVEDP nl; b. LHC 7/19: mLAD 40%, mLCx 30%, ostRCA 30%, mRCA 99% (3.0x26 Resolute DES), nl LVSF, mildly elevated LVEDP.  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 Essential hypertension   . GERD (gastroesophageal reflux disease)   . History of CVA (cerebrovascular accident) 01/02/2011   Overview:  Left  . History of kidney stones   . Hyperlipidemia   . Hypertension   . Motion sickness    cars  . PONV (postoperative nausea and vomiting)   . Vertigo    before brain surgery    Surgical History: Past Surgical History:  Procedure Laterality Date  . ABDOMINAL HYSTERECTOMY    . APPENDECTOMY    . BRAIN SURGERY  brain tumor removed 2013  . CARDIAC CATHETERIZATION N/A 09/21/2014   Procedure: Left Heart Cath and Coronary Angiography;  Surgeon: Wellington Hampshire, MD;  Location: [facility];  Service: Cardiovascular;  Laterality: N/A;  . CATARACT EXTRACTION W/ INTRAOCULAR LENS  IMPLANT, BILATERAL    . 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 COLONOSCOPY    . CORONARY  STENT INTERVENTION N/A 09/21/2017   Procedure: CORONARY STENT INTERVENTION;  Surgeon: Wellington Hampshire, MD;  Location: [facility];  Service: Cardiovascular;  Laterality: N/A;  . ESOPHAGOGASTRODUODENOSCOPY    . ESOPHAGOGASTRODUODENOSCOPY (EGD) WITH PROPOFOL N/A 11/13/2014   Procedure: ESOPHAGOGASTRODUODENOSCOPY (EGD) WITH PROPOFOL;  Surgeon: Manya Silvas, MD;  Location: [facility];  Service: Endoscopy;  Laterality: N/A;  . LEFT HEART CATH Right 09/21/2017   Procedure: Left Heart Cath;  Surgeon: Wellington Hampshire, MD;  Location: [facility];  Service: Cardiovascular;  Laterality: Right;  . MICROLARYNGOSCOPY N/A 11/22/2014   Procedure: MICROLARYNGOSCOPY DIRECT WITH REMOVAL EPIGLOTTIC CYST;  Surgeon: Beverly Gust, MD;  Location: [facility];  Service: ENT;  Laterality: N/A;    Home Medications:  Allergies as of 04/12/2018      Reactions   Regadenoson    Severe chest pain, dyspnea and distress (prolonged) Severe chest pain, dyspnea and distress (prolonged)   Other Rash   Other reaction(s): HIVES   Phenazopyridine Rash   Pyridium [phenazopyridine Hcl] Rash   Sulfa Antibiotics Rash   Other reaction(s): HIVES   Sulfacetamide Sodium Rash   Other reaction(s): HIVES   Tamsulosin Nausea And Vomiting      Medication List       Accurate as of April 12, 2018  1:19 PM. Always use your most recent med list.  amLODipine 2.5 MG tablet Commonly known as:  NORVASC Take 1 tablet (2.5 mg total) by mouth daily.   amLODipine 2.5 MG tablet Commonly known as:  NORVASC   aspirin EC 81 MG tablet Take 1 tablet by mouth every morning.   atorvastatin 40 MG tablet Commonly known as:  LIPITOR Take 1 tablet (40 mg total) by mouth daily at 6 PM.   calcium-vitamin D 500-200 MG-UNIT tablet Commonly known as:  OSCAL WITH D Take 2 tablets by mouth daily with breakfast.   cetirizine 10 MG chewable tablet Commonly known as:  ZYRTEC Chew 10 mg by mouth daily.     clopidogrel 75 MG tablet Commonly known as:  PLAVIX Take 1 tablet (75 mg total) by mouth daily with breakfast.   nitroGLYCERIN 0.4 MG SL tablet Commonly known as:  NITROSTAT Place 1 tablet (0.4 mg total) under the tongue every 5 (five) minutes as needed for chest pain.   pantoprazole 20 MG tablet Commonly known as:  PROTONIX Take 1 tablet (20 mg total) by mouth daily.   potassium citrate 10 MEQ (1080 MG) SR tablet Commonly known as:  UROCIT-K Take 1 tablet (10 mEq total) by mouth 2 (two) times daily.   V-R VITAMIN B-12 500 MCG tablet Generic drug:  vitamin B-12 Take 1,000 mcg by mouth every morning.       Allergies:  Allergies  Allergen Reactions  . Regadenoson     Severe chest pain, dyspnea and distress (prolonged) Severe chest pain, dyspnea and distress (prolonged)  . Other Rash    Other reaction(s): HIVES   . Phenazopyridine Rash  . Pyridium [Phenazopyridine Hcl] Rash  . Sulfa Antibiotics Rash    Other reaction(s): HIVES  . Sulfacetamide Sodium Rash    Other reaction(s): HIVES  . Tamsulosin Nausea And Vomiting    Family History: Family History  Problem Relation Age of Onset  . Heart attack Mother 80  . Heart disease Mother   . Hypertension Mother   . Hyperlipidemia Mother   . Breast cancer Neg Hx     Social History:  reports that she has never smoked. She has never used smokeless tobacco. She reports that she does not drink alcohol or use drugs.  ROS: UROLOGY Frequent Urination?: Yes Hard to postpone urination?: No Burning/pain with urination?: No Get up at night to urinate?: Yes Leakage of urine?: Yes Urine stream starts and stops?: No Trouble starting stream?: No Do you have to strain to urinate?: No Blood in urine?: No Urinary tract infection?: No Sexually transmitted disease?: No Injury to kidneys or bladder?: No Painful intercourse?: No Weak stream?: No Currently pregnant?: No Vaginal bleeding?: No Last menstrual period?:  n  Gastrointestinal Nausea?: No Vomiting?: No Indigestion/heartburn?: No Diarrhea?: No Constipation?: No  Constitutional Fever: No Night sweats?: No Weight loss?: No Fatigue?: No  Skin Skin rash/lesions?: No Itching?: No  Eyes Blurred vision?: No Double vision?: No  Ears/Nose/Throat Sore throat?: No Sinus problems?: No  Hematologic/Lymphatic Swollen glands?: No Easy bruising?: No  Cardiovascular Leg swelling?: No Chest pain?: No  Respiratory Cough?: No Shortness of breath?: No  Endocrine Excessive thirst?: No  Musculoskeletal Back pain?: No Joint pain?: No  Neurological Headaches?: No Dizziness?: No  Psychologic Depression?: No Anxiety?: No  Physical Exam: BP 121/64   Pulse 92   Ht 5\' 6"  (1.676 m)   Wt 133 lb (60.3 kg)   BMI 21.47 kg/m   Constitutional: Well nourished. Alert and oriented, No acute distress. Cardiovascular: No clubbing, cyanosis, or edema.  Respiratory: Normal respiratory effort, no increased work of breathing. Skin: No rashes, bruises or suspicious lesions. Neurologic: Grossly intact, no focal deficits, moving all 4 extremities. Psychiatric: Normal mood and affect.   Laboratory Data: Lab Results  Component Value Date   WBC 9.1 12/28/2017   HGB 12.5 12/28/2017   HCT 38.0 12/28/2017   MCV 94.3 12/28/2017   PLT 282 12/28/2017    Lab Results  Component Value Date   CREATININE 0.88 12/28/2017   Pertinent Imaging: Images personally reviewed Results for orders placed during the hospital encounter of 04/06/18  Abdomen 1 view (KUB)   Narrative CLINICAL DATA:  Pt states history of bilateral kidney stones, no symptoms at this. History of hysterectomy, appendectomy.  EXAM: ABDOMEN - 1 VIEW  COMPARISON:  03/11/2017 and 03/30/2017  FINDINGS: There is significant stool burden throughout nondilated loops of colon. Stool extends from the cecum to the rectosigmoid. Radiopaque calculi overlie the regions of the kidneys  bilaterally. Largest visualized on the RIGHT is 3 millimeters. Largest stone on the LEFT is 4 millimeters. No evidence for free intraperitoneal air.  IMPRESSION: 1. Bilateral nephrolithiasis. 2. Significant stool burden.   Electronically Signed   By: Nolon Nations M.D.   On: 04/06/2018 14:01    Assessment & Plan:    1. Nephrolithiasis - Stable nephrolithiasis.  Continue annual follow-up. - Patient instructed to contact clinic for development of renal colic or bothersome flank/abdominal pain  Return in about 1 year (around 04/13/2019) for Follow-up with KUB prior.  Abbie Sons, [facility] [address], [place], [place] [address] [phone]  I, Adele Schilder, am acting as a scribe for John Giovanni, MD.    I, Abbie Sons, MD, have reviewed all documentation for this visit. The documentation on 04/12/18 for the exam, diagnosis, procedures, and orders are all accurate and complete.

## 2018-04-12 ENCOUNTER — Encounter: Payer: Self-pay | Admitting: Urology

## 2018-04-12 ENCOUNTER — Ambulatory Visit (INDEPENDENT_AMBULATORY_CARE_PROVIDER_SITE_OTHER): Payer: Medicare Other | Admitting: Urology

## 2018-04-12 VITALS — BP 121/64 | HR 92 | Ht 66.0 in | Wt 133.0 lb

## 2018-04-12 DIAGNOSIS — N200 Calculus of kidney: Secondary | ICD-10-CM

## 2018-05-05 ENCOUNTER — Ambulatory Visit
Admission: RE | Admit: 2018-05-05 | Discharge: 2018-05-05 | Disposition: A | Discharge status: Home / Self Care | Payer: Medicare Other | Source: Ambulatory Visit | Attending: Internal Medicine | Admitting: Internal Medicine

## 2018-05-05 DIAGNOSIS — Z1231 Encounter for screening mammogram for malignant neoplasm of breast: Secondary | ICD-10-CM | POA: Diagnosis present

## 2018-05-19 ENCOUNTER — Other Ambulatory Visit: Payer: Self-pay | Admitting: *Deleted

## 2018-05-19 MED ORDER — POTASSIUM CITRATE ER 10 MEQ (1080 MG) PO TBCR: 10.0000 meq | EXTENDED_RELEASE_TABLET | Freq: Two times a day (BID) | ORAL | 3 refills | Status: DC

## 2018-08-16 ENCOUNTER — Other Ambulatory Visit: Payer: Self-pay

## 2018-08-16 ENCOUNTER — Encounter: Payer: Self-pay | Admitting: Emergency Medicine

## 2018-08-16 ENCOUNTER — Emergency Department
Admission: EM | Admit: 2018-08-16 | Discharge: 2018-08-16 | Disposition: A | Discharge status: Home / Self Care | Payer: Medicare Other | Attending: Student in an Organized Health Care Education/Training Program | Admitting: Student in an Organized Health Care Education/Training Program

## 2018-08-16 ENCOUNTER — Emergency Department: Payer: Medicare Other

## 2018-08-16 DIAGNOSIS — I2510 Atherosclerotic heart disease of native coronary artery without angina pectoris: Secondary | ICD-10-CM | POA: Insufficient documentation

## 2018-08-16 DIAGNOSIS — N201 Calculus of ureter: Secondary | ICD-10-CM

## 2018-08-16 DIAGNOSIS — R109 Unspecified abdominal pain: Secondary | ICD-10-CM | POA: Diagnosis present

## 2018-08-16 DIAGNOSIS — Z7902 Long term (current) use of antithrombotics/antiplatelets: Secondary | ICD-10-CM | POA: Insufficient documentation

## 2018-08-16 DIAGNOSIS — Z7982 Long term (current) use of aspirin: Secondary | ICD-10-CM | POA: Insufficient documentation

## 2018-08-16 DIAGNOSIS — I10 Essential (primary) hypertension: Secondary | ICD-10-CM | POA: Diagnosis not present

## 2018-08-16 DIAGNOSIS — Z79899 Other long term (current) drug therapy: Secondary | ICD-10-CM | POA: Insufficient documentation

## 2018-08-16 LAB — URINALYSIS, COMPLETE (UACMP) WITH MICROSCOPIC
Bacteria, UA: NONE SEEN
Bilirubin Urine: NEGATIVE
Glucose, UA: NEGATIVE mg/dL
Ketones, ur: NEGATIVE mg/dL
Leukocytes,Ua: NEGATIVE
Nitrite: NEGATIVE
Protein, ur: NEGATIVE mg/dL
Specific Gravity, Urine: 1.013 (ref 1.005–1.030)
pH: 7 (ref 5.0–8.0)

## 2018-08-16 LAB — COMPREHENSIVE METABOLIC PANEL
ALT: 14 U/L (ref 0–44)
AST: 19 U/L (ref 15–41)
Albumin: 4.1 g/dL (ref 3.5–5.0)
Alkaline Phosphatase: 78 U/L (ref 38–126)
Anion gap: 12 (ref 5–15)
BUN: 20 mg/dL (ref 8–23)
CO2: 24 mmol/L (ref 22–32)
Calcium: 9.2 mg/dL (ref 8.9–10.3)
Chloride: 104 mmol/L (ref 98–111)
Creatinine, Ser: 0.86 mg/dL (ref 0.44–1.00)
GFR calc Af Amer: >60 mL/min (ref >60)
GFR calc non Af Amer: >60 mL/min (ref >60)
Glucose, Bld: 147 mg/dL — ABNORMAL HIGH (ref 70–99)
Potassium: 3.4 mmol/L — ABNORMAL LOW (ref 3.5–5.1)
Sodium: 140 mmol/L (ref 135–145)
Total Bilirubin: 0.7 mg/dL (ref 0.3–1.2)
Total Protein: 7.2 g/dL (ref 6.5–8.1)

## 2018-08-16 LAB — CBC
HCT: 36 % (ref 36.0–46.0)
Hemoglobin: 11.8 g/dL — ABNORMAL LOW (ref 12.0–15.0)
MCH: 30.2 pg (ref 26.0–34.0)
MCHC: 32.8 g/dL (ref 30.0–36.0)
MCV: 92.1 fL (ref 80.0–100.0)
Platelets: 268 10*3/uL (ref 150–400)
RBC: 3.91 MIL/uL (ref 3.87–5.11)
RDW: 12.6 % (ref 11.5–15.5)
WBC: 14.6 10*3/uL — ABNORMAL HIGH (ref 4.0–10.5)
nRBC: 0 % (ref 0.0–0.2)

## 2018-08-16 LAB — TROPONIN I: Troponin I: <0.03 ng/mL (ref <0.03)

## 2018-08-16 LAB — LIPASE, BLOOD: Lipase: 27 U/L (ref 11–51)

## 2018-08-16 MED ORDER — HYDROCODONE-ACETAMINOPHEN 5-325 MG PO TABS: 1.0000 | ORAL_TABLET | ORAL | 0 refills | Status: DC | PRN

## 2018-08-16 MED ORDER — SODIUM CHLORIDE 0.9% FLUSH: 3.0000 mL | Freq: Once | INTRAVENOUS | Status: DC

## 2018-08-16 MED ORDER — IOHEXOL 300 MG/ML  SOLN
100.0000 mL | Freq: Once | INTRAMUSCULAR | Status: AC | PRN
  Administered 2018-08-16: 100 mL via INTRAVENOUS

## 2018-08-16 MED ORDER — KETOROLAC TROMETHAMINE 30 MG/ML IJ SOLN
15.0000 mg | Freq: Once | INTRAMUSCULAR | Status: AC
  Administered 2018-08-16: 15 mg via INTRAVENOUS
  Filled 2018-08-16: qty 1

## 2018-08-16 MED ORDER — ONDANSETRON HCL 4 MG/2ML IJ SOLN
4.0000 mg | Freq: Once | INTRAMUSCULAR | Status: AC
  Administered 2018-08-16: 10:00:00 4 mg via INTRAVENOUS
  Filled 2018-08-16: qty 2

## 2018-08-16 MED ORDER — FENTANYL CITRATE (PF) 100 MCG/2ML IJ SOLN
25.0000 ug | INTRAMUSCULAR | Status: DC | PRN
  Administered 2018-08-16: 25 ug via INTRAVENOUS
  Filled 2018-08-16: qty 2

## 2018-08-16 MED ORDER — ONDANSETRON HCL 4 MG PO TABS: 4.0000 mg | ORAL_TABLET | Freq: Every day | ORAL | 0 refills | Status: DC | PRN

## 2018-08-16 MED ORDER — SODIUM CHLORIDE 0.9 % IV BOLUS
500.0000 mL | Freq: Once | INTRAVENOUS | Status: AC
  Administered 2018-08-16: 10:00:00 500 mL via INTRAVENOUS

## 2018-08-16 NOTE — ED Notes (Signed)

## 2018-08-16 NOTE — ED Notes (Signed)
Pt given ice water and graham crackers for PO challenge per EDP instruction.

## 2018-08-16 NOTE — Discharge Instructions (Addendum)
Return to the ER immediately if you develop any fevers, inability to drink fluids or for worsening pain.  Patient has been sent to your pharmacy.  Please follow-up with Dr. Bernardo Heater.

## 2018-08-16 NOTE — ED Provider Notes (Signed)
[facility] Emergency Department Provider Note    First MD Initiated Contact with Patient 08/16/18 [phone]     (approximate)  I have reviewed the triage vital signs and the nursing notes.   HISTORY  Chief Complaint Abdominal Pain    HPI Karen Liu is a 85 y.o. female presents the ER for evaluation of left-sided abdominal pain that started around 4 AM this morning.  Is never had pain like this.  Does have a history of diverticulitis as well as kidney stones but this feels different.  Denies any measured fevers or temperature but does feel nauseated has been dry heaving.  States the pain is moderate in severity.  Is not taking anything for pain.  Denies any dysuria or diarrhea.    Past Medical History:  Diagnosis Date  . Arthritis    neck  . Brain tumor ([place])   . CAD (coronary artery disease)    a. LHC 7/16: 50% mLAD not sig by FFR (0.9), mild LCx/RCA dz,EF & LVEDP nl; b. LHC 7/19: mLAD 40%, mLCx 30%, ostRCA 30%, mRCA 99% (3.0x26 Resolute DES), nl LVSF, mildly elevated LVEDP.  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 Essential hypertension   . GERD (gastroesophageal reflux disease)   . History of CVA (cerebrovascular accident) 01/02/2011   Overview:  Left  . History of kidney stones   . Hyperlipidemia   . Hypertension   . Motion sickness    cars  . PONV (postoperative nausea and vomiting)   . Vertigo    before brain surgery   Family History  Problem Relation Age of Onset  . Heart attack Mother 23  . Heart disease Mother   . Hypertension Mother   . Hyperlipidemia Mother   . Breast cancer Neg Hx    Past Surgical History:  Procedure Laterality Date  . ABDOMINAL HYSTERECTOMY    . APPENDECTOMY    . BRAIN SURGERY  brain tumor removed 2013  . CARDIAC CATHETERIZATION N/A 09/21/2014   Procedure: Left Heart Cath and Coronary Angiography;  Surgeon: Wellington Hampshire, MD;  Location: [facility];  Service: Cardiovascular;  Laterality:  N/A;  . CATARACT EXTRACTION W/ INTRAOCULAR LENS  IMPLANT, BILATERAL    . 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 COLONOSCOPY    . CORONARY STENT INTERVENTION N/A 09/21/2017   Procedure: CORONARY STENT INTERVENTION;  Surgeon: Wellington Hampshire, MD;  Location: [facility];  Service: Cardiovascular;  Laterality: N/A;  . ESOPHAGOGASTRODUODENOSCOPY    . ESOPHAGOGASTRODUODENOSCOPY (EGD) WITH PROPOFOL N/A 11/13/2014   Procedure: ESOPHAGOGASTRODUODENOSCOPY (EGD) WITH PROPOFOL;  Surgeon: Manya Silvas, MD;  Location: [facility] ENDOSCOPY;  Service: Endoscopy;  Laterality: N/A;  . LEFT HEART CATH Right 09/21/2017   Procedure: Left Heart Cath;  Surgeon: Wellington Hampshire, MD;  Location: [facility];  Service: Cardiovascular;  Laterality: Right;  . MICROLARYNGOSCOPY N/A 11/22/2014   Procedure: MICROLARYNGOSCOPY DIRECT WITH REMOVAL EPIGLOTTIC CYST;  Surgeon: Beverly Gust, MD;  Location: [facility];  Service: ENT;  Laterality: N/A;   Patient Active Problem List   Diagnosis Date Noted  . Chest pain with moderate risk of acute coronary syndrome 09/21/2017  . Unstable angina ([place])   . Coronary artery disease involving native coronary artery of native heart without angina pectoris 04/29/2016  . History of nephrolithiasis 12/20/2014  . Chest tightness 09/14/2014  . Essential hypertension   . Hyperlipidemia   . GERD (gastroesophageal reflux  disease) 06/23/2014  . 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 History of CVA (cerebrovascular accident) 01/02/2011      Prior to Admission medications   Medication Sig Start Date End Date Taking? Authorizing Provider  amLODipine (NORVASC) 2.5 MG tablet Take 1 tablet (2.5 mg total) by mouth daily. 10/06/17 01/04/18  Rise Mu, PA-C  amLODipine (NORVASC) 2.5 MG tablet  02/25/18   [provider]  aspirin EC 81 MG tablet Take 1 tablet by mouth every morning.    [provider]  atorvastatin  (LIPITOR) 40 MG tablet Take 1 tablet (40 mg total) by mouth daily at 6 PM. 02/19/18 05/20/18  Wellington Hampshire, MD  calcium-vitamin D (OSCAL WITH D) 500-200 MG-UNIT tablet Take 2 tablets by mouth daily with breakfast.    [provider]  cetirizine (ZYRTEC) 10 MG chewable tablet Chew 10 mg by mouth daily.    [provider]  clopidogrel (PLAVIX) 75 MG tablet Take 1 tablet (75 mg total) by mouth daily with breakfast. 02/19/18   Wellington Hampshire, MD  cyanocobalamin (V-R VITAMIN B-12) 500 MCG tablet Take 1,000 mcg by mouth every morning.  02/18/11   [provider]  HYDROcodone-acetaminophen (NORCO) 5-325 MG tablet Take 1 tablet by mouth every 4 (four) hours as needed for moderate pain. 08/16/18   Merlyn Lot, MD  nitroGLYCERIN (NITROSTAT) 0.4 MG SL tablet Place 1 tablet (0.4 mg total) under the tongue every 5 (five) minutes as needed for chest pain. 09/22/17   Theora Gianotti, NP  ondansetron (ZOFRAN) 4 MG tablet Take 1 tablet (4 mg total) by mouth daily as needed. 08/16/18 08/16/19  Merlyn Lot, MD  pantoprazole (PROTONIX) 20 MG tablet Take 1 tablet (20 mg total) by mouth daily. 02/19/18 02/19/19  Wellington Hampshire, MD  potassium citrate (UROCIT-K) 10 MEQ (1080 MG) SR tablet Take 1 tablet (10 mEq total) by mouth 2 (two) times daily. 05/19/18   Stoioff, Ronda Fairly, MD    Allergies Regadenoson, Other, Phenazopyridine, Pyridium [phenazopyridine hcl], Sulfa antibiotics, Sulfacetamide sodium, and Tamsulosin    Social History Social History   Tobacco Use  . Smoking status: Never Smoker  . Smokeless tobacco: Never Used  Substance Use Topics  . Alcohol use: No  . Drug use: No    Review of Systems Patient denies headaches, rhinorrhea, blurry vision, numbness, shortness of breath, chest pain, edema, cough, abdominal pain, nausea, vomiting, diarrhea, dysuria, fevers, rashes or hallucinations unless otherwise stated above in HPI.  ____________________________________________   PHYSICAL EXAM:  VITAL SIGNS: Vitals:   08/16/18 1200 08/16/18 1230  BP: (!) 147/66 123/60  Pulse: (!) 102 (!) 105  Resp: (!) 23 20  Temp:    SpO2: 97% 99%    Constitutional: Alert and oriented.  Eyes: Conjunctivae are normal.  Head: Atraumatic. Nose: No congestion/rhinnorhea. Mouth/Throat: Mucous membranes are moist.   Neck: No stridor. Painless ROM.  Cardiovascular: Normal rate, regular rhythm. Grossly normal heart sounds.  Good peripheral circulation. Respiratory: Normal respiratory effort.  No retractions. Lungs CTAB. Gastrointestinal: Soft with mild ttp in llq. No distention. No abdominal bruits. No CVA tenderness. Genitourinary:  Musculoskeletal: No lower extremity tenderness nor edema.  No joint effusions. Neurologic:  Normal speech and language. No gross focal neurologic deficits are appreciated. No facial droop Skin:  Skin is warm, dry and intact. No rash noted. Psychiatric: Mood and affect are normal. Speech and behavior are normal.  ____________________________________________   LABS (all labs ordered are listed, but only abnormal results are displayed)  Results for orders placed or performed during the hospital encounter of 08/16/18 (from the past 24 hour(s))  Lipase, blood     Status: None   Collection Time: 08/16/18 10:07 AM  Result Value Ref Range   Lipase 27 11 - 51 U/L  Comprehensive metabolic panel     Status: Abnormal   Collection Time: 08/16/18 10:07 AM  Result Value Ref Range   Sodium 140 135 - 145 mmol/L   Potassium 3.4 (L) 3.5 - 5.1 mmol/L   Chloride 104 98 - 111 mmol/L   CO2 24 22 - 32 mmol/L   Glucose, Bld 147 (H) 70 - 99 mg/dL   BUN 20 8 - 23 mg/dL   Creatinine, Ser 0.86 0.44 - 1.00 mg/dL   Calcium 9.2 8.9 - 10.3 mg/dL   Total Protein 7.2 6.5 - 8.1 g/dL   Albumin 4.1 3.5 - 5.0 g/dL   AST 19 15 - 41 U/L   ALT 14 0 - 44 U/L   Alkaline Phosphatase 78 38 - 126 U/L   Total Bilirubin 0.7 0.3 -  1.2 mg/dL   GFR calc non Af Amer >60 >60 mL/min   GFR calc Af Amer >60 >60 mL/min   Anion gap 12 5 - 15  CBC     Status: Abnormal   Collection Time: 08/16/18 10:07 AM  Result Value Ref Range   WBC 14.6 (H) 4.0 - 10.5 K/uL   RBC 3.91 3.87 - 5.11 MIL/uL   Hemoglobin 11.8 (L) 12.0 - 15.0 g/dL   HCT 36.0 36.0 - 46.0 %   MCV 92.1 80.0 - 100.0 fL   MCH 30.2 26.0 - 34.0 pg   MCHC 32.8 30.0 - 36.0 g/dL   RDW 12.6 11.5 - 15.5 %   Platelets 268 150 - 400 K/uL   nRBC 0.0 0.0 - 0.2 %  Urinalysis, Complete w Microscopic     Status: Abnormal   Collection Time: 08/16/18 10:07 AM  Result Value Ref Range   Color, Urine STRAW (A) YELLOW   APPearance CLEAR (A) CLEAR   Specific Gravity, Urine 1.013 1.005 - 1.030   pH 7.0 5.0 - 8.0   Glucose, UA NEGATIVE NEGATIVE mg/dL   Hgb urine dipstick SMALL (A) NEGATIVE   Bilirubin Urine NEGATIVE NEGATIVE   Ketones, ur NEGATIVE NEGATIVE mg/dL   Protein, ur NEGATIVE NEGATIVE mg/dL   Nitrite NEGATIVE NEGATIVE   Leukocytes,Ua NEGATIVE NEGATIVE   RBC / HPF 11-20 0 - 5 RBC/hpf   WBC, UA 11-20 0 - 5 WBC/hpf   Bacteria, UA NONE SEEN NONE SEEN   Squamous Epithelial / LPF 0-5 0 - 5   Mucus PRESENT   Troponin I - ONCE - STAT     Status: None   Collection Time: 08/16/18 10:07 AM  Result Value Ref Range   Troponin I <0.03 <0.03 ng/mL   ____________________________________________  EKG My review and personal interpretation at Time: 9:56   Indication: abd pain  Rate: 70  Rhythm: sinus Axis: normal Other: normal intervals, no stemi ____________________________________________  RADIOLOGY  I personally reviewed all radiographic images ordered to evaluate for the above acute complaints and reviewed radiology reports and findings.  These findings were personally discussed with the patient.  Please see medical record for radiology report.  ____________________________________________   PROCEDURES  Procedure(s) performed:  Procedures    Critical Care  performed: no ____________________________________________   INITIAL IMPRESSION / ASSESSMENT AND PLAN / ED COURSE  Pertinent labs & imaging results that were available during  my care of the patient were reviewed by me and considered in my medical decision making (see chart for details).   DDX: diverticulitis, stone, colitis, mass, uti, pyelo  Karen Liu is a 85 y.o. who presents to the ED with symptoms as described above.  Patient is afebrile hemodynamically stable.  Does appear in moderate discomfort therefore CT imaging will be ordered for the by differential.  Will provide pain medications with IV fluids.  Clinical Course as of Aug 15 1313  Mon Aug 16, 2018  1226 Patient's pain improved.  No evidence of infected urine or sepsis.  Discussed case with Dr. Gloriann Loan of urology.  Agrees with plan for close outpatient follow-up with urology.  Patient tolerating oral hydration.  Able to ambulate.  We discussed strict return precautions.   [PR]    Clinical Course User Index [PR] Merlyn Lot, MD    The patient was evaluated in Emergency Department today for the symptoms described in the history of present illness. He/she was evaluated in the context of the global COVID-19 pandemic, which necessitated consideration that the patient might be at risk for infection with the SARS-CoV-2 virus that causes COVID-19. Institutional protocols and algorithms that pertain to the evaluation of patients at risk for COVID-19 are in a state of rapid change based on information released by regulatory bodies including the CDC and federal and state organizations. These policies and algorithms were followed during the patient's care in the ED.  As part of my medical decision making, I reviewed the following data within the [facility] notes reviewed and incorporated, Labs reviewed, notes from prior ED visits and [place] Controlled Substance Database     ____________________________________________   FINAL CLINICAL IMPRESSION(S) / ED DIAGNOSES  Final diagnoses:  Ureterolithiasis      NEW MEDICATIONS STARTED DURING THIS VISIT:  New Prescriptions   HYDROCODONE-ACETAMINOPHEN (NORCO) 5-325 MG TABLET    Take 1 tablet by mouth every 4 (four) hours as needed for moderate pain.   ONDANSETRON (ZOFRAN) 4 MG TABLET    Take 1 tablet (4 mg total) by mouth daily as needed.     Note:  This document was prepared using Dragon voice recognition software and may include unintentional dictation errors.    Merlyn Lot, MD 08/16/18 1315

## 2018-08-16 NOTE — ED Triage Notes (Signed)
L upper and lower abdominal pain since 4am. Nausea with vomiting of "stomache acid". Denies fevers. Denies dysuria.

## 2018-08-16 NOTE — ED Notes (Signed)
Patient transported to CT 

## 2018-08-16 NOTE — ED Notes (Signed)
States cannot void for specimen. Cath specimen obtained.

## 2018-08-18 LAB — URINE CULTURE: Culture: >=100000 — AB

## 2018-08-19 ENCOUNTER — Other Ambulatory Visit: Payer: Self-pay | Admitting: Urology

## 2018-08-19 DIAGNOSIS — N200 Calculus of kidney: Secondary | ICD-10-CM

## 2018-08-19 NOTE — ED Provider Notes (Signed)
b ----------------------------------------- 2:28 PM on 08/19/2018 -----------------------------------------  Made aware of positive urine culture, pansensitive E. coli, reviewed chart, I called patient, she has no symptoms she feels "great" no fever no vomiting no abdominal pain no flank pain, has not noticed if she is passed a stone.  Does not feel sick in any way.   Discussed with Dr. Bevelyn Ngo of urology. He and I reviewed the chart together including CT findings urology micro findings etc.  I talked to him about bring the patient back for IV antibiotics versus, given her absence of symptoms, calling in antibiotics for her.  I am always of course concerned about a patient with a kidney stone with a UTI.  However, we currently have 8 patients with coronavirus in the department at this moment and we are in the middle of an epidemic crisis which is only getting worse.  Patient is at very high risk for contracting that disease.  We agreed that given the risk benefits and alternatives it would be in her best interest if we start her on Keflex at home, and give her extensive return precautions which we have done.  Dr. Jeb Levering will follow closely with patient and arrange outpatient follow-up.   Schuyler Amor, MD 08/19/18 1430

## 2018-08-19 NOTE — Progress Notes (Signed)
ED Antimicrobial Stewardship Positive Culture Follow Up   Karen Liu is an 85 y.o. female who presented to [facility] on 08/16/2018 with a chief complaint of  Chief Complaint  Patient presents with  . Abdominal Pain    Recent Results (from the past 720 hour(s))  Urine Culture     Status: Abnormal   Collection Time: 08/16/18 10:07 AM   Specimen: Urine, Random  Result Value Ref Range Status   Specimen Description   Final    URINE, RANDOM Performed at [facility], [address], [place], [place] [address]    Special Requests   Final    NONE Performed at [facility], [place], [place] [address]    Culture >=100,000 COLONIES/mL ESCHERICHIA COLI (A)  Final   Report Status 08/18/2018 FINAL  Final   Organism ID, Bacteria ESCHERICHIA COLI (A)  Final      Susceptibility   Escherichia coli - MIC*    AMPICILLIN 8 SENSITIVE Sensitive     CEFAZOLIN <=4 SENSITIVE Sensitive     CEFTRIAXONE <=1 SENSITIVE Sensitive     CIPROFLOXACIN <=0.25 SENSITIVE Sensitive     GENTAMICIN <=1 SENSITIVE Sensitive     IMIPENEM <=0.25 SENSITIVE Sensitive     NITROFURANTOIN <=16 SENSITIVE Sensitive     TRIMETH/SULFA <=20 SENSITIVE Sensitive     AMPICILLIN/SULBACTAM <=2 SENSITIVE Sensitive     PIP/TAZO <=4 SENSITIVE Sensitive     Extended ESBL NEGATIVE Sensitive     * >=100,000 COLONIES/mL ESCHERICHIA COLI    [x]  Patient discharged originally without antimicrobial agent and treatment is now indicated  New antibiotic prescription:  Keflex 500mg  tid x 10 days  Called in to [facility] in [place], [place] @ [phone] with Susa Griffins @ 1428 6/18  ED Provider: Charlotte Crumb  This pharmacist spoke to patient and encouraged her to pick up and start her antibiotic today.    I also delivered Dr Andre Lefort instructions to pt to return to ED with any fever, pain, or vomiting and to expect a call from urology to set up an outpatient appointment.  The patient was amenable to plan and  intends to pick up her antibiotic today.   Lu Duffel, PharmD, BCPS Clinical Pharmacist 08/19/2018 2:36 PM

## 2018-08-20 ENCOUNTER — Telehealth: Payer: Self-pay | Admitting: Urology

## 2018-08-20 NOTE — Telephone Encounter (Signed)
App made  Patient notified

## 2018-08-20 NOTE — Telephone Encounter (Signed)
-----   Message from Billey Co, MD sent at 08/19/2018  2:47 PM EDT ----- Regarding: follow up Please schedule follow up with me next week, KUB prior, ok to Dorene Ar, MD 08/19/2018

## 2018-08-23 ENCOUNTER — Telehealth: Payer: Self-pay

## 2018-08-23 ENCOUNTER — Ambulatory Visit
Admission: RE | Admit: 2018-08-23 | Discharge: 2018-08-23 | Disposition: A | Discharge status: Home / Self Care | Payer: Medicare Other | Source: Ambulatory Visit | Attending: Urology | Admitting: Urology

## 2018-08-23 ENCOUNTER — Other Ambulatory Visit: Payer: Self-pay

## 2018-08-23 DIAGNOSIS — N200 Calculus of kidney: Secondary | ICD-10-CM

## 2018-08-23 NOTE — Telephone Encounter (Signed)
Virtual Visit Pre-Appointment Phone Call  "Karen Liu, I am calling you today to discuss your upcoming appointment. We are currently trying to limit exposure to the virus that causes COVID-19 by seeing patients at home rather than in the office."  1. "What is the BEST phone number to call the day of the visit?" - include this in appointment notes  2. Do you have or have access to (through a family member/friend) a smartphone with video capability that we can use for your visit?" a. If yes - list this number in appt notes as cell (if different from BEST phone #) and list the appointment type as a VIDEO visit in appointment notes b. If no - list the appointment type as a PHONE visit in appointment notes  3. Confirm consent - "In the setting of the current Covid19 crisis, you are scheduled for a phone visit with your provider on 09/28/2018 at 4:00PM.  Just as we do with many in-office visits, in order for you to participate in this visit, we must obtain consent.  If you'd like, I can send this to your mychart (if signed up) or email for you to review.  Otherwise, I can obtain your verbal consent now.  All virtual visits are billed to your insurance company just like a normal visit would be.  By agreeing to a virtual visit, we'd like you to understand that the technology does not allow for your provider to perform an examination, and thus may limit your provider's ability to fully assess your condition. If your provider identifies any concerns that need to be evaluated in person, we will make arrangements to do so.  Finally, though the technology is pretty good, we cannot assure that it will always work on either your or our end, and in the setting of a video visit, we may have to convert it to a phone-only visit.  In either situation, we cannot ensure that we have a secure connection.  Are you willing to proceed?" STAFF: Did the patient verbally acknowledge consent to telehealth visit? Document YES/NO  here: YES  4. Advise patient to be prepared - "Two hours prior to your appointment, go ahead and check your blood pressure, pulse, oxygen saturation, and your weight (if you have the equipment to check those) and write them all down. When your visit starts, your provider will ask you for this information. If you have an Apple Watch or Kardia device, please plan to have heart rate information ready on the day of your appointment. Please have a pen and paper handy nearby the day of the visit as well."  5. Give patient instructions for MyChart download to smartphone OR Doximity/Doxy.me as below if video visit (depending on what platform provider is using)  6. Inform patient they will receive a phone call 15 minutes prior to their appointment time (may be from unknown caller ID) so they should be prepared to answer    Karen Liu has been deemed a candidate for a follow-up tele-health visit to limit community exposure during the Covid-19 pandemic. I spoke with the patient via phone to ensure availability of phone/video source, confirm preferred email & phone number, and discuss instructions and expectations.  I reminded Karen Liu to be prepared with any vital sign and/or heart rhythm information that could potentially be obtained via home monitoring, at the time of her visit. I reminded Karen Liu to expect a phone call prior to her visit.  [name] 08/23/2018 3:22 PM    FULL LENGTH CONSENT FOR TELE-HEALTH VISIT   I hereby voluntarily request, consent and authorize [facility] and its employed or contracted physicians, physician assistants, nurse practitioners or other licensed health care professionals (the Practitioner), to provide me with telemedicine health care services (the Services") as deemed necessary by the treating Practitioner. I acknowledge and consent to receive the Services by the Practitioner via telemedicine. I understand that the  telemedicine visit will involve communicating with the Practitioner through live audiovisual communication technology and the disclosure of certain medical information by electronic transmission. I acknowledge that I have been given the opportunity to request an in-person assessment or other available alternative prior to the telemedicine visit and am voluntarily participating in the telemedicine visit.  I understand that I have the right to withhold or withdraw my consent to the use of telemedicine in the course of my care at any time, without affecting my right to future care or treatment, and that the Practitioner or I may terminate the telemedicine visit at any time. I understand that I have the right to inspect all information obtained and/or recorded in the course of the telemedicine visit and may receive copies of available information for a reasonable fee.  I understand that some of the potential risks of receiving the Services via telemedicine include:   Delay or interruption in medical evaluation due to technological equipment failure or disruption;  Information transmitted may not be sufficient (e.g. poor resolution of images) to allow for appropriate medical decision making by the Practitioner; and/or   In rare instances, security protocols could fail, causing a breach of personal health information.  Furthermore, I acknowledge that it is my responsibility to provide information about my medical history, conditions and care that is complete and accurate to the best of my ability. I acknowledge that Practitioner's advice, recommendations, and/or decision may be based on factors not within their control, such as incomplete or inaccurate data provided by me or distortions of diagnostic images or specimens that may result from electronic transmissions. I understand that the practice of medicine is not an exact science and that Practitioner makes no warranties or guarantees regarding treatment  outcomes. I acknowledge that I will receive a copy of this consent concurrently upon execution via email to the email address I last provided but may also request a printed copy by calling the office of [place].    I understand that my insurance will be billed for this visit.   I have read or had this consent read to me.  I understand the contents of this consent, which adequately explains the benefits and risks of the Services being provided via telemedicine.   I have been provided ample opportunity to ask questions regarding this consent and the Services and have had my questions answered to my satisfaction.  I give my informed consent for the services to be provided through the use of telemedicine in my medical care  By participating in this telemedicine visit I agree to the above.

## 2018-08-24 ENCOUNTER — Telehealth (INDEPENDENT_AMBULATORY_CARE_PROVIDER_SITE_OTHER): Payer: Medicare Other | Admitting: Urology

## 2018-08-24 DIAGNOSIS — N390 Urinary tract infection, site not specified: Secondary | ICD-10-CM | POA: Diagnosis not present

## 2018-08-24 DIAGNOSIS — N200 Calculus of kidney: Secondary | ICD-10-CM

## 2018-08-24 NOTE — Progress Notes (Signed)
Virtual Visit via Telephone Note  I connected with Karen Liu on 08/24/18 at  2:30 PM EDT by telephone and verified that I am speaking with the correct person using two identifiers.   I discussed the limitations, risks, security and privacy concerns of performing an evaluation and management service by telephone and the availability of in person appointments. We discussed the impact of the COVID-19 pandemic on the healthcare system, and the importance of social distancing and reducing patient and provider exposure. I also discussed with the patient that there may be a patient responsible charge related to this service. The patient expressed understanding and agreed to proceed.  Reason for visit: Left distal ureteral stone, ER follow up  History of Present Illness: Ms. Karen Liu is an 85 year old female regularly followed by Dr. Bernardo Heater in urology clinic for recurrent nephrolithiasis.  I was contacted by one of the ER staff and asked to follow-up with her on a virtual visit.  She presented to the ED on 08/16/2018 with left-sided groin and flank pain and was found to have a 5 mm left distal ureteral stone with upstream hydronephrosis.  Urinalysis was benign appearing with no bacteria, 11-20 RBCs, 11-20 WBCs, and nitrite negative, and she was discharged home with medical expulsive therapy.  However, her culture returned positive a few days later for E. Coli.  She was contacted by the ER at that time and was doing well with no complaints of flank pain, dysuria, pelvic pain, or fevers.  She was started on culture appropriate Keflex, and instructed to follow-up if any worsening symptoms.  KUB obtained yesterday showed clearance of her left distal ureteral stone.  She reports she is doing very well and has not had any pain over the last week.  She denies any fevers, chills, or lower urinary tract symptoms.  She is halfway through her course of Keflex.  Assessment and Plan: 85 year old female with recurrent  nephrolithiasis, who spontaneously passed a 5 mm left distal ureteral stone.  Follow Up: Keep scheduled follow-up with Dr. Bernardo Heater in February 2021 for regular stone surveillance   I discussed the assessment and treatment plan with the patient. The patient was provided an opportunity to ask questions and all were answered. The patient agreed with the plan and demonstrated an understanding of the instructions.   The patient was advised to call back or seek an in-person evaluation if the symptoms worsen or if the condition fails to improve as anticipated.  I provided 12 minutes of non-face-to-face time during this encounter.   Billey Co, MD

## 2018-09-28 ENCOUNTER — Other Ambulatory Visit: Payer: Self-pay

## 2018-09-28 ENCOUNTER — Telehealth (INDEPENDENT_AMBULATORY_CARE_PROVIDER_SITE_OTHER): Payer: Medicare Other | Admitting: Cardiovascular Disease

## 2018-09-28 ENCOUNTER — Encounter: Payer: Self-pay | Admitting: Cardiovascular Disease

## 2018-09-28 VITALS — BP 125/62 | HR 75 | Ht 66.0 in | Wt 129.0 lb

## 2018-09-28 DIAGNOSIS — I10 Essential (primary) hypertension: Secondary | ICD-10-CM

## 2018-09-28 DIAGNOSIS — I2510 Atherosclerotic heart disease of native coronary artery without angina pectoris: Secondary | ICD-10-CM

## 2018-09-28 DIAGNOSIS — E785 Hyperlipidemia, unspecified: Secondary | ICD-10-CM

## 2018-09-28 NOTE — Progress Notes (Signed)
Virtual Visit via Telephone Note   This visit type was conducted due to national recommendations for restrictions regarding the COVID-19 Pandemic (e.g. social distancing) in an effort to limit this patient's exposure and mitigate transmission in our community.  Due to her co-morbid illnesses, this patient is at least at moderate risk for complications without adequate follow up.  This format is felt to be most appropriate for this patient at this time.  The patient did not have access to video technology/had technical difficulties with video requiring transitioning to audio format only (telephone).  All issues noted in this document were discussed and addressed.  No physical exam could be performed with this format.  Please refer to the patient's chart for her  consent to telehealth for [facility].   Date:  09/28/2018   ID:  Karen Liu, DOB 1931-02-05, MRN [id number]  Patient Location: Home Provider Location: Office  PCP:  Baxter Hire, MD  Cardiologist:  Kathlyn Sacramento, MD  Electrophysiologist:  None   Evaluation Performed:  Follow-Up Visit  Chief Complaint: Doing well with no complaints.  History of Present Illness:    Karen Liu is a 85 y.o. female was reached via phone for a follow-up visit regarding coronary artery disease. 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 She had unstable angina in July 2019. Cardiac catheterization showed 99% stenosis in the mid RCA and stable moderate LAD disease.  The RCA was treated successfully with PCI and drug-eluting stent placement. She had dizziness that was thought to be due to carvedilol which was switched back to amlodipine.  She has been doing well with no recent chest pain, shortness of breath or palpitations.  She had recent kidney stone that passed spontaneously with no intervention.  The patient does not have symptoms concerning for COVID-19 infection (fever,  chills, cough, or new shortness of breath).    Past Medical History:  Diagnosis Date  . Arthritis    neck  . Brain tumor ([place])   . CAD (coronary artery disease)    a. LHC 7/16: 50% mLAD not sig by FFR (0.9), mild LCx/RCA dz,EF & LVEDP nl; b. LHC 7/19: mLAD 40%, mLCx 30%, ostRCA 30%, mRCA 99% (3.0x26 Resolute DES), nl LVSF, mildly elevated LVEDP.  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 Essential hypertension   . GERD (gastroesophageal reflux disease)   . History of CVA (cerebrovascular accident) 01/02/2011   Overview:  Left  . History of kidney stones   . Hyperlipidemia   . Hypertension   . Motion sickness    cars  . PONV (postoperative nausea and vomiting)   . Vertigo    before brain surgery   Past Surgical History:  Procedure Laterality Date  . ABDOMINAL HYSTERECTOMY    . APPENDECTOMY    . BRAIN SURGERY  brain tumor removed 2013  . CARDIAC CATHETERIZATION N/A 09/21/2014   Procedure: Left Heart Cath and Coronary Angiography;  Surgeon: Wellington Hampshire, MD;  Location: [place] [facility];  Service: Cardiovascular;  Laterality: N/A;  . CATARACT EXTRACTION W/ INTRAOCULAR LENS  IMPLANT, BILATERAL    . 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 COLONOSCOPY    . CORONARY STENT INTERVENTION N/A 09/21/2017   Procedure: CORONARY STENT INTERVENTION;  Surgeon: Wellington Hampshire, MD;  Location: [place] [facility];  Service: Cardiovascular;  Laterality: N/A;  . ESOPHAGOGASTRODUODENOSCOPY    . ESOPHAGOGASTRODUODENOSCOPY (EGD) WITH PROPOFOL  N/A 11/13/2014   Procedure: ESOPHAGOGASTRODUODENOSCOPY (EGD) WITH PROPOFOL;  Surgeon: Manya Silvas, MD;  Location: [facility] END[facility]COPY;  Service: Endoscopy;  Laterality: N/A;  . LEFT HEART CATH Right 09/21/2017   Procedure: Left Heart Cath;  Surgeon: Wellington Hampshire, MD;  Location: [place] [facility];  Service: Cardiovascular;  Laterality: Right;  . MICROLARYNGOSCOPY N/A 11/22/2014   Procedure: MICROLARYNGOSCOPY  DIRECT WITH REMOVAL EPIGLOTTIC CYST;  Surgeon: Beverly Gust, MD;  Location: [place];  Service: ENT;  Laterality: N/A;     Current Meds  Medication Sig  . amLODipine (NORVASC) 2.5 MG tablet Take 1 tablet (2.5 mg total) by mouth daily.  Marland Kitchen aspirin EC 81 MG tablet Take 1 tablet by mouth every morning.  Marland Kitchen atorvastatin (LIPITOR) 40 MG tablet Take 1 tablet (40 mg total) by mouth daily at 6 PM.  . calcium-vitamin D (OSCAL WITH D) 500-200 MG-UNIT tablet Take 2 tablets by mouth daily with breakfast.  . cetirizine (ZYRTEC) 10 MG chewable tablet Chew 10 mg by mouth daily as needed.   . clopidogrel (PLAVIX) 75 MG tablet Take 1 tablet (75 mg total) by mouth daily with breakfast.  . cyanocobalamin (V-R VITAMIN B-12) 500 MCG tablet Take 1,000 mcg by mouth every morning.   Marland Kitchen HYDROcodone-acetaminophen (NORCO) 5-325 MG tablet Take 1 tablet by mouth every 4 (four) hours as needed for moderate pain.  . nitroGLYCERIN (NITROSTAT) 0.4 MG SL tablet Place 1 tablet (0.4 mg total) under the tongue every 5 (five) minutes as needed for chest pain.  Marland Kitchen ondansetron (ZOFRAN) 4 MG tablet Take 1 tablet (4 mg total) by mouth daily as needed.  . pantoprazole (PROTONIX) 40 MG tablet Take 40 mg by mouth daily.  . potassium citrate (UROCIT-K) 10 MEQ (1080 MG) SR tablet Take 1 tablet (10 mEq total) by mouth 2 (two) times daily.     Allergies:   Regadenoson, Other, Phenazopyridine, Pyridium [phenazopyridine hcl], Sulfa antibiotics, Sulfacetamide sodium, and Tamsulosin   Social History   Tobacco Use  . Smoking status: Never Smoker  . Smokeless tobacco: Never Used  Substance Use Topics  . Alcohol use: No  . Drug use: No     Family Hx: The patient's family history includes Heart attack (age of onset: 45) in her mother; Heart disease in her mother; Hyperlipidemia in her mother; Hypertension in her mother. There is no history of Breast cancer.  ROS:   Please see the history of present illness.     All other  systems reviewed and are negative.   Prior CV studies:   The following studies were reviewed today:    Labs/Other Tests and Data Reviewed:    EKG:  No ECG reviewed.  Recent Labs: 10/06/2017: TSH 1.050 08/16/2018: ALT 14; BUN 20; Creatinine, Ser 0.86; Hemoglobin 11.8; Platelets 268; Potassium 3.4; Sodium 140   Recent Lipid Panel Lab Results  Component Value Date/Time   CHOL 129 12/23/2017 08:50 AM   TRIG 52 12/23/2017 08:50 AM   HDL 56 12/23/2017 08:50 AM   CHOLHDL 2.3 12/23/2017 08:50 AM   LDLCALC 63 12/23/2017 08:50 AM   LDLDIRECT 80 11/11/2017 11:56 AM    Wt Readings from Last 3 Encounters:  09/28/18 129 lb (58.5 kg)  08/16/18 129 lb 13.6 oz (58.9 kg)  04/12/18 133 lb (60.3 kg)     Objective:    Vital Signs:  BP 125/62 (BP Location: Left Arm, Patient Position: Sitting, Cuff Size: Normal)   Pulse 75   Ht 5\' 6"  (1.676 m)  Wt 129 lb (58.5 kg)   BMI 20.82 kg/m    VITAL SIGNS:  reviewed  ASSESSMENT & PLAN:    1.  Coronary artery disease involving native coronary artery without angina: She is doing well now.  It has been 1 year since drug-eluting stent placement.  Thus, I elected to discontinue clopidogrel and continue aspirin 81 mg daily indefinitely  2. Hyperlipidemia: Continue treatment with atorvastatin.  Most recent lipid profile showed an LDL of 63.  3.  Essential hypertension: Blood pressure is well controlled on amlodipine.  COVID-19 Education: The signs and symptoms of COVID-19 were discussed with the patient and how to seek care for testing (follow up with PCP or arrange E-visit).  The importance of social distancing was discussed today.  Time:   Today, I have spent 8 minutes with the patient with telehealth technology discussing the above problems.     Medication Adjustments/Labs and Tests Ordered: Current medicines are reviewed at length with the patient today.  Concerns regarding medicines are outlined above.   Tests Ordered: No orders of the  defined types were placed in this encounter.   Medication Changes: No orders of the defined types were placed in this encounter.   Follow Up:  In Person in 6 month(s)  Signed, Kathlyn Sacramento, MD  09/28/2018 4:02 PM    [facility]

## 2018-09-28 NOTE — Patient Instructions (Signed)
Medication Instructions:  Stop taking Plavix (clopidogrel). Continue other medications. If you need a refill on your cardiac medications before your next appointment, please call your pharmacy.   Lab work: None If you have labs (blood work) drawn today and your tests are completely normal, you will receive your results only by: Marland Kitchen MyChart Message (if you have MyChart) OR . A paper copy in the mail If you have any lab test that is abnormal or we need to change your treatment, we will call you to review the results.  Testing/Procedures: None  Follow-Up: At [facility], you and your health needs are our priority.  As part of our continuing mission to provide you with exceptional heart care, we have created designated Provider Care Teams.  These Care Teams include your primary Cardiologist (physician) and Advanced Practice Providers (APPs -  Physician Assistants and Nurse Practitioners) who all work together to provide you with the care you need, when you need it. You will need a follow up appointment in 6 months.  Please call our office 2 months in advance to schedule this appointment.  You may see Kathlyn Sacramento, MD or one of the following Advanced Practice Providers on your designated Care Team:   Murray Hodgkins, NP Christell Faith, PA-C . Marrianne Mood, PA-C

## 2018-11-18 ENCOUNTER — Other Ambulatory Visit: Payer: Self-pay | Admitting: Physician Assistant

## 2019-02-15 ENCOUNTER — Other Ambulatory Visit: Payer: Self-pay | Admitting: Cardiovascular Disease

## 2019-02-18 ENCOUNTER — Other Ambulatory Visit: Payer: Self-pay | Admitting: Cardiovascular Disease

## 2019-03-31 ENCOUNTER — Other Ambulatory Visit: Payer: Self-pay

## 2019-03-31 ENCOUNTER — Encounter: Payer: Self-pay | Admitting: Cardiovascular Disease

## 2019-03-31 ENCOUNTER — Ambulatory Visit (INDEPENDENT_AMBULATORY_CARE_PROVIDER_SITE_OTHER): Payer: Medicare Other | Admitting: Cardiovascular Disease

## 2019-03-31 VITALS — BP 136/62 | HR 76 | Ht 66.0 in | Wt 126.2 lb

## 2019-03-31 DIAGNOSIS — E785 Hyperlipidemia, unspecified: Secondary | ICD-10-CM

## 2019-03-31 DIAGNOSIS — I10 Essential (primary) hypertension: Secondary | ICD-10-CM

## 2019-03-31 DIAGNOSIS — I2510 Atherosclerotic heart disease of native coronary artery without angina pectoris: Secondary | ICD-10-CM | POA: Diagnosis not present

## 2019-03-31 NOTE — Patient Instructions (Signed)

## 2019-03-31 NOTE — Progress Notes (Signed)
Cardiology Office Note   Date:  03/31/2019   ID:  Karen, Liu 11-29-1930, MRN [id number]  PCP:  Baxter Hire, MD  Cardiologist:   Kathlyn Sacramento, MD   Chief Complaint  Patient presents with  . OTHER    6 month f/u no complaints today. Meds reviewed verbally with pt.      History of Present Illness: Karen Liu is a 86 y.o. female who presents for a follow-up visit regarding coronary artery disease. 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 She had unstable angina in July 2019. Cardiac catheterization showed 99% stenosis in the mid RCA and stable moderate LAD disease.  The RCA was treated successfully with PCI and drug-eluting stent placement. She had dizziness that was thought to be due to carvedilol which improved after switching back to amlodipine.  Clopidogrel was discontinued 6 months ago.  She has been doing very well with no recent chest pain, shortness of breath or palpitations.   Past Medical History:  Diagnosis Date  . Arthritis    neck  . Brain tumor ([place])   . CAD (coronary artery disease)    a. LHC 7/16: 50% mLAD not sig by FFR (0.9), mild LCx/RCA dz,EF & LVEDP nl; b. LHC 7/19: mLAD 40%, mLCx 30%, ostRCA 30%, mRCA 99% (3.0x26 Resolute DES), nl LVSF, mildly elevated LVEDP.  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 Essential hypertension   . GERD (gastroesophageal reflux disease)   . History of CVA (cerebrovascular accident) 01/02/2011   Overview:  Left  . History of kidney stones   . Hyperlipidemia   . Hypertension   . Motion sickness    cars  . PONV (postoperative nausea and vomiting)   . Vertigo    before brain surgery    Past Surgical History:  Procedure Laterality Date  . ABDOMINAL HYSTERECTOMY    . APPENDECTOMY    . BRAIN SURGERY  brain tumor removed 2013  . CARDIAC CATHETERIZATION N/A 09/21/2014   Procedure: Left Heart Cath and Coronary Angiography;  Surgeon:  Wellington Hampshire, MD;  Location: [place] [facility];  Service: Cardiovascular;  Laterality: N/A;  . CATARACT EXTRACTION W/ INTRAOCULAR LENS  IMPLANT, BILATERAL    . 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 COLONOSCOPY    . CORONARY STENT INTERVENTION N/A 09/21/2017   Procedure: CORONARY STENT INTERVENTION;  Surgeon: Wellington Hampshire, MD;  Location: [place] [facility];  Service: Cardiovascular;  Laterality: N/A;  . ESOPHAGOGASTRODUODENOSCOPY    . ESOPHAGOGASTRODUODENOSCOPY (EGD) WITH PROPOFOL N/A 11/13/2014   Procedure: ESOPHAGOGASTRODUODENOSCOPY (EGD) WITH PROPOFOL;  Surgeon: Manya Silvas, MD;  Location: [facility] ENDOSCOPY;  Service: Endoscopy;  Laterality: N/A;  . LEFT HEART CATH Right 09/21/2017   Procedure: Left Heart Cath;  Surgeon: Wellington Hampshire, MD;  Location: [place] [facility];  Service: Cardiovascular;  Laterality: Right;  . MICROLARYNGOSCOPY N/A 11/22/2014   Procedure: MICROLARYNGOSCOPY DIRECT WITH REMOVAL EPIGLOTTIC CYST;  Surgeon: Beverly Gust, MD;  Location: [place];  Service: ENT;  Laterality: N/A;     Current Outpatient Medications  Medication Sig Dispense Refill  . amLODipine (NORVASC) 2.5 MG tablet TAKE 1 TABLET BY MOUTH EVERY DAY 90 tablet 3  . aspirin EC 81 MG tablet Take 1 tablet by mouth every morning.    Marland Kitchen atorvastatin (LIPITOR) 40 MG tablet TAKE 1 TABLET (40 MG TOTAL) BY MOUTH DAILY AT 6 PM. 90  tablet 0  . calcium-vitamin D (OSCAL WITH D) 500-200 MG-UNIT tablet Take 2 tablets by mouth daily with breakfast.    . cetirizine (ZYRTEC) 10 MG chewable tablet Chew 10 mg by mouth daily as needed.     . cyanocobalamin (V-R VITAMIN B-12) 500 MCG tablet Take 1,000 mcg by mouth every morning.     . famotidine (PEPCID) 20 MG tablet Take 20 mg by mouth daily.    . nitroGLYCERIN (NITROSTAT) 0.4 MG SL tablet Place 1 tablet (0.4 mg total) under the tongue every 5 (five) minutes as needed for chest pain. 25 tablet 3  . pantoprazole  (PROTONIX) 40 MG tablet Take 40 mg by mouth daily.    . potassium citrate (UROCIT-K) 10 MEQ (1080 MG) SR tablet Take 1 tablet (10 mEq total) by mouth 2 (two) times daily. 180 tablet 3   No current facility-administered medications for this visit.    Allergies:   Regadenoson, Other, Phenazopyridine, Pyridium [phenazopyridine hcl], Sulfa antibiotics, Sulfacetamide sodium, and Tamsulosin    Social History:  The patient  reports that she has never smoked. She has never used smokeless tobacco. She reports that she does not drink alcohol or use drugs.   Family History:  The patient's family history includes Heart attack (age of onset: 79) in her mother; Heart disease in her mother; Hyperlipidemia in her mother; Hypertension in her mother.    ROS:  Please see the history of present illness.   Otherwise, review of systems are positive for none.   All other systems are reviewed and negative.    PHYSICAL EXAM: VS:  BP 136/62 (BP Location: Left Arm, Patient Position: Sitting, Cuff Size: Normal)   Pulse 76   Ht 5\' 6"  (1.676 m)   Wt 126 lb 4 oz (57.3 kg)   SpO2 96%   BMI 20.38 kg/m  , BMI Body mass index is 20.38 kg/m. GEN: Well nourished, well developed, in no acute distress  HEENT: normal  Neck: no JVD, carotid bruits, or masses Cardiac: RRR; no murmurs, rubs, or gallops,no edema  Respiratory:  clear to auscultation bilaterally, normal work of breathing GI: soft, nontender, nondistended, + BS MS: no deformity or atrophy  Skin: warm and dry, no rash Neuro:  Strength and sensation are intact Psych: euthymic mood, full affect   EKG:  EKG is ordered today. The ekg ordered today demonstrates normal sinus rhythm with no significant ST or T wave changes.   Recent Labs: 08/16/2018: ALT 14; BUN 20; Creatinine, Ser 0.86; Hemoglobin 11.8; Platelets 268; Potassium 3.4; Sodium 140    Lipid Panel    Component Value Date/Time   CHOL 129 12/23/2017 0850   TRIG 52 12/23/2017 0850   HDL 56  12/23/2017 0850   CHOLHDL 2.3 12/23/2017 0850   LDLCALC 63 12/23/2017 0850   LDLDIRECT 80 11/11/2017 1156      Wt Readings from Last 3 Encounters:  03/31/19 126 lb 4 oz (57.3 kg)  09/28/18 129 lb (58.5 kg)  08/16/18 129 lb 13.6 oz (58.9 kg)        ASSESSMENT AND PLAN:  1.  Coronary artery disease involving native coronary artery without angina: She is doing very well with no anginal symptoms.  Recommend continuing medical therapy.  Continue aspirin indefinitely.  2. Hyperlipidemia: Continue treatment with atorvastatin.  I reviewed most recent lipid profile done in May which showed a triglyceride of 51 and an LDL of 64  3.  Essential hypertension: Blood pressure is reasonably controlled on amlodipine.  Disposition:   FU with me in 6 months  Signed,  Kathlyn Sacramento, MD  03/31/2019 10:25 AM    [place]

## 2019-04-11 ENCOUNTER — Ambulatory Visit
Admission: RE | Admit: 2019-04-11 | Discharge: 2019-04-11 | Disposition: A | Discharge status: Home / Self Care | Payer: Medicare Other | Source: Ambulatory Visit | Attending: Urology | Admitting: Urology

## 2019-04-11 ENCOUNTER — Ambulatory Visit
Admission: RE | Admit: 2019-04-11 | Discharge: 2019-04-11 | Disposition: A | Discharge status: Home / Self Care | Payer: Medicare Other | Attending: Urology | Admitting: Urology

## 2019-04-11 ENCOUNTER — Other Ambulatory Visit: Payer: Self-pay

## 2019-04-11 DIAGNOSIS — N200 Calculus of kidney: Secondary | ICD-10-CM | POA: Insufficient documentation

## 2019-04-15 ENCOUNTER — Other Ambulatory Visit: Payer: Self-pay

## 2019-04-15 ENCOUNTER — Ambulatory Visit (INDEPENDENT_AMBULATORY_CARE_PROVIDER_SITE_OTHER): Payer: Medicare Other | Admitting: Urology

## 2019-04-15 ENCOUNTER — Encounter: Payer: Self-pay | Admitting: Urology

## 2019-04-15 VITALS — BP 125/67 | HR 94 | Ht 66.0 in | Wt 127.0 lb

## 2019-04-15 DIAGNOSIS — N200 Calculus of kidney: Secondary | ICD-10-CM

## 2019-04-15 NOTE — Progress Notes (Signed)
04/15/2019 1:09 PM   Karen Liu Sep 09, 1930 [id number]  Referring provider: Baxter Hire, MD [name],  [place] [id number]  Chief Complaint  Patient presents with  . Nephrolithiasis    HPI: 86 yo female with a history of recurrent uric acid stones.  She continues on Urocit-K.  She was seen in the ED 08/2018 for abdominal pain and had a 5 mm left UVJ stone.  She had a follow-up urology appointment with KUB which showed the stone is passed.  She remains asymptomatic and has no complaints today.  Her CT in June showed 2 small nonobstructing right renal calculi and no left renal calculi.  KUB performed today was reviewed and there are 2 faint calcifications overlying the right renal outline.   PMH: Past Medical History:  Diagnosis Date  . Arthritis    neck  . Brain tumor ([place])   . CAD (coronary artery disease)    a. LHC 7/16: 50% mLAD not sig by FFR (0.9), mild LCx/RCA dz,EF & LVEDP nl; b. LHC 7/19: mLAD 40%, mLCx 30%, ostRCA 30%, mRCA 99% (3.0x26 Resolute DES), nl LVSF, mildly elevated LVEDP.  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 Essential hypertension   . GERD (gastroesophageal reflux disease)   . History of CVA (cerebrovascular accident) 01/02/2011   Overview:  Left  . History of kidney stones   . Hyperlipidemia   . Hypertension   . Motion sickness    cars  . PONV (postoperative nausea and vomiting)   . Vertigo    before brain surgery    Surgical History: Past Surgical History:  Procedure Laterality Date  . ABDOMINAL HYSTERECTOMY    . APPENDECTOMY    . BRAIN SURGERY  brain tumor removed 2013  . CARDIAC CATHETERIZATION N/A 09/21/2014   Procedure: Left Heart Cath and Coronary Angiography;  Surgeon: Wellington Hampshire, MD;  Location: [place] [facility];  Service: Cardiovascular;  Laterality: N/A;  . CATARACT EXTRACTION W/ INTRAOCULAR LENS  IMPLANT, BILATERAL    . 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  COLONOSCOPY    . CORONARY STENT INTERVENTION N/A 09/21/2017   Procedure: CORONARY STENT INTERVENTION;  Surgeon: Wellington Hampshire, MD;  Location: [place] [facility];  Service: Cardiovascular;  Laterality: N/A;  . ESOPHAGOGASTRODUODENOSCOPY    . ESOPHAGOGASTRODUODENOSCOPY (EGD) WITH PROPOFOL N/A 11/13/2014   Procedure: ESOPHAGOGASTRODUODENOSCOPY (EGD) WITH PROPOFOL;  Surgeon: Manya Silvas, MD;  Location: [facility] ENDOSCOPY;  Service: Endoscopy;  Laterality: N/A;  . LEFT HEART CATH Right 09/21/2017   Procedure: Left Heart Cath;  Surgeon: Wellington Hampshire, MD;  Location: [place] [facility];  Service: Cardiovascular;  Laterality: Right;  . MICROLARYNGOSCOPY N/A 11/22/2014   Procedure: MICROLARYNGOSCOPY DIRECT WITH REMOVAL EPIGLOTTIC CYST;  Surgeon: Beverly Gust, MD;  Location: [place];  Service: ENT;  Laterality: N/A;    Home Medications:  Allergies as of 04/15/2019      Reactions   Regadenoson    Severe chest pain, dyspnea and distress (prolonged) Severe chest pain, dyspnea and distress (prolonged)   Other Rash   Other reaction(s): HIVES   Phenazopyridine Rash   Pyridium [phenazopyridine Hcl] Rash   Sulfa Antibiotics Rash   Other reaction(s): HIVES   Sulfacetamide Sodium Rash   Other reaction(s): HIVES   Tamsulosin Nausea And Vomiting      Medication List       Accurate as of April 15, 2019  1:09 PM. If you  have any questions, ask your nurse or doctor.        amLODipine 2.5 MG tablet Commonly known as: NORVASC TAKE 1 TABLET BY MOUTH EVERY DAY   aspirin EC 81 MG tablet Take 1 tablet by mouth every morning.   atorvastatin 40 MG tablet Commonly known as: LIPITOR TAKE 1 TABLET (40 MG TOTAL) BY MOUTH DAILY AT 6 PM.   calcium-vitamin D 500-200 MG-UNIT tablet Commonly known as: OSCAL WITH D Take 2 tablets by mouth daily with breakfast.   cetirizine 10 MG chewable tablet Commonly known as: ZYRTEC Chew 10 mg by mouth daily as needed.   famotidine 20 MG  tablet Commonly known as: PEPCID Take 20 mg by mouth daily.   nitroGLYCERIN 0.4 MG SL tablet Commonly known as: NITROSTAT Place 1 tablet (0.4 mg total) under the tongue every 5 (five) minutes as needed for chest pain.   pantoprazole 40 MG tablet Commonly known as: PROTONIX Take 40 mg by mouth daily.   potassium citrate 10 MEQ (1080 MG) SR tablet Commonly known as: UROCIT-K Take 1 tablet (10 mEq total) by mouth 2 (two) times daily.   V-R VITAMIN B-12 500 MCG tablet Generic drug: vitamin B-12 Take 1,000 mcg by mouth every morning.       Allergies:  Allergies  Allergen Reactions  . Regadenoson     Severe chest pain, dyspnea and distress (prolonged) Severe chest pain, dyspnea and distress (prolonged)  . Other Rash    Other reaction(s): HIVES   . Phenazopyridine Rash  . Pyridium [Phenazopyridine Hcl] Rash  . Sulfa Antibiotics Rash    Other reaction(s): HIVES  . Sulfacetamide Sodium Rash    Other reaction(s): HIVES  . Tamsulosin Nausea And Vomiting    Family History: Family History  Problem Relation Age of Onset  . Heart attack Mother 67  . Heart disease Mother   . Hypertension Mother   . Hyperlipidemia Mother   . Breast cancer Neg Hx     Social History:  reports that she has never smoked. She has never used smokeless tobacco. She reports that she does not drink alcohol or use drugs.  ROS: As per the HPI otherwise noncontributory  Physical Exam: BP 125/67   Pulse 94   Ht 5\' 6"  (1.676 m)   Wt 127 lb (57.6 kg)   BMI 20.50 kg/m   Constitutional:  Alert and oriented, No acute distress. HEENT: Winter AT, moist mucus membranes.  Trachea midline, no masses. Cardiovascular: No clubbing, cyanosis, or edema. Respiratory: Normal respiratory effort, no increased work of breathing. Skin: No rashes, bruises or suspicious lesions. Neurologic: Grossly intact, no focal deficits, moving all 4 extremities. Psychiatric: Normal mood and affect.   Assessment & Plan:    -  Nephrolithiasis Small, nonobstructing right renal calculi.  Continue annual follow-up with KUB.  Call prn recurrent stone pain   Abbie Sons, MD  [facility] [address], [place], [place] [address] [phone]

## 2019-04-26 ENCOUNTER — Other Ambulatory Visit: Payer: Self-pay | Admitting: Internal Medicine

## 2019-04-26 DIAGNOSIS — Z1231 Encounter for screening mammogram for malignant neoplasm of breast: Secondary | ICD-10-CM

## 2019-05-15 ENCOUNTER — Other Ambulatory Visit: Payer: Self-pay | Admitting: Urology

## 2019-05-17 ENCOUNTER — Other Ambulatory Visit: Payer: Self-pay | Admitting: Cardiovascular Disease

## 2019-05-25 ENCOUNTER — Ambulatory Visit
Admission: RE | Admit: 2019-05-25 | Discharge: 2019-05-25 | Disposition: A | Discharge status: Home / Self Care | Payer: Medicare Other | Source: Ambulatory Visit | Attending: Internal Medicine | Admitting: Internal Medicine

## 2019-05-25 DIAGNOSIS — Z1231 Encounter for screening mammogram for malignant neoplasm of breast: Secondary | ICD-10-CM | POA: Insufficient documentation

## 2019-08-08 ENCOUNTER — Other Ambulatory Visit: Payer: Self-pay

## 2019-08-08 ENCOUNTER — Other Ambulatory Visit: Payer: Self-pay | Admitting: Cardiovascular Disease

## 2019-08-08 MED ORDER — ATORVASTATIN CALCIUM 40 MG PO TABS: 40.0000 mg | ORAL_TABLET | Freq: Every day | ORAL | 0 refills | Status: DC

## 2019-08-08 MED ORDER — ATORVASTATIN CALCIUM 40 MG PO TABS: 40.0000 mg | ORAL_TABLET | Freq: Every day | ORAL | 3 refills | Status: DC

## 2019-08-08 NOTE — Telephone Encounter (Signed)
*  STAT* If patient is at the pharmacy, call can be transferred to refill team.   1. Which medications need to be refilled? (please list name of each medication and dose if known) atorvastatin 40 mg  2. Which pharmacy/location (including street and city if local pharmacy) is medication to be sent to? [facility] [place]  3. Do they need a 30 day or 90 day supply? 90  Patient only has 4 remaining

## 2019-08-08 NOTE — Telephone Encounter (Signed)
Requested Prescriptions   Signed Prescriptions Disp Refills   atorvastatin (LIPITOR) 40 MG tablet 90 tablet 0    Sig: Take 1 tablet (40 mg total) by mouth daily at 6 PM.    Authorizing Provider: Kathlyn Sacramento A    Ordering User: Raelene Bott, Anyssa Sharpless L

## 2019-08-25 IMAGING — US US RENAL
1 series · 14 of 25 positions shown · non-contrast
Comparison: Body CT 02/18/2014

CLINICAL DATA: Nephrolithiasis.

EXAM:
RENAL / URINARY TRACT ULTRASOUND COMPLETE

[Series 1: us renal · 0.22mm/px · 14 of 62 slices shown]
[im 1/62]
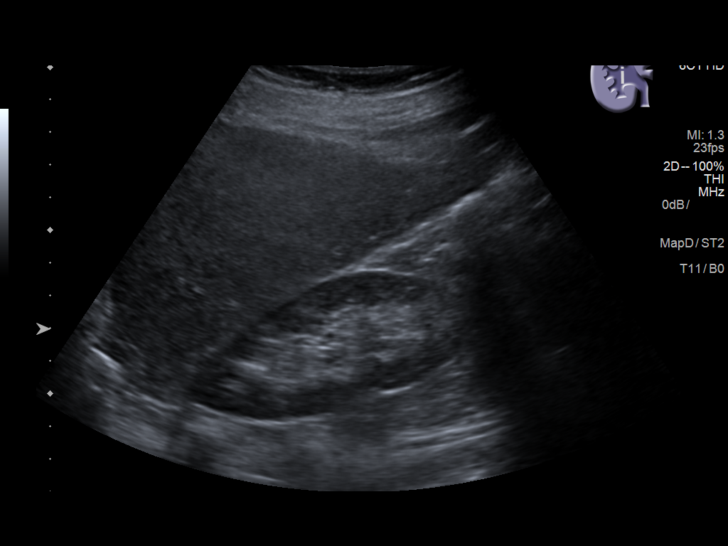
[im 6/62]
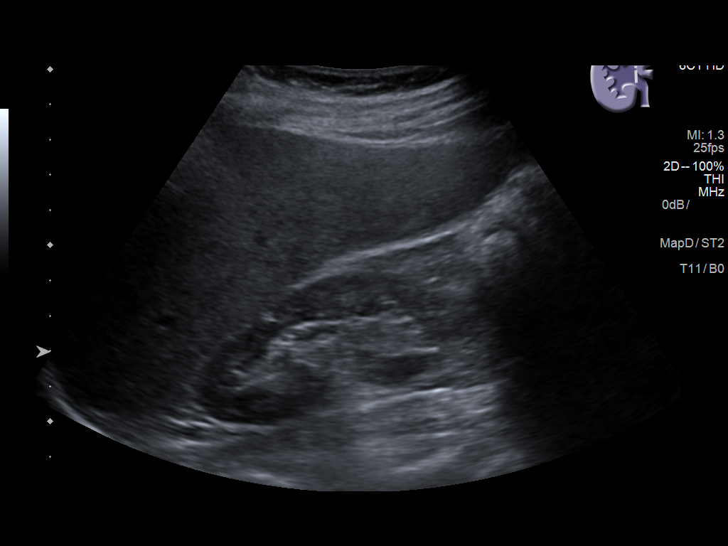
[im 11/62]
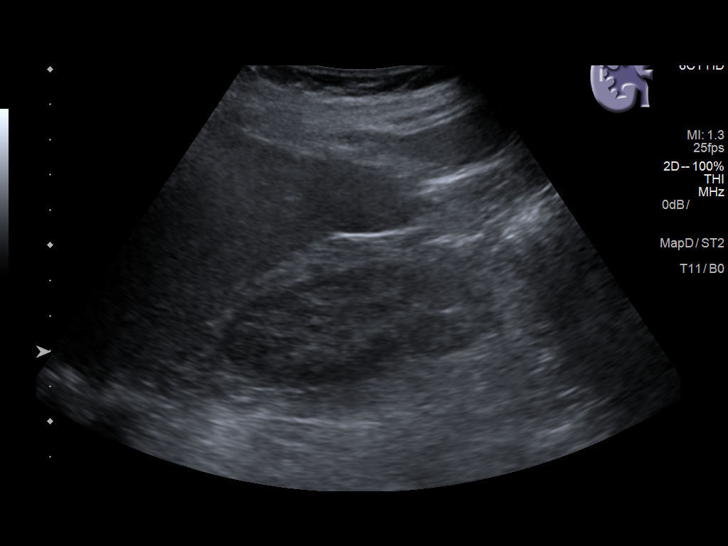
[im 16/62]
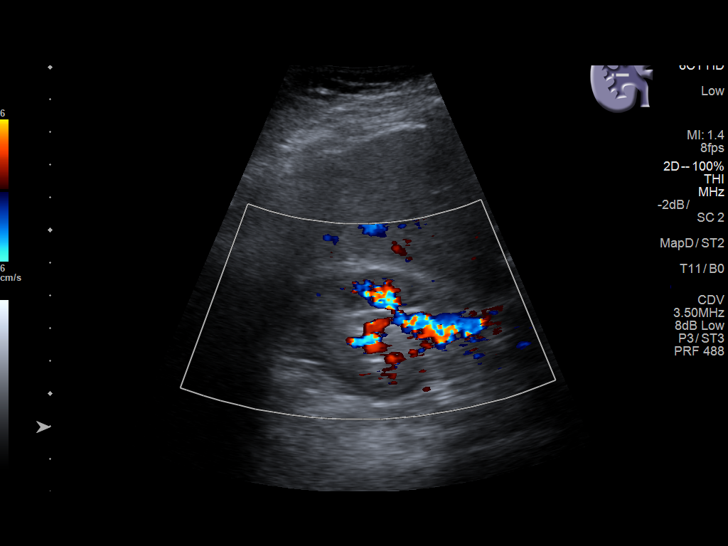
[im 21/62]
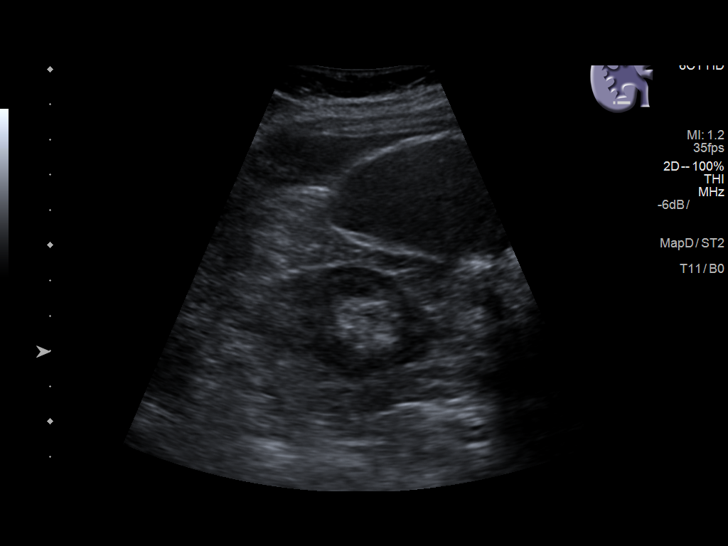
[im 23/62]
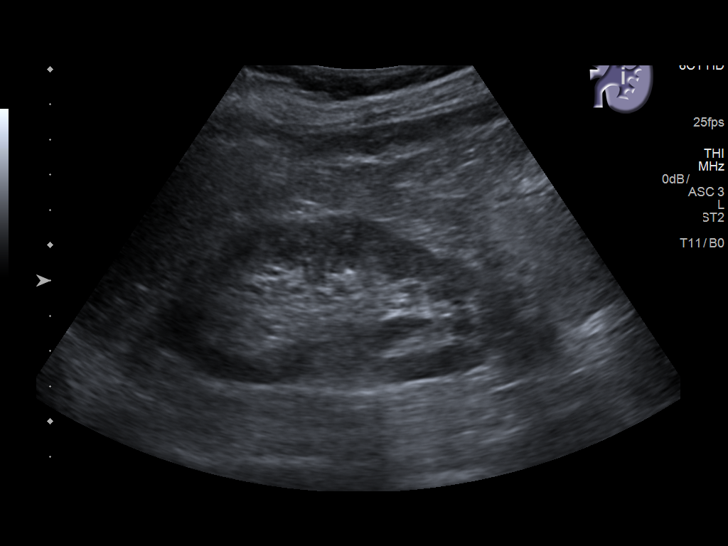
[im 28/62]
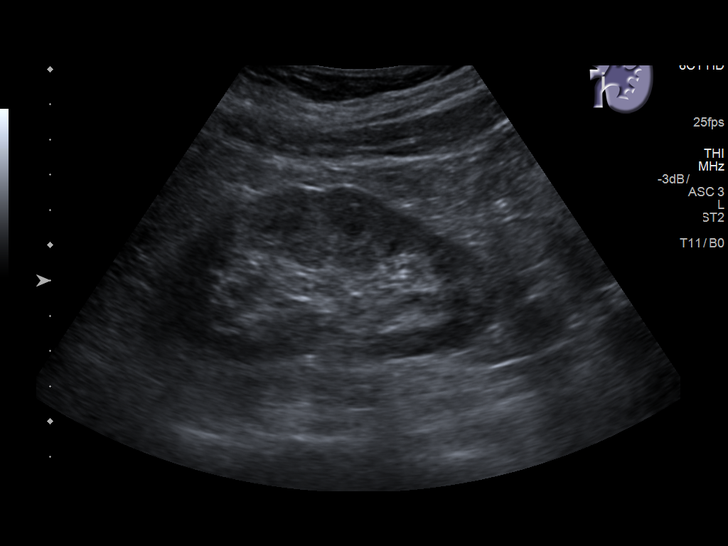
[im 34/62]
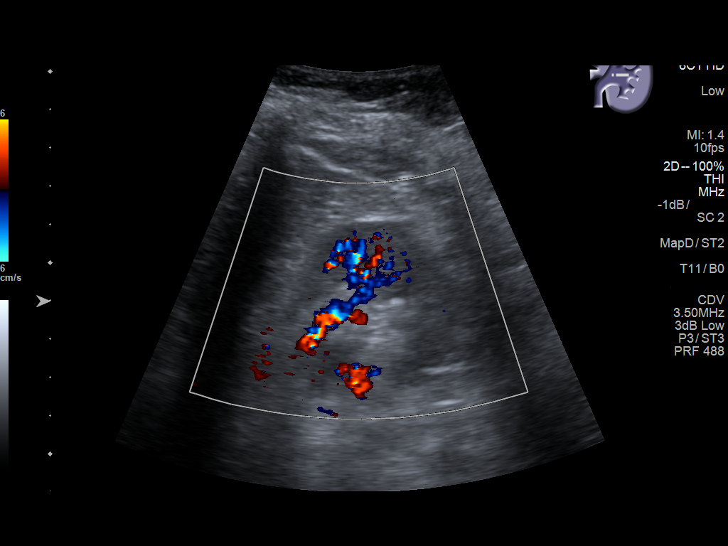
[im 39/62]
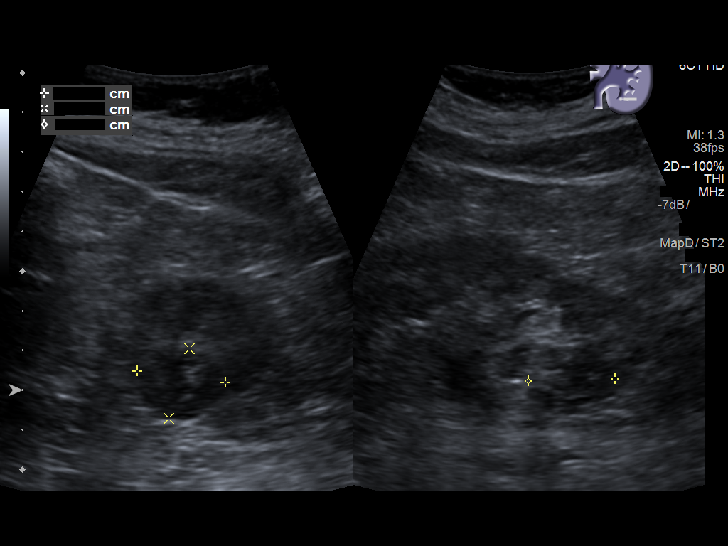
[im 41/62]
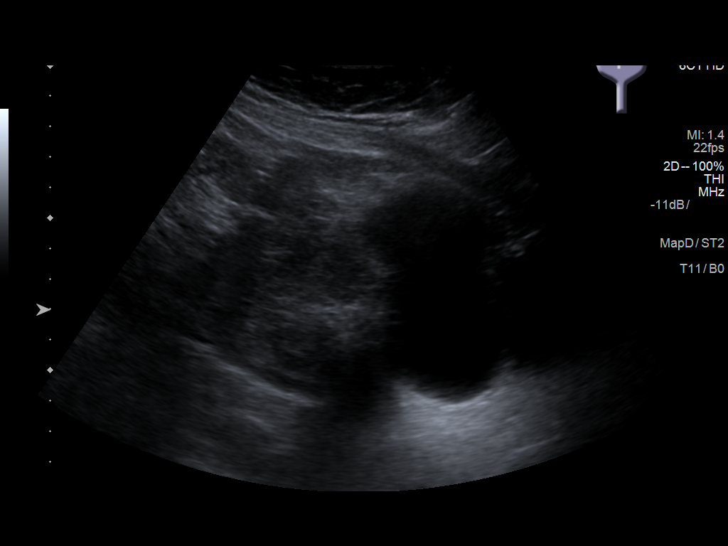
[im 46/62]
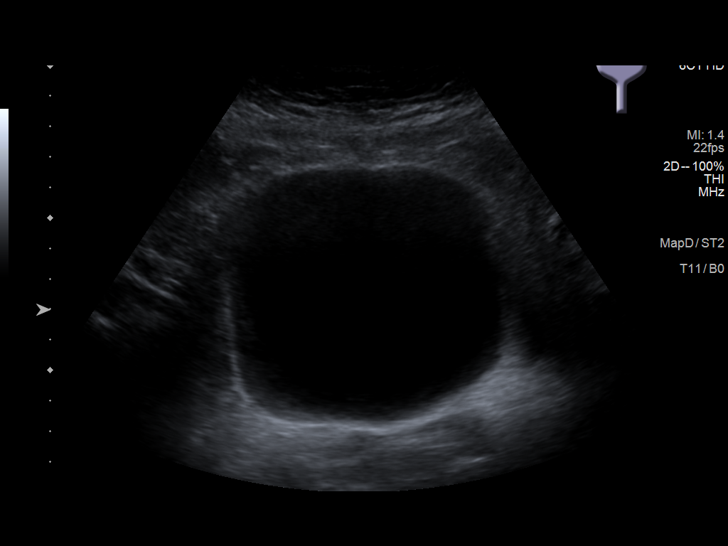
[im 51/62]
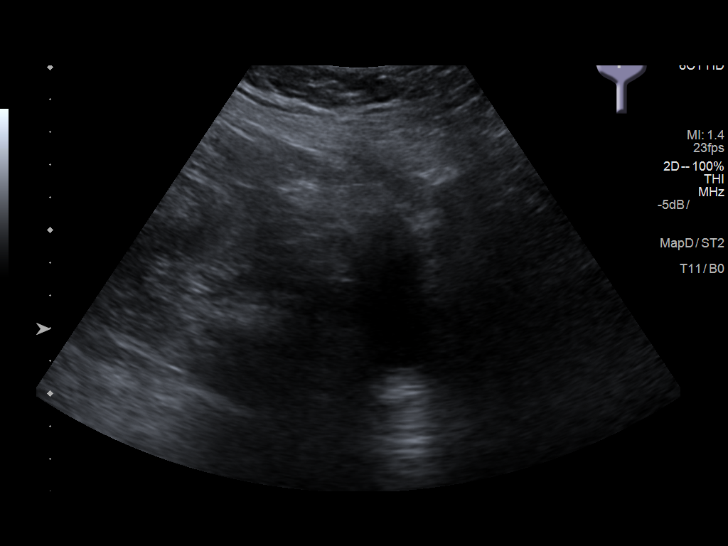
[im 56/62]
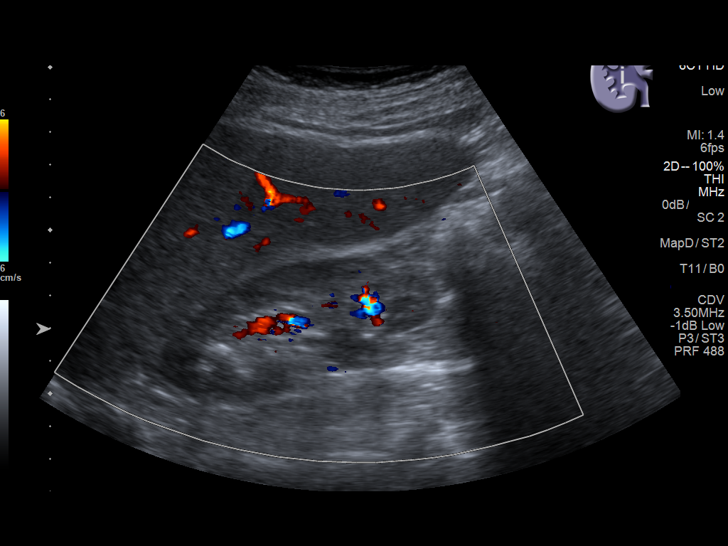
[im 62/62]
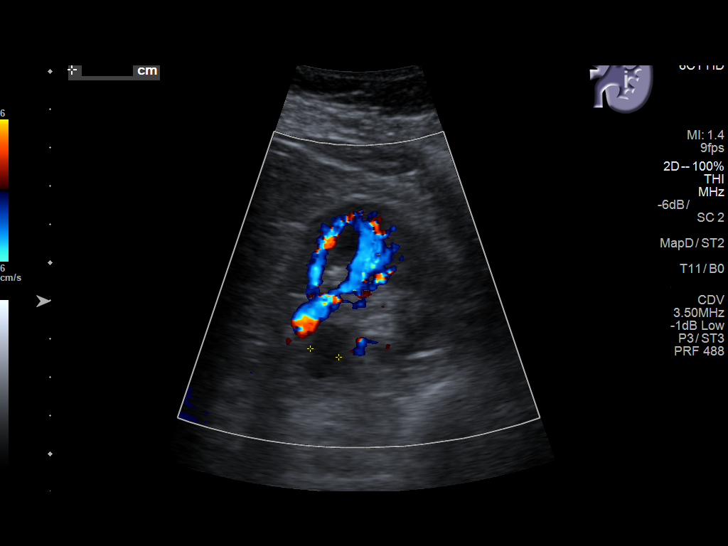

[14 of 25 positions shown; findings below may reference images not displayed]

FINDINGS: Right Kidney:

Length: 9.2 cm. Echogenicity within normal limits. There is a mild
pelviectasis. 4 mm nonobstructive calculus in the superior pole of
the right kidney.

Left Kidney:

Length: 10.4 cm. Echogenicity within normal limits. There is a mild
pelviectasis. Heterogeneous in echogenicity solid mass, partially
exophytic off of the inferior pole of the left kidney measures 2.1 x
1.7 x 2.2 cm.

Bladder:

Appears normal for degree of bladder distention. Bilateral ureteral
jets seen. Incomplete voiding noted.
IMPRESSION: 2.2 cm solid-appearing left renal mass, suspicious for renal cell
carcinoma.

Mild bilateral pelviectasis.

Nonobstructive right nephrolithiasis.

These results will be called to the ordering clinician or
representative by the Radiologist Assistant, and communication
documented in the PACS or zVision Dashboard.

## 2019-09-29 ENCOUNTER — Other Ambulatory Visit: Payer: Self-pay

## 2019-09-29 ENCOUNTER — Encounter: Payer: Self-pay | Admitting: Cardiovascular Disease

## 2019-09-29 ENCOUNTER — Ambulatory Visit (INDEPENDENT_AMBULATORY_CARE_PROVIDER_SITE_OTHER): Payer: Medicare Other | Admitting: Cardiovascular Disease

## 2019-09-29 VITALS — BP 126/52 | HR 88 | Ht 66.0 in | Wt 127.1 lb

## 2019-09-29 DIAGNOSIS — I10 Essential (primary) hypertension: Secondary | ICD-10-CM | POA: Diagnosis not present

## 2019-09-29 DIAGNOSIS — I2510 Atherosclerotic heart disease of native coronary artery without angina pectoris: Secondary | ICD-10-CM | POA: Diagnosis not present

## 2019-09-29 DIAGNOSIS — E785 Hyperlipidemia, unspecified: Secondary | ICD-10-CM | POA: Diagnosis not present

## 2019-09-29 NOTE — Patient Instructions (Signed)

## 2019-09-29 NOTE — Progress Notes (Signed)
Cardiology Office Note   Date:  09/29/2019   ID:  JENNYLEE Liu, DOB May 03, 1930, MRN [id number]  PCP:  Baxter Hire, MD  Cardiologist:   Kathlyn Sacramento, MD   Chief Complaint  Patient presents with  . other    6 month follow up. Meds reviewed by the pt. verbally. "doing well."       History of Present Illness: Karen Liu is a 86 y.o. female who presents for a follow-up visit regarding coronary artery disease. 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 She had unstable angina in July 2019. Cardiac catheterization showed 99% stenosis in the mid RCA and stable moderate LAD disease.  The RCA was treated successfully with PCI and drug-eluting stent placement. She had dizziness that was thought to be due to carvedilol which improved after switching back to amlodipine.   She has been doing very well with no recent chest pain, shortness of breath or palpitations.   Past Medical History:  Diagnosis Date  . Arthritis    neck  . Brain tumor ([place])   . CAD (coronary artery disease)    a. LHC 7/16: 50% mLAD not sig by FFR (0.9), mild LCx/RCA dz,EF & LVEDP nl; b. LHC 7/19: mLAD 40%, mLCx 30%, ostRCA 30%, mRCA 99% (3.0x26 Resolute DES), nl LVSF, mildly elevated LVEDP.  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 Essential hypertension   . GERD (gastroesophageal reflux disease)   . History of CVA (cerebrovascular accident) 01/02/2011   Overview:  Left  . History of kidney stones   . Hyperlipidemia   . Hypertension   . Motion sickness    cars  . PONV (postoperative nausea and vomiting)   . Vertigo    before brain surgery    Past Surgical History:  Procedure Laterality Date  . ABDOMINAL HYSTERECTOMY    . APPENDECTOMY    . BRAIN SURGERY  brain tumor removed 2013  . CARDIAC CATHETERIZATION N/A 09/21/2014   Procedure: Left Heart Cath and Coronary Angiography;  Surgeon: Wellington Hampshire, MD;  Location: [place] [facility];  Service: Cardiovascular;  Laterality: N/A;  . CATARACT EXTRACTION W/ INTRAOCULAR LENS  IMPLANT, BILATERAL    . 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 COLONOSCOPY    . CORONARY STENT INTERVENTION N/A 09/21/2017   Procedure: CORONARY STENT INTERVENTION;  Surgeon: Wellington Hampshire, MD;  Location: [place] [facility];  Service: Cardiovascular;  Laterality: N/A;  . ESOPHAGOGASTRODUODENOSCOPY    . ESOPHAGOGASTRODUODENOSCOPY (EGD) WITH PROPOFOL N/A 11/13/2014   Procedure: ESOPHAGOGASTRODUODENOSCOPY (EGD) WITH PROPOFOL;  Surgeon: Manya Silvas, MD;  Location: [facility] ENDOSCOPY;  Service: Endoscopy;  Laterality: N/A;  . LEFT HEART CATH Right 09/21/2017   Procedure: Left Heart Cath;  Surgeon: Wellington Hampshire, MD;  Location: [place] [facility];  Service: Cardiovascular;  Laterality: Right;  . MICROLARYNGOSCOPY N/A 11/22/2014   Procedure: MICROLARYNGOSCOPY DIRECT WITH REMOVAL EPIGLOTTIC CYST;  Surgeon: Beverly Gust, MD;  Location: [place];  Service: ENT;  Laterality: N/A;     Current Outpatient Medications  Medication Sig Dispense Refill  . amLODipine (NORVASC) 2.5 MG tablet TAKE 1 TABLET BY MOUTH EVERY DAY 90 tablet 3  . aspirin EC 81 MG tablet Take 1 tablet by mouth every morning.    Marland Kitchen atorvastatin (LIPITOR) 40 MG tablet Take 1 tablet (40 mg total) by mouth daily at 6 PM. 90 tablet 3  .  calcium-vitamin D (OSCAL WITH D) 500-200 MG-UNIT tablet Take 2 tablets by mouth daily with breakfast.    . cetirizine (ZYRTEC) 10 MG chewable tablet Chew 10 mg by mouth daily as needed.     . cyanocobalamin (V-R VITAMIN B-12) 500 MCG tablet Take 1,000 mcg by mouth every morning.     . famotidine (PEPCID) 20 MG tablet Take 20 mg by mouth daily.    . nitroGLYCERIN (NITROSTAT) 0.4 MG SL tablet Place 1 tablet (0.4 mg total) under the tongue every 5 (five) minutes as needed for chest pain. 25 tablet 3  . pantoprazole (PROTONIX) 40 MG tablet Take 40 mg by  mouth daily.    . potassium citrate (UROCIT-K) 10 MEQ (1080 MG) SR tablet TAKE 1 TABLET (10 MEQ TOTAL) BY MOUTH 2 (TWO) TIMES DAILY. 180 tablet 3   No current facility-administered medications for this visit.    Allergies:   Regadenoson, Other, Phenazopyridine, Pyridium [phenazopyridine hcl], Sulfa antibiotics, Sulfacetamide sodium, and Tamsulosin    Social History:  The patient  reports that she has never smoked. She has never used smokeless tobacco. She reports that she does not drink alcohol and does not use drugs.   Family History:  The patient's family history includes Heart attack (age of onset: 59) in her mother; Heart disease in her mother; Hyperlipidemia in her mother; Hypertension in her mother.    ROS:  Please see the history of present illness.   Otherwise, review of systems are positive for none.   All other systems are reviewed and negative.    PHYSICAL EXAM: VS:  BP (!) 126/52 (BP Location: Left Arm, Patient Position: Sitting, Cuff Size: Normal)   Pulse 88   Ht 5\' 6"  (1.676 m)   Wt 127 lb 2 oz (57.7 kg)   SpO2 99%   BMI 20.52 kg/m  , BMI Body mass index is 20.52 kg/m. GEN: Well nourished, well developed, in no acute distress  HEENT: normal  Neck: no JVD, carotid bruits, or masses Cardiac: RRR; no murmurs, rubs, or gallops,no edema  Respiratory:  clear to auscultation bilaterally, normal work of breathing GI: soft, nontender, nondistended, + BS MS: no deformity or atrophy  Skin: warm and dry, no rash Neuro:  Strength and sensation are intact Psych: euthymic mood, full affect   EKG:  EKG is ordered today. The ekg ordered today demonstrates normal sinus rhythm with no significant ST or T wave changes.   Recent Labs: No results found for requested labs within last 8760 hours.    Lipid Panel    Component Value Date/Time   CHOL 129 12/23/2017 0850   TRIG 52 12/23/2017 0850   HDL 56 12/23/2017 0850   CHOLHDL 2.3 12/23/2017 0850   LDLCALC 63 12/23/2017 0850    LDLDIRECT 80 11/11/2017 1156      Wt Readings from Last 3 Encounters:  09/29/19 127 lb 2 oz (57.7 kg)  04/15/19 127 lb (57.6 kg)  03/31/19 126 lb 4 oz (57.3 kg)        ASSESSMENT AND PLAN:  1.  Coronary artery disease involving native coronary artery without angina: She is doing very well with no anginal symptoms.  Recommend continuing medical therapy.  Continue aspirin indefinitely.  2. Hyperlipidemia: Continue treatment with atorvastatin.  I reviewed most recent lipid profile done in May which showed an LDL of 62 and triglyceride of 55.  The rest of her labs were unremarkable except for mild anemia.  3.  Essential hypertension: Blood pressure is reasonably  controlled on amlodipine.   Disposition:   FU with me in 6 months  Signed,  Kathlyn Sacramento, MD  09/29/2019 1:18 PM    [place] [facility]

## 2019-11-24 IMAGING — DX DG CHEST 1V PORT
1 series · 1 of 1 positions shown · non-contrast
Comparison: Prior radiograph from 09/11/2014.

CLINICAL DATA: Initial evaluation for acute chest pain.

EXAM:
PORTABLE CHEST 1 VIEW

[chest ap]
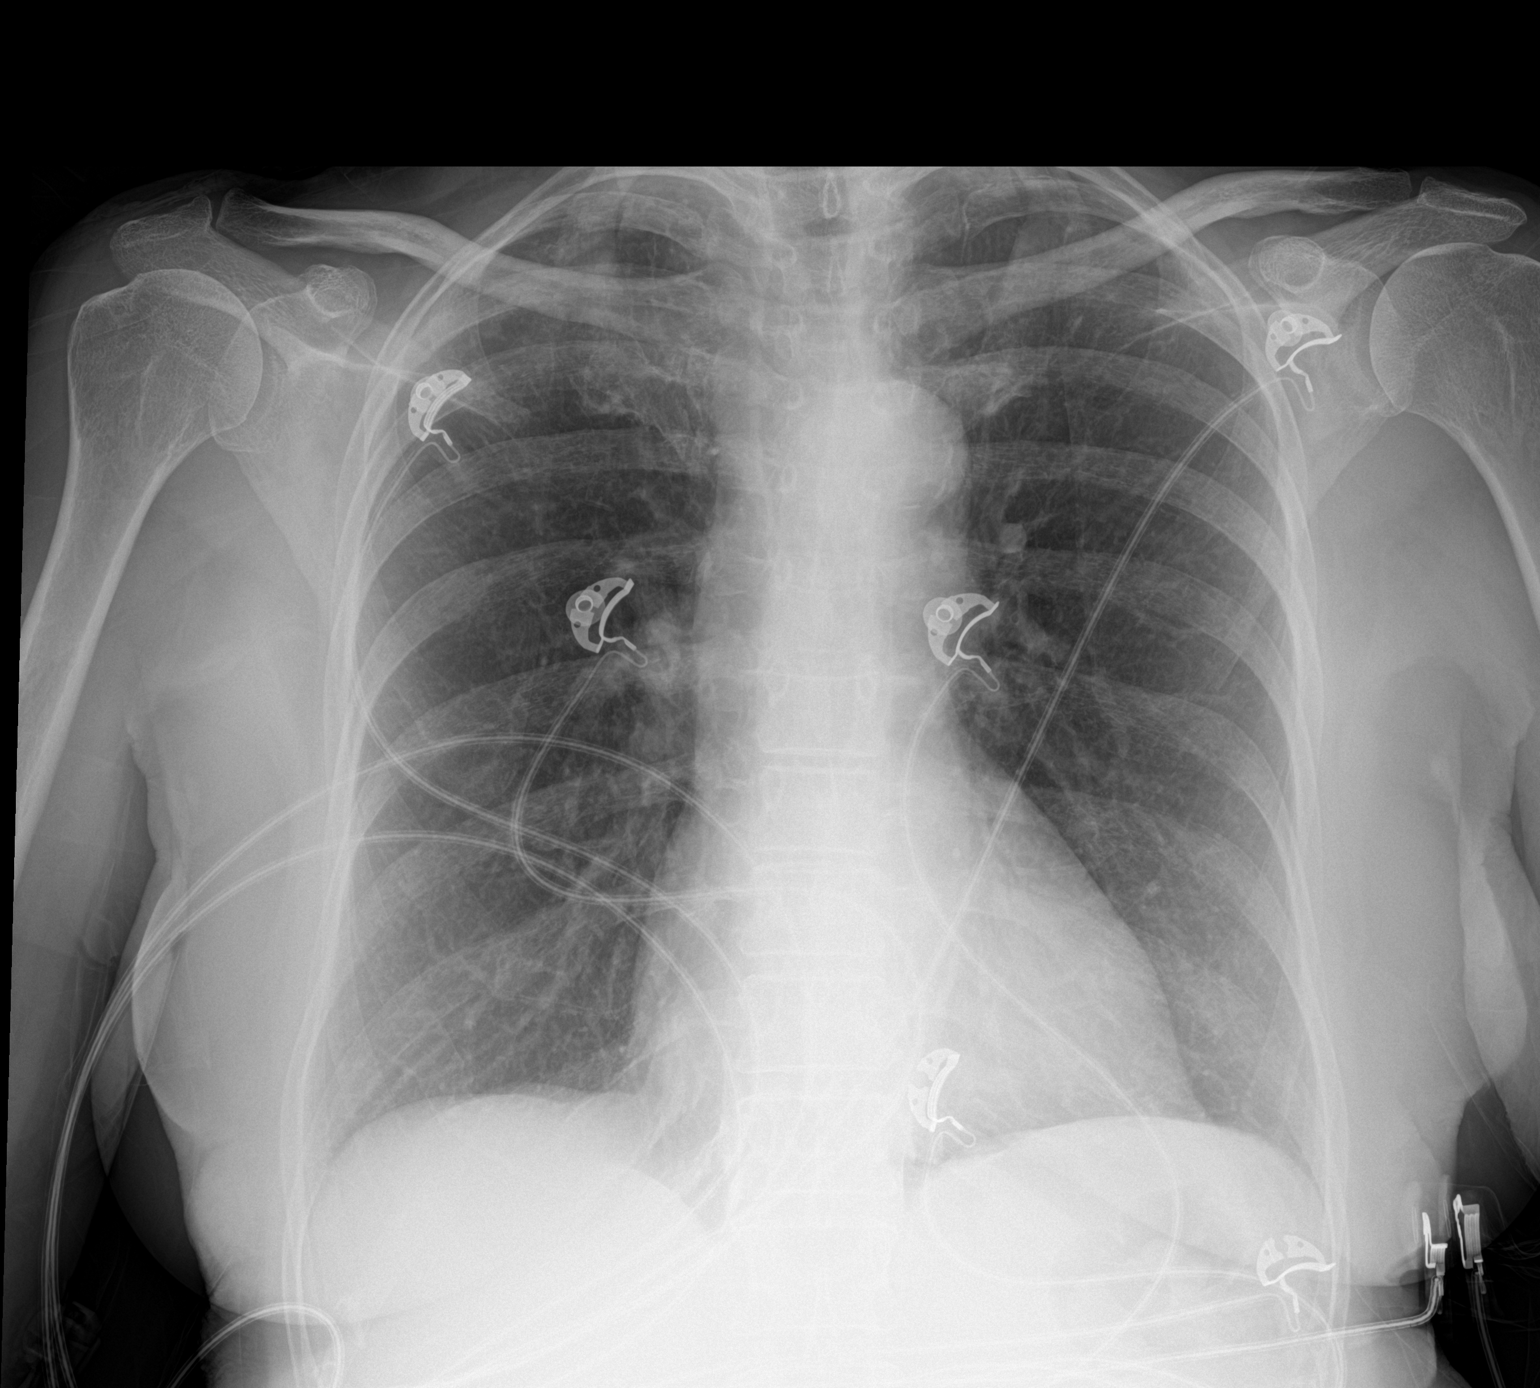

[1 of 1 positions shown; findings below may reference images not displayed]

FINDINGS: The cardiac and mediastinal silhouettes are stable in size and
contour, and remain within normal limits. Aortic atherosclerosis.

The lungs are normally inflated. No airspace consolidation, pleural
effusion, or pulmonary edema is identified. There is no
pneumothorax.

No acute osseous abnormality identified.
IMPRESSION: 1. No active cardiopulmonary disease.
2. Aortic atherosclerosis.

## 2020-02-14 ENCOUNTER — Other Ambulatory Visit: Payer: Self-pay | Admitting: Physician Assistant

## 2020-02-14 NOTE — Telephone Encounter (Signed)
Rx request sent to pharmacy.  

## 2020-03-15 ENCOUNTER — Encounter: Payer: Self-pay | Admitting: Cardiovascular Disease

## 2020-03-15 ENCOUNTER — Ambulatory Visit (INDEPENDENT_AMBULATORY_CARE_PROVIDER_SITE_OTHER): Payer: Medicare Other | Admitting: Cardiovascular Disease

## 2020-03-15 ENCOUNTER — Other Ambulatory Visit: Payer: Self-pay

## 2020-03-15 VITALS — BP 132/64 | HR 74 | Ht 66.0 in | Wt 123.0 lb

## 2020-03-15 DIAGNOSIS — I10 Essential (primary) hypertension: Secondary | ICD-10-CM | POA: Diagnosis not present

## 2020-03-15 DIAGNOSIS — E785 Hyperlipidemia, unspecified: Secondary | ICD-10-CM | POA: Diagnosis not present

## 2020-03-15 DIAGNOSIS — I2510 Atherosclerotic heart disease of native coronary artery without angina pectoris: Secondary | ICD-10-CM | POA: Diagnosis not present

## 2020-03-15 NOTE — Progress Notes (Signed)
Cardiology Office Note   Date:  03/15/2020   ID:  Karen Liu, DOB [birth year], MRN [id number]  PCP:  Baxter Hire, MD  Cardiologist:   Kathlyn Sacramento, MD   Chief Complaint  Patient presents with  . Follow-up    6 Months follow up. Medications verbally reviewed with patient.       History of Present Illness: Karen Liu is a 87 y.o. female who presents for a follow-up visit regarding coronary artery disease. 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 She had unstable angina in July 2019. Cardiac catheterization showed 99% stenosis in the mid RCA and stable moderate LAD disease.  The RCA was treated successfully with PCI and drug-eluting stent placement. She had dizziness that was thought to be due to carvedilol which improved after switching back to amlodipine.   She has been doing very well with no recent chest pain, shortness of breath or palpitations.  No issues related to COVID.  She received 3 doses of the vaccine.   Past Medical History:  Diagnosis Date  . Arthritis    neck  . Brain tumor ([place])   . CAD (coronary artery disease)    a. LHC 7/16: 50% mLAD not sig by FFR (0.9), mild LCx/RCA dz,EF & LVEDP nl; b. LHC 7/19: mLAD 40%, mLCx 30%, ostRCA 30%, mRCA 99% (3.0x26 Resolute DES), nl LVSF, mildly elevated LVEDP.  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 Essential hypertension   . GERD (gastroesophageal reflux disease)   . History of CVA (cerebrovascular accident) 01/02/2011   Overview:  Left  . History of kidney stones   . Hyperlipidemia   . Hypertension   . Motion sickness    cars  . PONV (postoperative nausea and vomiting)   . Vertigo    before brain surgery    Past Surgical History:  Procedure Laterality Date  . ABDOMINAL HYSTERECTOMY    . APPENDECTOMY    . BRAIN SURGERY  brain tumor removed 2013  . CARDIAC CATHETERIZATION N/A 09/21/2014   Procedure: Left Heart Cath and Coronary  Angiography;  Surgeon: Wellington Hampshire, MD;  Location: [place] [facility];  Service: Cardiovascular;  Laterality: N/A;  . CATARACT EXTRACTION W/ INTRAOCULAR LENS  IMPLANT, BILATERAL    . 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 COLONOSCOPY    . CORONARY STENT INTERVENTION N/A 09/21/2017   Procedure: CORONARY STENT INTERVENTION;  Surgeon: Wellington Hampshire, MD;  Location: [place] [facility];  Service: Cardiovascular;  Laterality: N/A;  . ESOPHAGOGASTRODUODENOSCOPY    . ESOPHAGOGASTRODUODENOSCOPY (EGD) WITH PROPOFOL N/A 11/13/2014   Procedure: ESOPHAGOGASTRODUODENOSCOPY (EGD) WITH PROPOFOL;  Surgeon: Manya Silvas, MD;  Location: [facility] ENDOSCOPY;  Service: Endoscopy;  Laterality: N/A;  . LEFT HEART CATH Right 09/21/2017   Procedure: Left Heart Cath;  Surgeon: Wellington Hampshire, MD;  Location: [place] [facility];  Service: Cardiovascular;  Laterality: Right;  . MICROLARYNGOSCOPY N/A 11/22/2014   Procedure: MICROLARYNGOSCOPY DIRECT WITH REMOVAL EPIGLOTTIC CYST;  Surgeon: Beverly Gust, MD;  Location: [place];  Service: ENT;  Laterality: N/A;     Current Outpatient Medications  Medication Sig Dispense Refill  . amLODipine (NORVASC) 2.5 MG tablet TAKE 1 TABLET BY MOUTH EVERY DAY 90 tablet 3  . aspirin EC 81 MG tablet Take 1 tablet by mouth every morning.    Marland Kitchen atorvastatin (LIPITOR) 40 MG tablet Take 1 tablet (40 mg total)  by mouth daily at 6 PM. 90 tablet 3  . calcium-vitamin D (OSCAL WITH D) 500-200 MG-UNIT tablet Take 2 tablets by mouth daily with breakfast.    . cetirizine (ZYRTEC) 10 MG chewable tablet Chew 10 mg by mouth daily as needed.     . famotidine (PEPCID) 20 MG tablet Take 20 mg by mouth daily.    . famotidine (PEPCID) 40 MG tablet Take 40 mg by mouth daily.    . nitroGLYCERIN (NITROSTAT) 0.4 MG SL tablet Place 1 tablet (0.4 mg total) under the tongue every 5 (five) minutes as needed for chest pain. 25 tablet 3  . pantoprazole  (PROTONIX) 40 MG tablet Take 40 mg by mouth daily.    . potassium citrate (UROCIT-K) 10 MEQ (1080 MG) SR tablet TAKE 1 TABLET (10 MEQ TOTAL) BY MOUTH 2 (TWO) TIMES DAILY. 180 tablet 3  . vitamin B-12 (CYANOCOBALAMIN) 500 MCG tablet Take 1,000 mcg by mouth every morning.      No current facility-administered medications for this visit.    Allergies:   Regadenoson, Other, Phenazopyridine, Pyridium [phenazopyridine hcl], Sulfa antibiotics, Sulfacetamide sodium, and Tamsulosin    Social History:  The patient  reports that she has never smoked. She has never used smokeless tobacco. She reports that she does not drink alcohol and does not use drugs.   Family History:  The patient's family history includes Heart attack (age of onset: 10) in her mother; Heart disease in her mother; Hyperlipidemia in her mother; Hypertension in her mother.    ROS:  Please see the history of present illness.   Otherwise, review of systems are positive for none.   All other systems are reviewed and negative.    PHYSICAL EXAM: VS:  BP 132/64 (BP Location: Left Arm, Patient Position: Sitting, Cuff Size: Normal)   Pulse 74   Ht 5\' 6"  (1.676 m)   Wt 123 lb (55.8 kg)   SpO2 98%   BMI 19.85 kg/m  , BMI Body mass index is 19.85 kg/m. GEN: Well nourished, well developed, in no acute distress  HEENT: normal  Neck: no JVD, carotid bruits, or masses Cardiac: RRR; no murmurs, rubs, or gallops,no edema  Respiratory:  clear to auscultation bilaterally, normal work of breathing GI: soft, nontender, nondistended, + BS MS: no deformity or atrophy  Skin: warm and dry, no rash Neuro:  Strength and sensation are intact Psych: euthymic mood, full affect   EKG:  EKG is ordered today. The ekg ordered today demonstrates normal sinus rhythm with no significant ST or T wave changes.   Recent Labs: No results found for requested labs within last 8760 hours.    Lipid Panel    Component Value Date/Time   CHOL 129  12/23/2017 0850   TRIG 52 12/23/2017 0850   HDL 56 12/23/2017 0850   CHOLHDL 2.3 12/23/2017 0850   LDLCALC 63 12/23/2017 0850   LDLDIRECT 80 11/11/2017 1156      Wt Readings from Last 3 Encounters:  03/15/20 123 lb (55.8 kg)  09/29/19 127 lb 2 oz (57.7 kg)  04/15/19 127 lb (57.6 kg)        ASSESSMENT AND PLAN:  1.  Coronary artery disease involving native coronary artery without angina: She is doing very well with no anginal symptoms.  Recommend continuing medical therapy.  Continue aspirin indefinitely.  2. Hyperlipidemia: Continue treatment with atorvastatin.  Most recent lipid profile in May 2021  showed an LDL of 62 and triglyceride of 55.   3.  Essential hypertension: Blood pressure is reasonably controlled on amlodipine.   Disposition:   FU with me in 6 months  Signed,  Kathlyn Sacramento, MD  03/15/2020 3:09 PM    [facility]

## 2020-03-15 NOTE — Patient Instructions (Signed)

## 2020-04-16 ENCOUNTER — Ambulatory Visit
Admission: RE | Admit: 2020-04-16 | Discharge: 2020-04-16 | Disposition: A | Discharge status: Home / Self Care | Payer: Medicare Other | Attending: Urology | Admitting: Urology

## 2020-04-16 ENCOUNTER — Ambulatory Visit
Admission: RE | Admit: 2020-04-16 | Discharge: 2020-04-16 | Disposition: A | Discharge status: Home / Self Care | Payer: Medicare Other | Source: Ambulatory Visit | Attending: Urology | Admitting: Urology

## 2020-04-16 ENCOUNTER — Other Ambulatory Visit: Payer: Self-pay | Admitting: Urology

## 2020-04-16 ENCOUNTER — Other Ambulatory Visit: Payer: Self-pay

## 2020-04-16 ENCOUNTER — Ambulatory Visit: Payer: Self-pay | Admitting: Urology

## 2020-04-16 ENCOUNTER — Other Ambulatory Visit: Payer: Self-pay | Admitting: Family Medicine

## 2020-04-16 DIAGNOSIS — N200 Calculus of kidney: Secondary | ICD-10-CM

## 2020-04-18 ENCOUNTER — Ambulatory Visit (INDEPENDENT_AMBULATORY_CARE_PROVIDER_SITE_OTHER): Payer: Medicare Other | Admitting: Urology

## 2020-04-18 ENCOUNTER — Other Ambulatory Visit: Payer: Self-pay

## 2020-04-18 ENCOUNTER — Encounter: Payer: Self-pay | Admitting: Urology

## 2020-04-18 VITALS — BP 114/70 | HR 98 | Ht 66.0 in | Wt 124.0 lb

## 2020-04-18 DIAGNOSIS — N200 Calculus of kidney: Secondary | ICD-10-CM

## 2020-04-18 NOTE — Progress Notes (Signed)
04/18/2020 1:18 PM   Karen Liu 03/14/1930 [id number]  Referring provider: Baxter Hire, MD [place],  [place] [address]  Chief Complaint  Patient presents with  . Nephrolithiasis    HPI: 87 y.o. female presents for annual follow-up.   History uric acid stones on Urocit-K  Last stone 08/2018  Over the past year she states she has passed 2-3 small calculi without problems  Prior imaging did show small, bilateral renal calculi  Remains on potassium citrate   PMH: Past Medical History:  Diagnosis Date  . Arthritis    neck  . Brain tumor ([place])   . CAD (coronary artery disease)    a. LHC 7/16: 50% mLAD not sig by FFR (0.9), mild LCx/RCA dz,EF & LVEDP nl; b. LHC 7/19: mLAD 40%, mLCx 30%, ostRCA 30%, mRCA 99% (3.0x26 Resolute DES), nl LVSF, mildly elevated LVEDP.  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 Essential hypertension   . GERD (gastroesophageal reflux disease)   . History of CVA (cerebrovascular accident) 01/02/2011   Overview:  Left  . History of kidney stones   . Hyperlipidemia   . Hypertension   . Motion sickness    cars  . PONV (postoperative nausea and vomiting)   . Vertigo    before brain surgery    Surgical History: Past Surgical History:  Procedure Laterality Date  . ABDOMINAL HYSTERECTOMY    . APPENDECTOMY    . BRAIN SURGERY  brain tumor removed 2013  . CARDIAC CATHETERIZATION N/A 09/21/2014   Procedure: Left Heart Cath and Coronary Angiography;  Surgeon: Wellington Hampshire, MD;  Location: [place] [facility];  Service: Cardiovascular;  Laterality: N/A;  . CATARACT EXTRACTION W/ INTRAOCULAR LENS  IMPLANT, BILATERAL    . 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 COLONOSCOPY    . CORONARY STENT INTERVENTION N/A 09/21/2017   Procedure: CORONARY STENT INTERVENTION;  Surgeon: Wellington Hampshire, MD;  Location: [place] [facility];  Service: Cardiovascular;  Laterality: N/A;  .  ESOPHAGOGASTRODUODENOSCOPY    . ESOPHAGOGASTRODUODENOSCOPY (EGD) WITH PROPOFOL N/A 11/13/2014   Procedure: ESOPHAGOGASTRODUODENOSCOPY (EGD) WITH PROPOFOL;  Surgeon: Manya Silvas, MD;  Location: [facility] ENDOSCOPY;  Service: Endoscopy;  Laterality: N/A;  . LEFT HEART CATH Right 09/21/2017   Procedure: Left Heart Cath;  Surgeon: Wellington Hampshire, MD;  Location: [place] [facility];  Service: Cardiovascular;  Laterality: Right;  . MICROLARYNGOSCOPY N/A 11/22/2014   Procedure: MICROLARYNGOSCOPY DIRECT WITH REMOVAL EPIGLOTTIC CYST;  Surgeon: Beverly Gust, MD;  Location: [place];  Service: ENT;  Laterality: N/A;    Home Medications:  Allergies as of 04/18/2020      Reactions   Regadenoson    Severe chest pain, dyspnea and distress (prolonged) Severe chest pain, dyspnea and distress (prolonged)   Other Rash   Other reaction(s): HIVES   Phenazopyridine Rash   Pyridium [phenazopyridine Hcl] Rash   Sulfa Antibiotics Rash   Other reaction(s): HIVES   Sulfacetamide Sodium Rash   Other reaction(s): HIVES   Tamsulosin Nausea And Vomiting      Medication List       Accurate as of April 18, 2020  1:18 PM. If you have any questions, ask your nurse or doctor.        amLODipine 2.5 MG tablet Commonly known as: NORVASC TAKE 1 TABLET BY MOUTH EVERY DAY   aspirin EC 81 MG tablet Take 1 tablet by mouth every morning.  atorvastatin 40 MG tablet Commonly known as: LIPITOR Take 1 tablet (40 mg total) by mouth daily at 6 PM.   calcium-vitamin D 500-200 MG-UNIT tablet Commonly known as: OSCAL WITH D Take 2 tablets by mouth daily with breakfast.   cetirizine 10 MG chewable tablet Commonly known as: ZYRTEC Chew 10 mg by mouth daily as needed.   famotidine 20 MG tablet Commonly known as: PEPCID Take 20 mg by mouth daily.   famotidine 40 MG tablet Commonly known as: PEPCID Take 40 mg by mouth daily.   nitroGLYCERIN 0.4 MG SL tablet Commonly known as: NITROSTAT Place 1  tablet (0.4 mg total) under the tongue every 5 (five) minutes as needed for chest pain.   pantoprazole 40 MG tablet Commonly known as: PROTONIX Take 40 mg by mouth daily.   potassium citrate 10 MEQ (1080 MG) SR tablet Commonly known as: UROCIT-K TAKE 1 TABLET (10 MEQ TOTAL) BY MOUTH 2 (TWO) TIMES DAILY.   vitamin B-12 500 MCG tablet Commonly known as: CYANOCOBALAMIN Take 1,000 mcg by mouth every morning.       Allergies:  Allergies  Allergen Reactions  . Regadenoson     Severe chest pain, dyspnea and distress (prolonged) Severe chest pain, dyspnea and distress (prolonged)  . Other Rash    Other reaction(s): HIVES   . Phenazopyridine Rash  . Pyridium [Phenazopyridine Hcl] Rash  . Sulfa Antibiotics Rash    Other reaction(s): HIVES  . Sulfacetamide Sodium Rash    Other reaction(s): HIVES  . Tamsulosin Nausea And Vomiting    Family History: Family History  Problem Relation Age of Onset  . Heart attack Mother 37  . Heart disease Mother   . Hypertension Mother   . Hyperlipidemia Mother   . Breast cancer Neg Hx     Social History:  reports that she has never smoked. She has never used smokeless tobacco. She reports that she does not drink alcohol and does not use drugs.   Physical Exam: BP 114/70   Pulse 98   Ht 5\' 6"  (1.676 m)   Wt 124 lb (56.2 kg)   BMI 20.01 kg/m   Constitutional:  Alert and oriented, No acute distress. HEENT: [place] AT, moist mucus membranes.  Trachea midline, no masses. Cardiovascular: No clubbing, cyanosis, or edema. Respiratory: Normal respiratory effort, no increased work of breathing. GI: Abdomen is soft, nontender, nondistended, no abdominal masses    Pertinent Imaging: KUB from 04/16/2020 was personally reviewed and interpreted.  There is a moderate amount of stool and bowel gas overlying the renal outlines bilaterally.  No definite calcifications suspicious for urinary tract stones are identified  Assessment & Plan:    1.  Bilateral  nephrolithiasis  No definite calcifications seen on recent KUB however there is a moderate mental stool and bowel gas obscuring the renal outlines  Follow-up 1 year with KUB; with history uric acid calculi consider renal ultrasound 1 year  Instructed call earlier for flank pain or renal colic   Abbie Sons, MD  [facility] [address], [place], [place] [address] [phone]

## 2020-05-29 ENCOUNTER — Other Ambulatory Visit: Payer: Self-pay | Admitting: Internal Medicine

## 2020-05-29 DIAGNOSIS — Z1231 Encounter for screening mammogram for malignant neoplasm of breast: Secondary | ICD-10-CM

## 2020-06-15 ENCOUNTER — Ambulatory Visit
Admission: RE | Admit: 2020-06-15 | Discharge: 2020-06-15 | Disposition: A | Discharge status: Home / Self Care | Payer: Medicare Other | Source: Ambulatory Visit | Attending: Internal Medicine | Admitting: Internal Medicine

## 2020-06-15 ENCOUNTER — Other Ambulatory Visit: Payer: Self-pay

## 2020-06-15 DIAGNOSIS — Z1231 Encounter for screening mammogram for malignant neoplasm of breast: Secondary | ICD-10-CM | POA: Insufficient documentation

## 2020-06-25 ENCOUNTER — Other Ambulatory Visit: Payer: Self-pay | Admitting: Internal Medicine

## 2020-06-25 DIAGNOSIS — R928 Other abnormal and inconclusive findings on diagnostic imaging of breast: Secondary | ICD-10-CM

## 2020-06-29 ENCOUNTER — Ambulatory Visit
Admission: RE | Admit: 2020-06-29 | Discharge: 2020-06-29 | Disposition: A | Discharge status: Home / Self Care | Payer: Medicare Other | Source: Ambulatory Visit | Attending: Internal Medicine | Admitting: Internal Medicine

## 2020-06-29 ENCOUNTER — Other Ambulatory Visit: Payer: Self-pay

## 2020-06-29 DIAGNOSIS — R928 Other abnormal and inconclusive findings on diagnostic imaging of breast: Secondary | ICD-10-CM | POA: Insufficient documentation

## 2020-08-09 ENCOUNTER — Other Ambulatory Visit: Payer: Self-pay | Admitting: Cardiovascular Disease

## 2020-09-06 ENCOUNTER — Other Ambulatory Visit: Payer: Self-pay | Admitting: Unknown Physician Specialty

## 2020-09-06 ENCOUNTER — Other Ambulatory Visit (HOSPITAL_COMMUNITY): Payer: Self-pay | Admitting: Unknown Physician Specialty

## 2020-09-06 DIAGNOSIS — H9121 Sudden idiopathic hearing loss, right ear: Secondary | ICD-10-CM

## 2020-09-17 ENCOUNTER — Ambulatory Visit
Admission: RE | Admit: 2020-09-17 | Discharge: 2020-09-17 | Disposition: A | Discharge status: Home / Self Care | Payer: Medicare Other | Source: Ambulatory Visit | Attending: Unknown Physician Specialty | Admitting: Unknown Physician Specialty

## 2020-09-17 ENCOUNTER — Other Ambulatory Visit: Payer: Self-pay

## 2020-09-17 DIAGNOSIS — H9121 Sudden idiopathic hearing loss, right ear: Secondary | ICD-10-CM

## 2020-09-17 MED ORDER — GADOBUTROL 1 MMOL/ML IV SOLN
5.0000 mL | Freq: Once | INTRAVENOUS | Status: AC | PRN
  Administered 2020-09-17: 5 mL via INTRAVENOUS

## 2020-09-20 ENCOUNTER — Ambulatory Visit (INDEPENDENT_AMBULATORY_CARE_PROVIDER_SITE_OTHER): Payer: Medicare Other | Admitting: Cardiovascular Disease

## 2020-09-20 ENCOUNTER — Other Ambulatory Visit: Payer: Self-pay

## 2020-09-20 ENCOUNTER — Encounter: Payer: Self-pay | Admitting: Cardiovascular Disease

## 2020-09-20 VITALS — BP 118/58 | HR 80 | Ht 66.0 in | Wt 124.5 lb

## 2020-09-20 DIAGNOSIS — I2510 Atherosclerotic heart disease of native coronary artery without angina pectoris: Secondary | ICD-10-CM

## 2020-09-20 DIAGNOSIS — I10 Essential (primary) hypertension: Secondary | ICD-10-CM | POA: Diagnosis not present

## 2020-09-20 DIAGNOSIS — E785 Hyperlipidemia, unspecified: Secondary | ICD-10-CM

## 2020-09-20 MED ORDER — ATORVASTATIN CALCIUM 40 MG PO TABS: 40.0000 mg | ORAL_TABLET | Freq: Every day | ORAL | 3 refills | Status: DC

## 2020-09-20 NOTE — Progress Notes (Signed)
Cardiology Office Note   Date:  09/20/2020   ID:  Karen Liu, Karen Liu May 29, 1930, MRN [id number]  PCP:  Baxter Hire, MD  Cardiologist:   Kathlyn Sacramento, MD   Chief Complaint  Patient presents with   Other    6 month f/u no complaints today. Meds reviewed verbally with pt.      History of Present Illness: Karen Liu is a 87 y.o. female who presents for a follow-up visit regarding coronary artery disease. 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 She had unstable angina in July 2019. Cardiac catheterization showed 99% stenosis in the mid RCA and stable moderate LAD disease.  The RCA was treated successfully with PCI and drug-eluting stent placement. She had dizziness that was thought to be due to carvedilol which improved after switching back to amlodipine.   She has been doing well overall with no chest pain, shortness of breath or palpitations.  She does complain of mild fatigue.  She has chronic anemia which has been stable.   Past Medical History:  Diagnosis Date   Arthritis    neck   Brain tumor ([place])    CAD (coronary artery disease)    a. LHC 7/16: 50% mLAD not sig by FFR (0.9), mild LCx/RCA dz,EF & LVEDP nl; b. LHC 7/19: mLAD 40%, mLCx 30%, ostRCA 30%, mRCA 99% (3.0x26 Resolute DES), nl LVSF, mildly elevated LVEDP.   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   LEFT   Essential hypertension    GERD (gastroesophageal reflux disease)    History of CVA (cerebrovascular accident) 01/02/2011   Overview:  Left   History of kidney stones    Hyperlipidemia    Hypertension    Motion sickness    cars   PONV (postoperative nausea and vomiting)    Vertigo    before brain surgery    Past Surgical History:  Procedure Laterality Date   ABDOMINAL HYSTERECTOMY     APPENDECTOMY     BRAIN SURGERY  brain tumor removed 2013   CARDIAC CATHETERIZATION N/A 09/21/2014   Procedure: Left Heart Cath and Coronary  Angiography;  Surgeon: Wellington Hampshire, MD;  Location: [place] [facility];  Service: Cardiovascular;  Laterality: N/A;   CATARACT EXTRACTION W/ INTRAOCULAR LENS  IMPLANT, BILATERAL     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   RESECTION CEREBELLAR MENINGIOMA   COLONOSCOPY     CORONARY STENT INTERVENTION N/A 09/21/2017   Procedure: CORONARY STENT INTERVENTION;  Surgeon: Wellington Hampshire, MD;  Location: [place] [facility];  Service: Cardiovascular;  Laterality: N/A;   ESOPHAGOGASTRODUODENOSCOPY     ESOPHAGOGASTRODUODENOSCOPY (EGD) WITH PROPOFOL N/A 11/13/2014   Procedure: ESOPHAGOGASTRODUODENOSCOPY (EGD) WITH PROPOFOL;  Surgeon: Manya Silvas, MD;  Location: [facility] ENDOSCOPY;  Service: Endoscopy;  Laterality: N/A;   LEFT HEART CATH Right 09/21/2017   Procedure: Left Heart Cath;  Surgeon: Wellington Hampshire, MD;  Location: [place] [facility];  Service: Cardiovascular;  Laterality: Right;   MICROLARYNGOSCOPY N/A 11/22/2014   Procedure: MICROLARYNGOSCOPY DIRECT WITH REMOVAL EPIGLOTTIC CYST;  Surgeon: Beverly Gust, MD;  Location: [place];  Service: ENT;  Laterality: N/A;     Current Outpatient Medications  Medication Sig Dispense Refill   amLODipine (NORVASC) 2.5 MG tablet TAKE 1 TABLET BY MOUTH EVERY DAY 90 tablet 3   aspirin EC 81 MG tablet Take 1 tablet by mouth every morning.     atorvastatin (LIPITOR) 40 MG tablet TAKE 1 TABLET (40  MG TOTAL) BY MOUTH DAILY AT 6 PM. 90 tablet 0   calcium-vitamin D (OSCAL WITH D) 500-200 MG-UNIT tablet Take 2 tablets by mouth daily with breakfast.     cetirizine (ZYRTEC) 10 MG chewable tablet Chew 10 mg by mouth daily as needed.      famotidine (PEPCID) 40 MG tablet Take 40 mg by mouth daily.     nitroGLYCERIN (NITROSTAT) 0.4 MG SL tablet Place 1 tablet (0.4 mg total) under the tongue every 5 (five) minutes as needed for chest pain. 25 tablet 3   pantoprazole (PROTONIX) 40 MG tablet Take 40 mg by mouth daily.     potassium citrate (UROCIT-K) 10  MEQ (1080 MG) SR tablet TAKE 1 TABLET (10 MEQ TOTAL) BY MOUTH 2 (TWO) TIMES DAILY. 180 tablet 3   vitamin B-12 (CYANOCOBALAMIN) 500 MCG tablet Take 1,000 mcg by mouth every morning.      No current facility-administered medications for this visit.    Allergies:   Regadenoson, Other, Phenazopyridine, Pyridium [phenazopyridine hcl], Sulfa antibiotics, Sulfacetamide sodium, and Tamsulosin    Social History:  The patient  reports that she has never smoked. She has never used smokeless tobacco. She reports that she does not drink alcohol and does not use drugs.   Family History:  The patient's family history includes Heart attack (age of onset: 51) in her mother; Heart disease in her mother; Hyperlipidemia in her mother; Hypertension in her mother.    ROS:  Please see the history of present illness.   Otherwise, review of systems are positive for none.   All other systems are reviewed and negative.    PHYSICAL EXAM: VS:  BP (!) 118/58 (BP Location: Left Arm, Patient Position: Sitting, Cuff Size: Normal)   Pulse 80   Ht '5\' 6"'$  (1.676 m)   Wt 124 lb 8 oz (56.5 kg)   SpO2 98%   BMI 20.09 kg/m  , BMI Body mass index is 20.09 kg/m. GEN: Well nourished, well developed, in no acute distress  HEENT: normal  Neck: no JVD, carotid bruits, or masses Cardiac: RRR; no murmurs, rubs, or gallops,no edema  Respiratory:  clear to auscultation bilaterally, normal work of breathing GI: soft, nontender, nondistended, + BS MS: no deformity or atrophy  Skin: warm and dry, no rash Neuro:  Strength and sensation are intact Psych: euthymic mood, full affect   EKG:  EKG is ordered today. The ekg ordered today demonstrates normal sinus rhythm with no significant ST or T wave changes.   Recent Labs: No results found for requested labs within last 8760 hours.    Lipid Panel    Component Value Date/Time   CHOL 129 12/23/2017 0850   TRIG 52 12/23/2017 0850   HDL 56 12/23/2017 0850   CHOLHDL 2.3  12/23/2017 0850   LDLCALC 63 12/23/2017 0850   LDLDIRECT 80 11/11/2017 1156      Wt Readings from Last 3 Encounters:  09/20/20 124 lb 8 oz (56.5 kg)  04/18/20 124 lb (56.2 kg)  03/15/20 123 lb (55.8 kg)        ASSESSMENT AND PLAN:  1.  Coronary artery disease involving native coronary artery without angina: She is doing very well with no anginal symptoms.  Recommend continuing medical therapy.  Continue aspirin indefinitely.  2. Hyperlipidemia: I reviewed most recent lipid profile which was done in May and showed an LDL of 69 which is at target.  I refilled atorvastatin.  3.  Essential hypertension: Blood pressure is well controlled  on amlodipine.   Disposition:   FU with me in 6 months  Signed,  Kathlyn Sacramento, MD  09/20/2020 9:30 AM    [place]

## 2020-09-20 NOTE — Patient Instructions (Signed)
Medication Instructions:  Your physician recommends that you continue on your current medications as directed. Please refer to the Current Medication list given to you today.  *If you need a refill on your cardiac medications before your next appointment, please call your pharmacy*   Lab Work: None ordered If you have labs (blood work) drawn today and your tests are completely normal, you will receive your results only by: MyChart Message (if you have MyChart) OR A paper copy in the mail If you have any lab test that is abnormal or we need to change your treatment, we will call you to review the results.   Testing/Procedures: None ordered   Follow-Up: At [facility], you and your health needs are our priority.  As part of our continuing mission to provide you with exceptional heart care, we have created designated Provider Care Teams.  These Care Teams include your primary Cardiologist (physician) and Advanced Practice Providers (APPs -  Physician Assistants and Nurse Practitioners) who all work together to provide you with the care you need, when you need it.  We recommend signing up for the patient portal called "MyChart".  Sign up information is provided on this After Visit Summary.  MyChart is used to connect with patients for Virtual Visits (Telemedicine).  Patients are able to view lab/test results, encounter notes, upcoming appointments, etc.  Non-urgent messages can be sent to your provider as well.   To learn more about what you can do with MyChart, go to [email or web address]://www.mychart.com.    Your next appointment:   Your physician wants you to follow-up in: 6 months You will receive a reminder letter in the mail two months in advance. If you don't receive a letter, please call our office to schedule the follow-up appointment.   The format for your next appointment:   In Person  Provider:   You may see Muhammad Arida, MD or one of the following Advanced Practice Providers on your  designated Care Team:   Christopher Berge, NP Ryan Dunn, PA-C Jacquelyn Visser, PA-C Cadence Furth, PA-C   Other Instructions N/A  

## 2021-02-02 ENCOUNTER — Other Ambulatory Visit: Payer: Self-pay | Admitting: Cardiovascular Disease

## 2021-04-08 ENCOUNTER — Telehealth: Payer: Self-pay | Admitting: Cardiovascular Disease

## 2021-04-08 NOTE — Telephone Encounter (Signed)
°*  STAT* If patient is at the pharmacy, call can be transferred to refill team.   1. Which medications need to be refilled? (please list name of each medication and dose if known) Norva [place] 2.5mg , 1 tablet daily  2. Which pharmacy/location (including street and city if local pharmacy) is medication to be sent to? [facility]  3. Do they need a 30 day or 90 day supply? 90 day

## 2021-04-09 ENCOUNTER — Other Ambulatory Visit: Payer: Self-pay

## 2021-04-09 ENCOUNTER — Ambulatory Visit (INDEPENDENT_AMBULATORY_CARE_PROVIDER_SITE_OTHER): Payer: Medicare Other | Admitting: Cardiovascular Disease

## 2021-04-09 ENCOUNTER — Encounter: Payer: Self-pay | Admitting: Cardiovascular Disease

## 2021-04-09 VITALS — BP 138/60 | HR 80 | Ht 66.0 in | Wt 116.4 lb

## 2021-04-09 DIAGNOSIS — E785 Hyperlipidemia, unspecified: Secondary | ICD-10-CM | POA: Diagnosis not present

## 2021-04-09 DIAGNOSIS — I10 Essential (primary) hypertension: Secondary | ICD-10-CM

## 2021-04-09 DIAGNOSIS — I2510 Atherosclerotic heart disease of native coronary artery without angina pectoris: Secondary | ICD-10-CM

## 2021-04-09 MED ORDER — AMLODIPINE BESYLATE 2.5 MG PO TABS: 2.5000 mg | ORAL_TABLET | Freq: Every day | ORAL | 2 refills | Status: DC

## 2021-04-09 NOTE — Patient Instructions (Signed)

## 2021-04-09 NOTE — Progress Notes (Signed)
Cardiology Office Note   Date:  04/09/2021   ID:  [name], [name] [birth year], MRN [id number]  PCP:  Baxter Hire, MD  Cardiologist:   Kathlyn Sacramento, MD   Chief Complaint  Patient presents with   Other    6 Month f/u no complaints today. Meds reviewed verbally with pt.      History of Present Illness: Karen Liu is a 88 y.o. female who presents for a follow-up visit regarding coronary artery disease. 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 She had unstable angina in July 2019. Cardiac catheterization showed 99% stenosis in the mid RCA and stable moderate LAD disease.  The RCA was treated successfully with PCI and drug-eluting stent placement. She had dizziness that was thought to be due to carvedilol which improved after switching back to amlodipine.   She has been doing very well with no chest pain, shortness of breath or palpitations.  She takes her medications regularly.  She continues to be independent and is able to drive.   Past Medical History:  Diagnosis Date   Arthritis    neck   Brain tumor ([place])    CAD (coronary artery disease)    a. LHC 7/16: 50% mLAD not sig by FFR (0.9), mild LCx/RCA dz,EF & LVEDP nl; b. LHC 7/19: mLAD 40%, mLCx 30%, ostRCA 30%, mRCA 99% (3.0x26 Resolute DES), nl LVSF, mildly elevated LVEDP.   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   LEFT   Essential hypertension    GERD (gastroesophageal reflux disease)    History of CVA (cerebrovascular accident) 01/02/2011   Overview:  Left   History of kidney stones    Hyperlipidemia    Hypertension    Motion sickness    cars   PONV (postoperative nausea and vomiting)    Vertigo    before brain surgery    Past Surgical History:  Procedure Laterality Date   ABDOMINAL HYSTERECTOMY     APPENDECTOMY     BRAIN SURGERY  brain tumor removed 2013   CARDIAC CATHETERIZATION N/A 09/21/2014   Procedure: Left Heart Cath and Coronary  Angiography;  Surgeon: Wellington Hampshire, MD;  Location: [facility];  Service: Cardiovascular;  Laterality: N/A;   CATARACT EXTRACTION W/ INTRAOCULAR LENS  IMPLANT, BILATERAL     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   RESECTION CEREBELLAR MENINGIOMA   COLONOSCOPY     CORONARY STENT INTERVENTION N/A 09/21/2017   Procedure: CORONARY STENT INTERVENTION;  Surgeon: Wellington Hampshire, MD;  Location: [facility];  Service: Cardiovascular;  Laterality: N/A;   ESOPHAGOGASTRODUODENOSCOPY     ESOPHAGOGASTRODUODENOSCOPY (EGD) WITH PROPOFOL N/A 11/13/2014   Procedure: ESOPHAGOGASTRODUODENOSCOPY (EGD) WITH PROPOFOL;  Surgeon: Manya Silvas, MD;  Location: [facility];  Service: Endoscopy;  Laterality: N/A;   LEFT HEART CATH Right 09/21/2017   Procedure: Left Heart Cath;  Surgeon: Wellington Hampshire, MD;  Location: [facility];  Service: Cardiovascular;  Laterality: Right;   MICROLARYNGOSCOPY N/A 11/22/2014   Procedure: MICROLARYNGOSCOPY DIRECT WITH REMOVAL EPIGLOTTIC CYST;  Surgeon: Beverly Gust, MD;  Location: [facility];  Service: ENT;  Laterality: N/A;     Current Outpatient Medications  Medication Sig Dispense Refill   aspirin EC 81 MG tablet Take 1 tablet by mouth every morning.     atorvastatin (LIPITOR) 40 MG tablet Take 1 tablet (40 mg total) by mouth daily at 6 PM. 90 tablet 3   calcium-vitamin D (OSCAL  WITH D) 500-200 MG-UNIT tablet Take 2 tablets by mouth daily with breakfast.     cetirizine (ZYRTEC) 10 MG chewable tablet Chew 10 mg by mouth daily as needed.      famotidine (PEPCID) 40 MG tablet Take 40 mg by mouth daily.     nitroGLYCERIN (NITROSTAT) 0.4 MG SL tablet Place 1 tablet (0.4 mg total) under the tongue every 5 (five) minutes as needed for chest pain. 25 tablet 3   pantoprazole (PROTONIX) 40 MG tablet Take 40 mg by mouth daily.     potassium citrate (UROCIT-K) 10 MEQ (1080 MG) SR tablet TAKE 1 TABLET (10 MEQ TOTAL) BY MOUTH 2 (TWO) TIMES DAILY.  180 tablet 3   vitamin B-12 (CYANOCOBALAMIN) 500 MCG tablet Take 1,000 mcg by mouth every morning.      amLODipine (NORVASC) 2.5 MG tablet Take 1 tablet (2.5 mg total) by mouth daily. 90 tablet 2   No current facility-administered medications for this visit.    Allergies:   Regadenoson, Other, Phenazopyridine, Pyridium [phenazopyridine hcl], Sulfa antibiotics, Sulfacetamide sodium, and Tamsulosin    Social History:  The patient  reports that she has never smoked. She has never used smokeless tobacco. She reports that she does not drink alcohol and does not use drugs.   Family History:  The patient's family history includes Heart attack (age of onset: 59) in her mother; Heart disease in her mother; Hyperlipidemia in her mother; Hypertension in her mother.    ROS:  Please see the history of present illness.   Otherwise, review of systems are positive for none.   All other systems are reviewed and negative.    PHYSICAL EXAM: VS:  BP 138/60 (BP Location: Left Arm, Patient Position: Sitting, Cuff Size: Normal)    Pulse 80    Ht 5\' 6"  (1.676 m)    Wt 116 lb 6 oz (52.8 kg)    SpO2 96%    BMI 18.78 kg/m  , BMI Body mass index is 18.78 kg/m. GEN: Well nourished, well developed, in no acute distress  HEENT: normal  Neck: no JVD, carotid bruits, or masses Cardiac: RRR; no rubs, or gallops,no edema . 1/6 SEM in aortic area.  Respiratory:  clear to auscultation bilaterally, normal work of breathing GI: soft, nontender, nondistended, + BS MS: no deformity or atrophy  Skin: warm and dry, no rash Neuro:  Strength and sensation are intact Psych: euthymic mood, full affect   EKG:  EKG is ordered today. The ekg ordered today demonstrates normal sinus rhythm with no significant ST or T wave changes.   Recent Labs: No results found for requested labs within last 8760 hours.    Lipid Panel    Component Value Date/Time   CHOL 129 12/23/2017 0850   TRIG 52 12/23/2017 0850   HDL 56 12/23/2017  0850   CHOLHDL 2.3 12/23/2017 0850   LDLCALC 63 12/23/2017 0850   LDLDIRECT 80 11/11/2017 1156      Wt Readings from Last 3 Encounters:  04/09/21 116 lb 6 oz (52.8 kg)  09/20/20 124 lb 8 oz (56.5 kg)  04/18/20 124 lb (56.2 kg)        ASSESSMENT AND PLAN:  1.  Coronary artery disease involving native coronary artery without angina: She is doing very well with no anginal symptoms.  Recommend continuing medical therapy.  Continue aspirin indefinitely.  2. Hyperlipidemia: I reviewed most recent lipid profile done in May with her primary care physician which showed an LDL of 69.  Continue atorvastatin 40 mg once daily.  3.  Essential hypertension: Blood pressure is well controlled on amlodipine which was refilled today.   Disposition:   FU with me in 6 months  Signed,  Kathlyn Sacramento, MD  04/09/2021 4:29 PM    [place]

## 2021-04-15 ENCOUNTER — Other Ambulatory Visit: Payer: Self-pay | Admitting: *Deleted

## 2021-04-15 DIAGNOSIS — N200 Calculus of kidney: Secondary | ICD-10-CM

## 2021-04-17 ENCOUNTER — Ambulatory Visit
Admission: RE | Admit: 2021-04-17 | Discharge: 2021-04-17 | Disposition: A | Discharge status: Home / Self Care | Payer: Medicare Other | Source: Ambulatory Visit | Attending: Urology | Admitting: Urology

## 2021-04-17 DIAGNOSIS — N200 Calculus of kidney: Secondary | ICD-10-CM | POA: Insufficient documentation

## 2021-04-18 ENCOUNTER — Other Ambulatory Visit: Payer: Self-pay

## 2021-04-18 ENCOUNTER — Ambulatory Visit (INDEPENDENT_AMBULATORY_CARE_PROVIDER_SITE_OTHER): Payer: Medicare Other | Admitting: Urology

## 2021-04-18 ENCOUNTER — Encounter: Payer: Self-pay | Admitting: Urology

## 2021-04-18 DIAGNOSIS — N200 Calculus of kidney: Secondary | ICD-10-CM | POA: Diagnosis not present

## 2021-04-18 MED ORDER — POTASSIUM CITRATE ER 10 MEQ (1080 MG) PO TBCR: 10.0000 meq | EXTENDED_RELEASE_TABLET | Freq: Two times a day (BID) | ORAL | 3 refills | Status: AC

## 2021-04-18 NOTE — Progress Notes (Signed)
04/18/2021 3:30 PM   Karen Liu 06-25-1930 [id number]  Referring provider: Baxter Hire, MD [place],  [place] [id number]  Chief Complaint  Patient presents with   Nephrolithiasis    HPI: 88 y.o. female presents for annual follow-up.  History uric acid stones on Urocit-K Last stone 08/2018 Over the past year she states she has passed minute particles on occasion Prior imaging did show small, bilateral renal calculi Remains on potassium citrate   PMH: Past Medical History:  Diagnosis Date   Arthritis    neck   Brain tumor ([place])    CAD (coronary artery disease)    a. LHC 7/16: 50% mLAD not sig by FFR (0.9), mild LCx/RCA dz,EF & LVEDP nl; b. LHC 7/19: mLAD 40%, mLCx 30%, ostRCA 30%, mRCA 99% (3.0x26 Resolute DES), nl LVSF, mildly elevated LVEDP.   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   LEFT   Essential hypertension    GERD (gastroesophageal reflux disease)    History of CVA (cerebrovascular accident) 01/02/2011   Overview:  Left   History of kidney stones    Hyperlipidemia    Hypertension    Motion sickness    cars   PONV (postoperative nausea and vomiting)    Vertigo    before brain surgery    Surgical History: Past Surgical History:  Procedure Laterality Date   ABDOMINAL HYSTERECTOMY     APPENDECTOMY     BRAIN SURGERY  brain tumor removed 2013   CARDIAC CATHETERIZATION N/A 09/21/2014   Procedure: Left Heart Cath and Coronary Angiography;  Surgeon: Wellington Hampshire, MD;  Location: [facility];  Service: Cardiovascular;  Laterality: N/A;   CATARACT EXTRACTION W/ INTRAOCULAR LENS  IMPLANT, BILATERAL     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   RESECTION CEREBELLAR MENINGIOMA   COLONOSCOPY     CORONARY STENT INTERVENTION N/A 09/21/2017   Procedure: CORONARY STENT INTERVENTION;  Surgeon: Wellington Hampshire, MD;  Location: [facility];  Service: Cardiovascular;  Laterality: N/A;   ESOPHAGOGASTRODUODENOSCOPY      ESOPHAGOGASTRODUODENOSCOPY (EGD) WITH PROPOFOL N/A 11/13/2014   Procedure: ESOPHAGOGASTRODUODENOSCOPY (EGD) WITH PROPOFOL;  Surgeon: Manya Silvas, MD;  Location: [facility] ENDOSCOPY;  Service: Endoscopy;  Laterality: N/A;   LEFT HEART CATH Right 09/21/2017   Procedure: Left Heart Cath;  Surgeon: Wellington Hampshire, MD;  Location: [facility];  Service: Cardiovascular;  Laterality: Right;   MICROLARYNGOSCOPY N/A 11/22/2014   Procedure: MICROLARYNGOSCOPY DIRECT WITH REMOVAL EPIGLOTTIC CYST;  Surgeon: Beverly Gust, MD;  Location: [place] [facility];  Service: ENT;  Laterality: N/A;    Home Medications:  Allergies as of 04/18/2021       Reactions   Regadenoson    Severe chest pain, dyspnea and distress (prolonged) Severe chest pain, dyspnea and distress (prolonged)   Other Rash   Other reaction(s): HIVES   Phenazopyridine Rash   Pyridium [phenazopyridine Hcl] Rash   Sulfa Antibiotics Rash   Other reaction(s): HIVES   Sulfacetamide Sodium Rash   Other reaction(s): HIVES   Tamsulosin Nausea And Vomiting        Medication List        Accurate as of April 18, 2021  3:30 PM. If you have any questions, ask your nurse or doctor.          amLODipine 2.5 MG tablet Commonly known as: NORVASC Take 1 tablet (2.5 mg total) by mouth daily.   aspirin EC 81 MG tablet Take 1 tablet by mouth every  morning.   atorvastatin 40 MG tablet Commonly known as: LIPITOR Take 1 tablet (40 mg total) by mouth daily at 6 PM.   calcium-vitamin D 500-200 MG-UNIT tablet Commonly known as: OSCAL WITH D Take 2 tablets by mouth daily with breakfast.   cetirizine 10 MG chewable tablet Commonly known as: ZYRTEC Chew 10 mg by mouth daily as needed.   famotidine 40 MG tablet Commonly known as: PEPCID Take 40 mg by mouth daily.   nitroGLYCERIN 0.4 MG SL tablet Commonly known as: NITROSTAT Place 1 tablet (0.4 mg total) under the tongue every 5 (five) minutes as needed for chest pain.    pantoprazole 40 MG tablet Commonly known as: PROTONIX Take 40 mg by mouth daily.   potassium citrate 10 MEQ (1080 MG) SR tablet Commonly known as: UROCIT-K Take 1 tablet (10 mEq total) by mouth 2 (two) times daily.   vitamin B-12 500 MCG tablet Commonly known as: CYANOCOBALAMIN Take 1,000 mcg by mouth every morning.        Allergies:  Allergies  Allergen Reactions   Regadenoson     Severe chest pain, dyspnea and distress (prolonged) Severe chest pain, dyspnea and distress (prolonged)   Other Rash    Other reaction(s): HIVES    Phenazopyridine Rash   Pyridium [Phenazopyridine Hcl] Rash   Sulfa Antibiotics Rash    Other reaction(s): HIVES   Sulfacetamide Sodium Rash    Other reaction(s): HIVES   Tamsulosin Nausea And Vomiting    Family History: Family History  Problem Relation Age of Onset   Heart attack Mother 31   Heart disease Mother    Hypertension Mother    Hyperlipidemia Mother    Breast cancer Neg Hx     Social History:  reports that she has never smoked. She has never used smokeless tobacco. She reports that she does not drink alcohol and does not use drugs.   Physical Exam: See rooming tab for vitals Constitutional:  Alert and oriented, No acute distress. HEENT: [place] AT, moist mucus membranes.  Trachea midline, no masses. Cardiovascular: No clubbing, cyanosis, or edema. Respiratory: Normal respiratory effort, no increased work of breathing. GI: Abdomen is soft, nontender, nondistended, no abdominal masses    Pertinent Imaging: KUB from 04/17/21 was personally reviewed and interpreted.  There is a moderate amount of stool and bowel gas overlying the renal outlines bilaterally.  Small calcification overlying the right upper pole of the renal shadow as well as minute left calcifications  Assessment & Plan:    1.  Bilateral nephrolithiasis Stable Potassium citrate refilled 1 year follow-up with KUB; instructed to call for development of flank  pain/renal colic   Abbie Sons, MD  [place] [address] [place], [place] [address] [phone]

## 2021-06-27 ENCOUNTER — Ambulatory Visit: Payer: Medicare Other | Admitting: Cardiovascular Disease

## 2021-07-28 IMAGING — MG DIGITAL SCREENING BILAT W/ TOMO W/ CAD
6 of 10 series · 6 of 30 positions shown · non-contrast
Comparison: Previous exam(s).

CLINICAL DATA: Screening.

EXAM:
DIGITAL SCREENING BILATERAL MAMMOGRAM WITH TOMO AND CAD

[L MLO synth-2D]
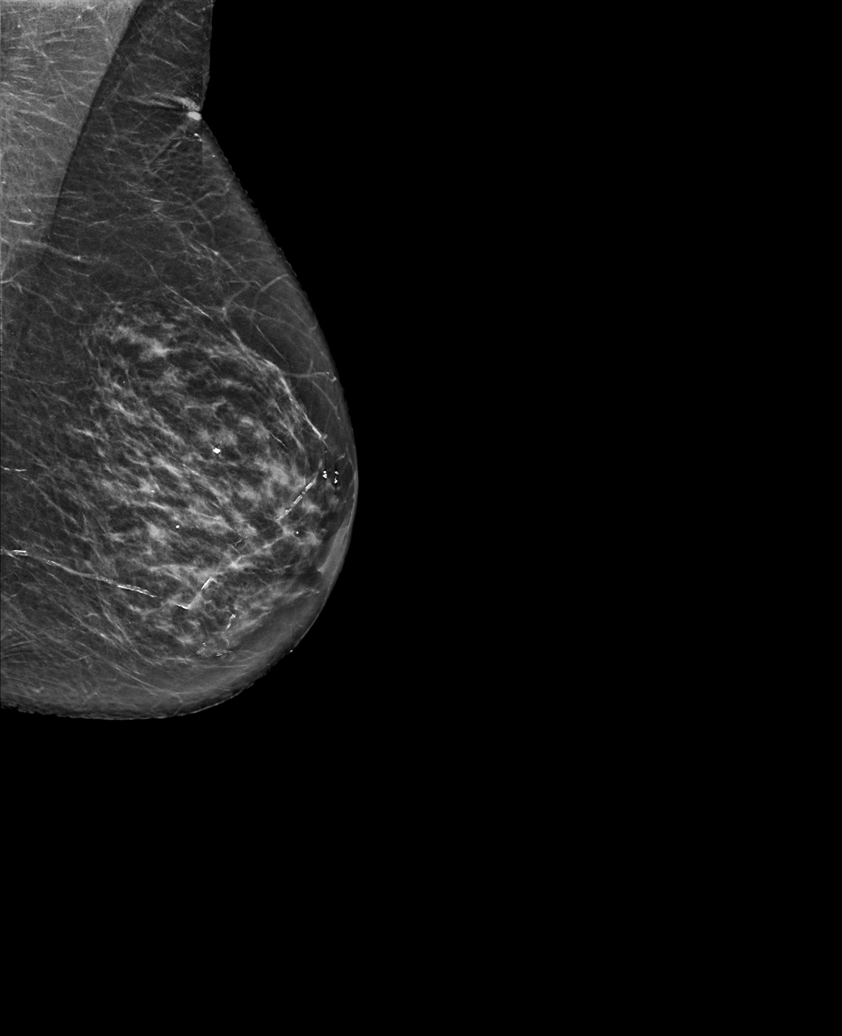

[R MLO synth-2D]
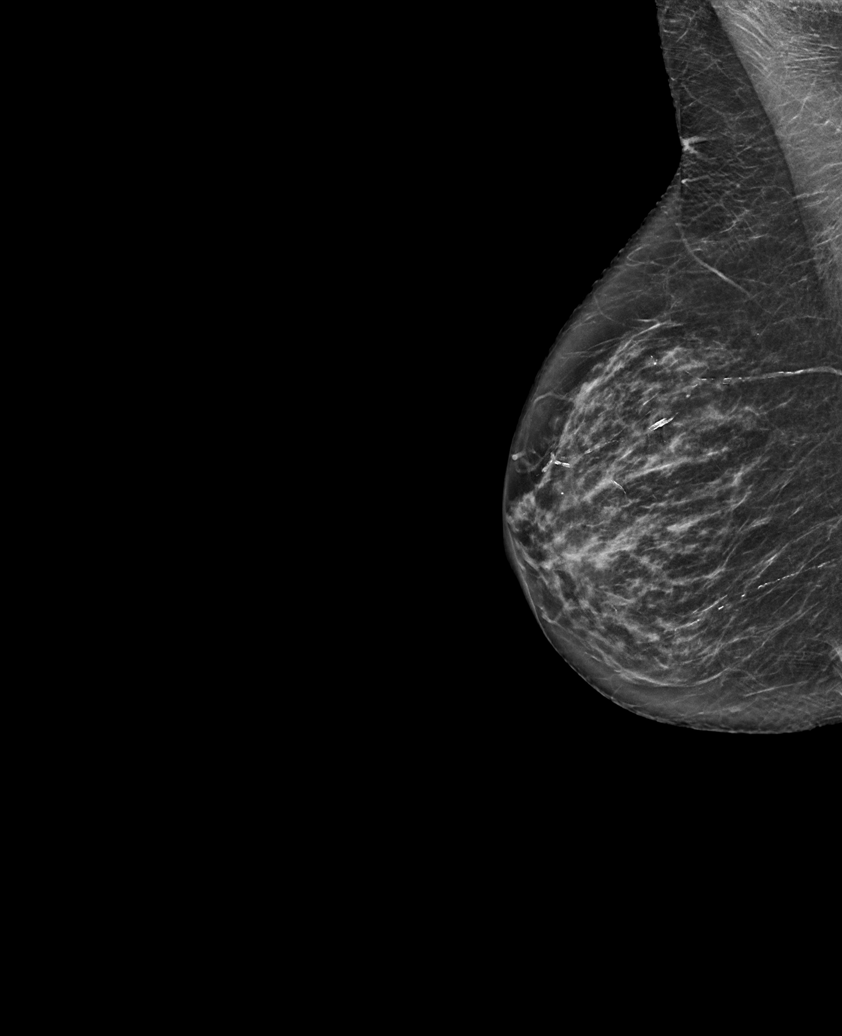

[R XCCL synth-2D]
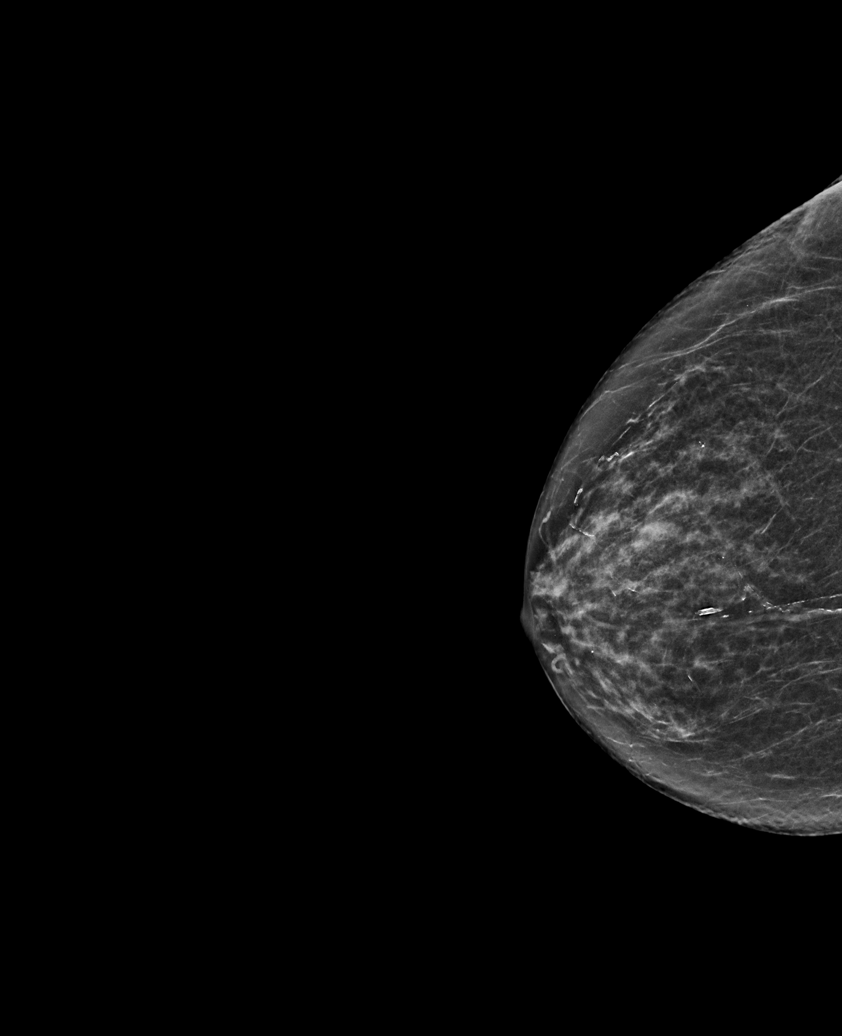

[R CC synth-2D]
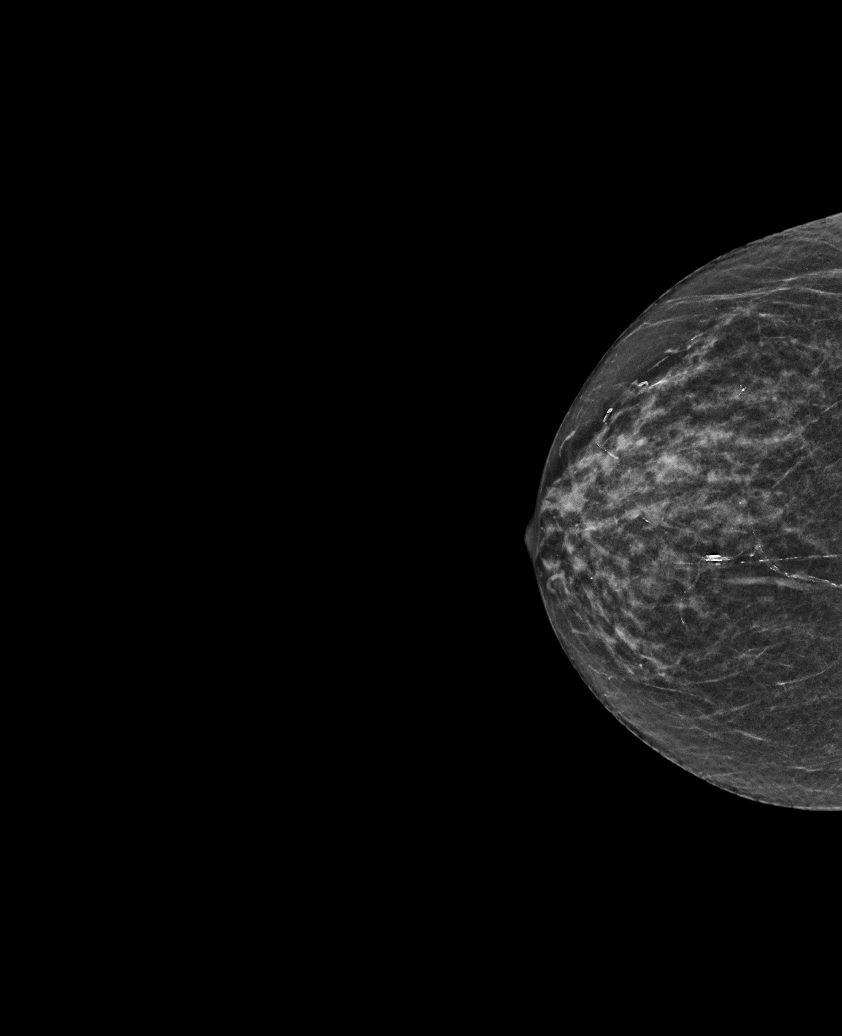

[L CC synth-2D]
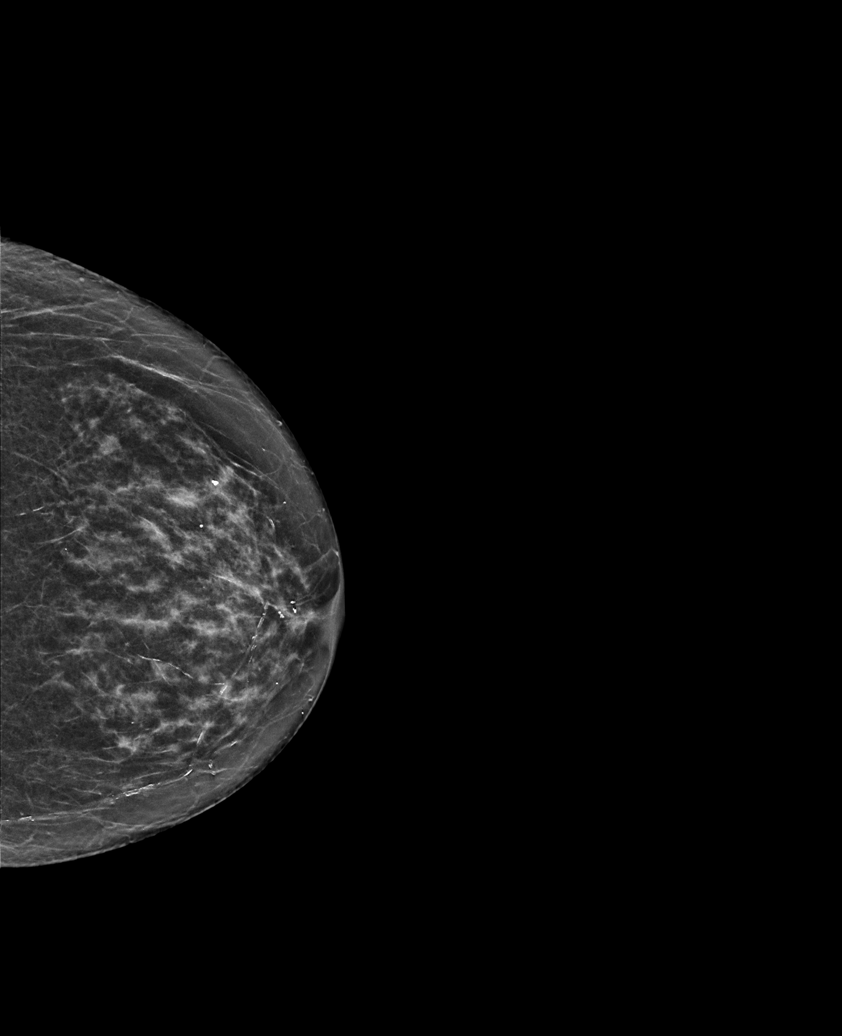

[R CC tomo · tomo slice 23/46.0]
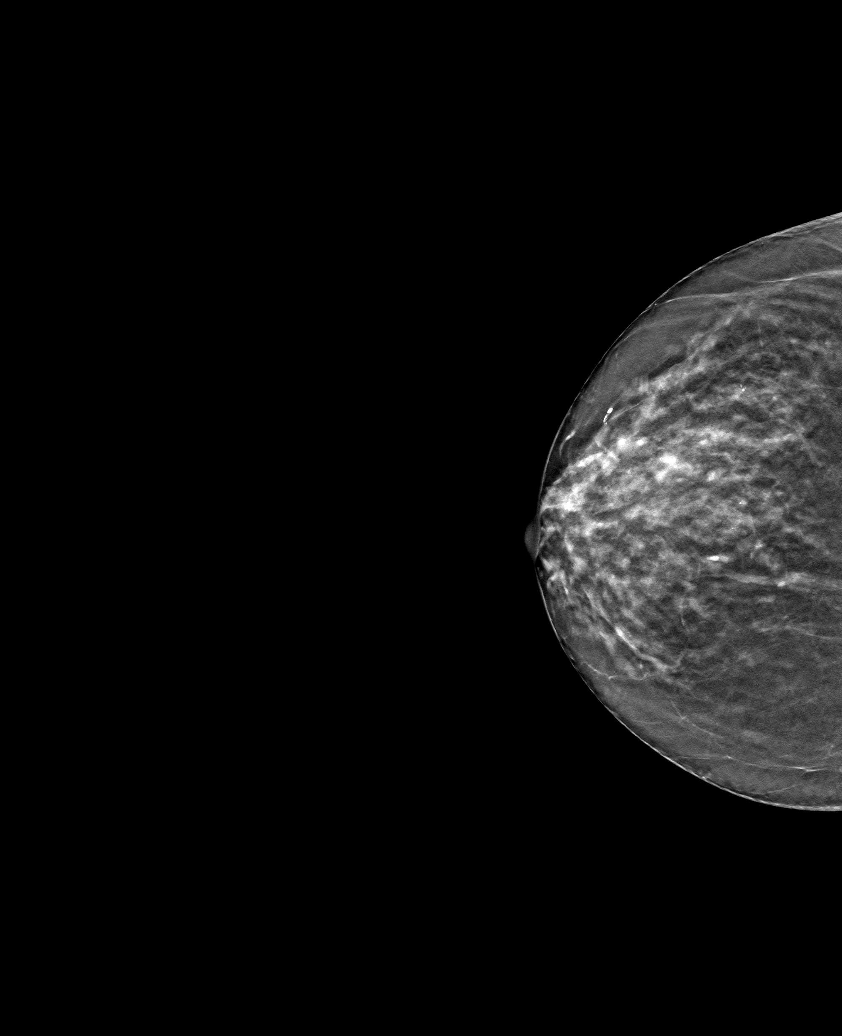

[6 of 30 positions shown; findings below may reference images not displayed]

ACR Breast Density Category c: The breast tissue is heterogeneously
dense, which may obscure small masses.
FINDINGS: There are no findings suspicious for malignancy. Images were
processed with CAD.
IMPRESSION: No mammographic evidence of malignancy. A result letter of this
screening mammogram will be mailed directly to the patient.

RECOMMENDATION:
Screening mammogram in one year. (Code:[id number])

BI-RADS CATEGORY  1: Negative.

## 2021-10-19 ENCOUNTER — Emergency Department: Payer: Medicare Other

## 2021-10-19 ENCOUNTER — Observation Stay
Admission: EM | Admit: 2021-10-19 | Discharge: 2021-10-20 | Disposition: A | Discharge status: Home / Self Care | Payer: Medicare Other | Attending: Internal Medicine | Admitting: Internal Medicine

## 2021-10-19 ENCOUNTER — Other Ambulatory Visit: Payer: Self-pay

## 2021-10-19 DIAGNOSIS — Z86011 Personal history of benign neoplasm of the brain: Secondary | ICD-10-CM | POA: Insufficient documentation

## 2021-10-19 DIAGNOSIS — Z79899 Other long term (current) drug therapy: Secondary | ICD-10-CM | POA: Diagnosis not present

## 2021-10-19 DIAGNOSIS — Z7982 Long term (current) use of aspirin: Secondary | ICD-10-CM | POA: Insufficient documentation

## 2021-10-19 DIAGNOSIS — I10 Essential (primary) hypertension: Secondary | ICD-10-CM | POA: Diagnosis not present

## 2021-10-19 DIAGNOSIS — I25119 Atherosclerotic heart disease of native coronary artery with unspecified angina pectoris: Secondary | ICD-10-CM | POA: Diagnosis not present

## 2021-10-19 DIAGNOSIS — Z955 Presence of coronary angioplasty implant and graft: Secondary | ICD-10-CM | POA: Diagnosis not present

## 2021-10-19 DIAGNOSIS — K219 Gastro-esophageal reflux disease without esophagitis: Secondary | ICD-10-CM | POA: Diagnosis present

## 2021-10-19 DIAGNOSIS — R079 Chest pain, unspecified: Secondary | ICD-10-CM

## 2021-10-19 DIAGNOSIS — I209 Angina pectoris, unspecified: Secondary | ICD-10-CM

## 2021-10-19 DIAGNOSIS — R636 Underweight: Secondary | ICD-10-CM | POA: Diagnosis not present

## 2021-10-19 DIAGNOSIS — Z8673 Personal history of transient ischemic attack (TIA), and cerebral infarction without residual deficits: Secondary | ICD-10-CM | POA: Diagnosis not present

## 2021-10-19 DIAGNOSIS — I2510 Atherosclerotic heart disease of native coronary artery without angina pectoris: Secondary | ICD-10-CM

## 2021-10-19 DIAGNOSIS — R0789 Other chest pain: Secondary | ICD-10-CM | POA: Diagnosis present

## 2021-10-19 LAB — COMPREHENSIVE METABOLIC PANEL
ALT: 18 U/L (ref 0–44)
AST: 26 U/L (ref 15–41)
Albumin: 3.7 g/dL (ref 3.5–5.0)
Alkaline Phosphatase: 58 U/L (ref 38–126)
Anion gap: 8 (ref 5–15)
BUN: 32 mg/dL — ABNORMAL HIGH (ref 8–23)
CO2: 27 mmol/L (ref 22–32)
Calcium: 9 mg/dL (ref 8.9–10.3)
Chloride: 107 mmol/L (ref 98–111)
Creatinine, Ser: 0.86 mg/dL (ref 0.44–1.00)
GFR, Estimated: >60 mL/min (ref >60)
Glucose, Bld: 104 mg/dL — ABNORMAL HIGH (ref 70–99)
Potassium: 3.4 mmol/L — ABNORMAL LOW (ref 3.5–5.1)
Sodium: 142 mmol/L (ref 135–145)
Total Bilirubin: 0.7 mg/dL (ref 0.3–1.2)
Total Protein: 6.4 g/dL — ABNORMAL LOW (ref 6.5–8.1)

## 2021-10-19 LAB — CBC WITH DIFFERENTIAL/PLATELET
Abs Immature Granulocytes: 0.02 10*3/uL (ref 0.00–0.07)
Basophils Absolute: 0.1 10*3/uL (ref 0.0–0.1)
Basophils Relative: 1 %
Eosinophils Absolute: 0.4 10*3/uL (ref 0.0–0.5)
Eosinophils Relative: 6 %
HCT: 33 % — ABNORMAL LOW (ref 36.0–46.0)
Hemoglobin: 11 g/dL — ABNORMAL LOW (ref 12.0–15.0)
Immature Granulocytes: 0 %
Lymphocytes Relative: 39 %
Lymphs Abs: 2.5 10*3/uL (ref 0.7–4.0)
MCH: 31.9 pg (ref 26.0–34.0)
MCHC: 33.3 g/dL (ref 30.0–36.0)
MCV: 95.7 fL (ref 80.0–100.0)
Monocytes Absolute: 0.8 10*3/uL (ref 0.1–1.0)
Monocytes Relative: 12 %
Neutro Abs: 2.6 10*3/uL (ref 1.7–7.7)
Neutrophils Relative %: 42 %
Platelets: 208 10*3/uL (ref 150–400)
RBC: 3.45 MIL/uL — ABNORMAL LOW (ref 3.87–5.11)
RDW: 12.3 % (ref 11.5–15.5)
WBC: 6.2 10*3/uL (ref 4.0–10.5)
nRBC: 0 % (ref 0.0–0.2)

## 2021-10-19 LAB — TROPONIN I (HIGH SENSITIVITY): Troponin I (High Sensitivity): 3 ng/L (ref <18)

## 2021-10-19 MED ORDER — IOHEXOL 350 MG/ML SOLN
80.0000 mL | Freq: Once | INTRAVENOUS | Status: AC | PRN
  Administered 2021-10-19: 75 mL via INTRAVENOUS

## 2021-10-19 MED ORDER — SODIUM CHLORIDE 0.9 % IV BOLUS
500.0000 mL | Freq: Once | INTRAVENOUS | Status: AC
  Administered 2021-10-19: 500 mL via INTRAVENOUS

## 2021-10-19 NOTE — ED Provider Notes (Signed)
[facility] Provider Note    None    (approximate)   History   Chest Pain (Pt arrived via ems from home for c/o chest pain that started this evening while watching TV. Pt reports taking nitro x3 and x3 ASA '81mg'$  and report relief. Stent placed x5 years ago. )   HPI  Karen Liu is a 88 y.o. female history of CAD status post stent presents to the ER for evaluation of midsternal nonradiating chest pain and pressure associated with some clamminess lasting roughly 20 minutes this evening while she was sitting watching TV.  She has a history of heartburn but this felt more severe.  States that she did take aspirin as well as 3 nitro.  She is currently pain-free.  Denies any discomfort or tightness.  No fevers or chills.  No abdominal pain.     Physical Exam   Triage Vital Signs: ED Triage Vitals  Enc Vitals Group     BP      Pulse      Resp      Temp      Temp src      SpO2      Weight      Height      Head Circumference      Peak Flow      Pain Score      Pain Loc      Pain Edu?      Excl. in [place]?     Most recent vital signs: Vitals:   10/19/21 2222 10/19/21 2230  BP:  (!) 110/58  Pulse:  69  Resp:  15  Temp: 98.2 F (36.8 C)   SpO2:  100%     Constitutional: Alert  Eyes: Conjunctivae are normal.  Head: Atraumatic. Nose: No congestion/rhinnorhea. Mouth/Throat: Mucous membranes are moist.   Neck: Painless ROM.  Cardiovascular:   Good peripheral circulation. Respiratory: Normal respiratory effort.  No retractions.  Gastrointestinal: Soft and nontender.  Musculoskeletal:  no deformity Neurologic:  MAE spontaneously. No gross focal neurologic deficits are appreciated.  Skin:  Skin is warm, dry and intact. No rash noted. Psychiatric: Mood and affect are normal. Speech and behavior are normal.    ED Results / Procedures / Treatments   Labs (all labs ordered are listed, but only abnormal results are displayed) Labs Reviewed  CBC  WITH DIFFERENTIAL/PLATELET - Abnormal; Notable for the following components:      Result Value   RBC 3.45 (*)    Hemoglobin 11.0 (*)    HCT 33.0 (*)    All other components within normal limits  COMPREHENSIVE METABOLIC PANEL - Abnormal; Notable for the following components:   Potassium 3.4 (*)    Glucose, Bld 104 (*)    BUN 32 (*)    Total Protein 6.4 (*)    All other components within normal limits  TROPONIN I (HIGH SENSITIVITY)     EKG  ED ECG REPORT I, Merlyn Lot, the attending physician, personally viewed and interpreted this ECG.   Date: 10/19/2021  EKG Time: 22:12  Rate: 70  Rhythm: sinus  Axis: normal  Intervals: normal  ST&T Change: no stemi, no depression    RADIOLOGY Please see ED Course for my review and interpretation.  I personally reviewed all radiographic images ordered to evaluate for the above acute complaints and reviewed radiology reports and findings.  These findings were personally discussed with the patient.  Please see medical record for radiology report.  PROCEDURES:  Critical Care performed: No  Procedures   MEDICATIONS ORDERED IN ED: Medications  sodium chloride 0.9 % bolus 500 mL (500 mLs Intravenous New Bag/Given 10/19/21 2313)     IMPRESSION / MDM / ASSESSMENT AND PLAN / ED COURSE  I reviewed the triage vital signs and the nursing notes.                              Differential diagnosis includes, but is not limited to, ACS, pericarditis, esophagitis, boerhaaves, pe, dissection, pna, bronchitis, costochondritis  Patient presented to the ER for evaluation of symptoms as described above.  This presenting complaint could reflect a potentially life-threatening illness therefore the patient will be placed on continuous pulse oximetry and telemetry for monitoring.  Laboratory evaluation will be sent to evaluate for the above complaints.  Pain-free.  EKG nonischemic.  Initial troponin is negative.  Chest x-ray on my review and  interpretation does not show any evidence of pneumothorax or consolidation.  Will order CTA to evaluate for intrathoracic pathology.  Patient will be signed out to oncoming physician pending follow-up repeat [place] CT imaging and reassessment.     FINAL CLINICAL IMPRESSION(S) / ED DIAGNOSES   Final diagnoses:  Chest pain, unspecified type     Rx / DC Orders   ED Discharge Orders     None        Note:  This document was prepared using Dragon voice recognition software and may include unintentional dictation errors.    Merlyn Lot, MD 10/19/21 2320

## 2021-10-20 DIAGNOSIS — R079 Chest pain, unspecified: Secondary | ICD-10-CM | POA: Diagnosis not present

## 2021-10-20 DIAGNOSIS — I209 Angina pectoris, unspecified: Secondary | ICD-10-CM

## 2021-10-20 DIAGNOSIS — E785 Hyperlipidemia, unspecified: Secondary | ICD-10-CM | POA: Diagnosis not present

## 2021-10-20 DIAGNOSIS — I10 Essential (primary) hypertension: Secondary | ICD-10-CM | POA: Diagnosis not present

## 2021-10-20 DIAGNOSIS — R636 Underweight: Secondary | ICD-10-CM

## 2021-10-20 DIAGNOSIS — I25119 Atherosclerotic heart disease of native coronary artery with unspecified angina pectoris: Secondary | ICD-10-CM | POA: Diagnosis not present

## 2021-10-20 DIAGNOSIS — K219 Gastro-esophageal reflux disease without esophagitis: Secondary | ICD-10-CM

## 2021-10-20 LAB — TROPONIN I (HIGH SENSITIVITY): Troponin I (High Sensitivity): 3 ng/L (ref <18)

## 2021-10-20 MED ORDER — ACETAMINOPHEN 325 MG PO TABS: 650.0000 mg | ORAL_TABLET | ORAL | Status: DC | PRN

## 2021-10-20 MED ORDER — ATORVASTATIN CALCIUM 20 MG PO TABS: 40.0000 mg | ORAL_TABLET | Freq: Every day | ORAL | Status: DC

## 2021-10-20 MED ORDER — ONDANSETRON HCL 4 MG/2ML IJ SOLN: 4.0000 mg | Freq: Four times a day (QID) | INTRAMUSCULAR | Status: DC | PRN

## 2021-10-20 MED ORDER — NITROGLYCERIN 0.4 MG SL SUBL: 0.4000 mg | SUBLINGUAL_TABLET | SUBLINGUAL | Status: DC | PRN

## 2021-10-20 MED ORDER — ASPIRIN 81 MG PO TBEC
81.0000 mg | DELAYED_RELEASE_TABLET | Freq: Every morning | ORAL | Status: DC
  Administered 2021-10-20: 81 mg via ORAL
  Filled 2021-10-20: qty 1

## 2021-10-20 MED ORDER — POTASSIUM CITRATE ER 10 MEQ (1080 MG) PO TBCR
10.0000 meq | EXTENDED_RELEASE_TABLET | Freq: Two times a day (BID) | ORAL | Status: DC
  Administered 2021-10-20: 10 meq via ORAL
  Filled 2021-10-20 (×2): qty 1

## 2021-10-20 MED ORDER — ALPRAZOLAM 0.25 MG PO TABS: 0.2500 mg | ORAL_TABLET | Freq: Two times a day (BID) | ORAL | Status: DC | PRN

## 2021-10-20 MED ORDER — PANTOPRAZOLE SODIUM 40 MG PO TBEC
40.0000 mg | DELAYED_RELEASE_TABLET | Freq: Every day | ORAL | Status: DC
  Administered 2021-10-20: 40 mg via ORAL
  Filled 2021-10-20: qty 1

## 2021-10-20 MED ORDER — ENOXAPARIN SODIUM 40 MG/0.4ML IJ SOSY: 40.0000 mg | PREFILLED_SYRINGE | INTRAMUSCULAR | Status: DC

## 2021-10-20 NOTE — ED Provider Notes (Signed)
-----------------------------------------   12:43 AM on 10/20/2021 -----------------------------------------   CTA chest:  1. No acute intrathoracic pathology. No CT evidence of pulmonary  artery embolus.  2. Aortic Atherosclerosis (ICD10-I70.0).   Updated patient and her family member on CTA results.  Given her cardiac history, coupled with the fact that she took 3 nitroglycerin and had a period of diaphoresis, we did discuss hospitalization to which patient is agreeable.  Will consult hospitalist services for evaluation and admission.   Paulette Blanch, MD 10/20/21 [phone]

## 2021-10-20 NOTE — Assessment & Plan Note (Addendum)
CAD s/p DES to RCA 2019 Took full dose aspirin at home No further episodes since arrival Continue to trend troponin Continue daily aspirin, atorvastatin.  Not currently on beta-blocker for unknown reason Sublingual nitroglycerin as needed chest pain with morphine for breakthrough Cardiology consult to decide on further risk stratification We will keep n.p.o. in case of procedure

## 2021-10-20 NOTE — Hospital Course (Signed)
GLORIANNA GOTT is a 88 y.o. female with medical history significant for CAD s/p DES to RCA 2019, history of CVA 2012, GERD, HTN, HLD, last seen by primary cardiologist Dr. Fletcher Anon on 04/09/2021 at which time she was doing well without anginal symptoms who presented to the ED with an episode of chest pain associated with diaphoresis lasting up to 20 minutes, finally relieved after 3 nitroglycerin and aspirin.  She denies associated nausea, vomiting, palpitations or shortness of breath and was chest pain-free on arrival to the ED.  She was previously in her usual state of health.  ED course and data review: BP 111/55 with otherwise normal vitals on arrival with BP improving to 124/76 at the time of admission.  Labs with first troponin of 3, potassium 3.4, hemoglobin 11 otherwise unremarkable.  EKG, personally viewed and interpreted showing sinus rhythm at 73 with no acute ST-T wave changes CTA chest was done which showed no acute intrathoracic pathology.  Patient did not had any more chest pain.  Troponin remain negative. Cardiology evaluated her and she was able to ambulate without any difficulty or recurrence of chest pain.  Cardiology is recommending outpatient follow-up with her own cardiologist and a possible Myoview for further investigation which will be done as an outpatient.  Patient is being discharged on current medications and will follow-up with her providers for further recommendations.

## 2021-10-20 NOTE — Consult Note (Signed)
Cardiology Consultation:   Patient ID: Karen Liu MRN: [id number]; DOB: [birth year]  Admit date: 10/19/2021 Date of Consult: 10/20/2021  PCP:  Baxter Hire, MD   [facility] Providers Cardiologist:  DR. Fletcher Anon Physician requesting consult: Dr. Damita Dunnings For consult: Chest pain/angina   Patient Profile:   Karen Liu is a 88 y.o. female with a hx of coronary artery disease, history of stenting to RCA in 2019, stroke 2012, GERD, hypertension, hyperlipidemia who presents with chest pain History of Present Illness:   Karen Liu reports that she was in her usual state of health, had had something to eat for dinner, around 9 PM developed acute onset substernal chest pain with no radiation while laying in bed.  Diaphoresis noted.  Reports chest pain was severe, she took up to 3 nitro, symptoms scared her and she called EMS  symptoms seem to resolve by the time EMS arrived, symptoms essentially resolving by the time she reached the emergency room.  She estimates chest pain lasting 20 minutes Denies other recent episodes of chest pain on exertion Symptoms somewhat different from prior anginal symptoms, in 2019 had chest pain on the right worse with standing and movement.   Denies any significant chest pain overnight, feels well this morning, eating lunch.  She has ambulated in the hallway without difficulty  Cardiac enzymes negative EKG nonacute, no changes from prior EKGs  Chest CTA in the ER performed, no PE  Past Medical History:  Diagnosis Date   Arthritis    neck   Brain tumor (HCC)    CAD (coronary artery disease)    a. LHC 7/16: 50% mLAD not sig by FFR (0.9), mild LCx/RCA dz,EF & LVEDP nl; b. LHC 7/19: mLAD 40%, mLCx 30%, ostRCA 30%, mRCA 99% (3.0x26 Resolute DES), nl LVSF, mildly elevated LVEDP.   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   Essential hypertension    GERD (gastroesophageal reflux disease)    History of CVA (cerebrovascular accident) 01/02/2011    Overview:  Left   History of kidney stones    Hyperlipidemia    Hypertension    Motion sickness    cars   PONV (postoperative nausea and vomiting)    Vertigo    before brain surgery    Past Surgical History:  Procedure Laterality Date   ABDOMINAL HYSTERECTOMY     APPENDECTOMY     BRAIN SURGERY  brain tumor removed 2013   CARDIAC CATHETERIZATION N/A 09/21/2014   Procedure: Left Heart Cath and Coronary Angiography;  Surgeon: Wellington Hampshire, MD;  Location: [place] [facility];  Service: Cardiovascular;  Laterality: N/A;   CATARACT EXTRACTION W/ INTRAOCULAR LENS  IMPLANT, BILATERAL     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   RESECTION CEREBELLAR MENINGIOMA   COLONOSCOPY     CORONARY STENT INTERVENTION N/A 09/21/2017   Procedure: CORONARY STENT INTERVENTION;  Surgeon: Wellington Hampshire, MD;  Location: [name] [facility];  Service: Cardiovascular;  Laterality: N/A;   ESOPHAGOGASTRODUODENOSCOPY     ESOPHAGOGASTRODUODENOSCOPY (EGD) WITH PROPOFOL N/A 11/13/2014   Procedure: ESOPHAGOGASTRODUODENOSCOPY (EGD) WITH PROPOFOL;  Surgeon: Manya Silvas, MD;  [facility];  Service: Endoscopy;  Laterality: N/A;   LEFT HEART CATH Right 09/21/2017   Procedure: Left Heart Cath;  Surgeon: Wellington Hampshire, MD;  Location: [place] [facility];  Service: Cardiovascular;  Laterality: Right;   MICROLARYNGOSCOPY N/A 11/22/2014   Procedure: MICROLARYNGOSCOPY DIRECT WITH REMOVAL EPIGLOTTIC CYST;  Surgeon: Beverly Gust, MD;  Location: [place];  Service: ENT;  Laterality: N/A;     Home Medications:  Prior to Admission medications   Medication Sig Start Date End Date Taking? Authorizing Provider  amLODipine (NORVASC) 2.5 MG tablet Take 1 tablet (2.5 mg total) by mouth daily. 04/09/21   Wellington Hampshire, MD  aspirin EC 81 MG tablet Take 1 tablet by mouth every morning.    [provider]  atorvastatin (LIPITOR) 40 MG tablet Take 1 tablet (40 mg total) by mouth daily at 6 PM.  09/20/20 04/09/21  Wellington Hampshire, MD  calcium-vitamin D (OSCAL WITH D) 500-200 MG-UNIT tablet Take 2 tablets by mouth daily with breakfast.    [provider]  cetirizine (ZYRTEC) 10 MG chewable tablet Chew 10 mg by mouth daily as needed.     [provider]  famotidine (PEPCID) 40 MG tablet Take 40 mg by mouth daily. 12/02/19   [provider]  nitroGLYCERIN (NITROSTAT) 0.4 MG SL tablet Place 1 tablet (0.4 mg total) under the tongue every 5 (five) minutes as needed for chest pain. 09/22/17   Theora Gianotti, NP  pantoprazole (PROTONIX) 40 MG tablet Take 40 mg by mouth daily.    [provider]  potassium citrate (UROCIT-K) 10 MEQ (1080 MG) SR tablet Take 1 tablet (10 mEq total) by mouth 2 (two) times daily. 04/18/21   Stoioff, Ronda Fairly, MD  vitamin B-12 (CYANOCOBALAMIN) 500 MCG tablet Take 1,000 mcg by mouth every morning.  02/18/11   [provider]    Inpatient Medications: Scheduled Meds:  aspirin EC  81 mg Oral q morning   atorvastatin  40 mg Oral q1800   pantoprazole  40 mg Oral Daily   potassium citrate  10 mEq Oral BID   Continuous Infusions:  PRN Meds: acetaminophen, ALPRAZolam, nitroGLYCERIN, ondansetron (ZOFRAN) IV  Allergies:    Allergies  Allergen Reactions   Regadenoson     Severe chest pain, dyspnea and distress (prolonged) Severe chest pain, dyspnea and distress (prolonged)   Other Rash    Other reaction(s): HIVES    Phenazopyridine Rash   Pyridium [Phenazopyridine Hcl] Rash   Sulfa Antibiotics Rash    Other reaction(s): HIVES   Sulfacetamide Sodium Rash    Other reaction(s): HIVES   Tamsulosin Nausea And Vomiting    Social History:   Social History   Socioeconomic History   Marital status: Widowed    Spouse name: Not on file   Number of children: Not on file   Years of education: Not on file   Highest education level: Not on file  Occupational History   Not on file  Tobacco Use   Smoking status:  Never   Smokeless tobacco: Never  Vaping Use   Vaping Use: Never used  Substance and Sexual Activity   Alcohol use: No   Drug use: No   Sexual activity: Not on file  Other Topics Concern   Not on file  Social History Narrative   Not on file   Social Determinants of Health   Financial Resource Strain: Not on file  Food Insecurity: Not on file  Transportation Needs: Not on file  Physical Activity: Not on file  Stress: Not on file  Social Connections: Not on file  Intimate Partner Violence: Not on file    Family History:    Family History  Problem Relation Age of Onset   Heart attack Mother 51   Heart disease Mother    Hypertension Mother    Hyperlipidemia Mother  Breast cancer Neg Hx      ROS:  Please see the history of present illness.  Review of Systems  Constitutional: Negative.   HENT: Negative.    Respiratory: Negative.    Cardiovascular:  Positive for chest pain.  Gastrointestinal: Negative.   Musculoskeletal: Negative.   Neurological: Negative.   Psychiatric/Behavioral: Negative.    All other systems reviewed and are negative.  Physical Exam/Data:   Vitals:   10/20/21 0600 10/20/21 0617 10/20/21 0700 10/20/21 1112  BP: 115/61  114/64 99/88  Pulse: 72  74 84  Resp: '17  10 19  '$ Temp:  98 F (36.7 C)  98.2 F (36.8 C)  TempSrc:  Oral  Oral  SpO2: 98%  98% 100%  Weight:      Height:       No intake or output data in the 24 hours ending 10/20/21 1223    10/19/2021   10:12 PM 04/09/2021    4:11 PM 09/20/2020    9:22 AM  Last 3 Weights  Weight (lbs) 110 lb 116 lb 6 oz 124 lb 8 oz  Weight (kg) 49.896 kg 52.787 kg 56.473 kg     Body mass index is 17.75 kg/m.  General:  Well nourished, well developed, in no acute distress HEENT: normal Neck: no JVD Vascular: No carotid bruits; Distal pulses 2+ bilaterally Cardiac:  normal S1, S2; RRR; no murmur  Lungs:  clear to auscultation bilaterally, no wheezing, rhonchi or rales  Abd: soft, nontender, no  hepatomegaly  Ext: no edema Musculoskeletal:  No deformities, BUE and BLE strength normal and equal Skin: warm and dry  Neuro:  CNs 2-12 intact, no focal abnormalities noted Psych:  Normal affect   EKG:  The EKG was personally reviewed and demonstrates:   Normal sinus rhythm with no significant ST-T wave changes Telemetry:  Telemetry was personally reviewed and demonstrates:   Normal sinus rhythm, no arrhythmia noted  Relevant CV Studies: Chest CTA, no PE  Laboratory Data:  High Sensitivity Troponin:   Recent Labs  Lab 10/19/21 2218 10/20/21 0031  TROPONINIHS 3 3     Chemistry Recent Labs  Lab 10/19/21 2218  NA 142  K 3.4*  CL 107  CO2 27  GLUCOSE 104*  BUN 32*  CREATININE 0.86  CALCIUM 9.0  GFRNONAA >60  ANIONGAP 8    Recent Labs  Lab 10/19/21 2218  PROT 6.4*  ALBUMIN 3.7  AST 26  ALT 18  ALKPHOS 58  BILITOT 0.7   Lipids No results for input(s): "CHOL", "TRIG", "HDL", "LABVLDL", "LDLCALC", "CHOLHDL" in the last 168 hours.  Hematology Recent Labs  Lab 10/19/21 2218  WBC 6.2  RBC 3.45*  HGB 11.0*  HCT 33.0*  MCV 95.7  MCH 31.9  MCHC 33.3  RDW 12.3  PLT 208   Thyroid No results for input(s): "TSH", "FREET4" in the last 168 hours.  BNPNo results for input(s): "BNP", "PROBNP" in the last 168 hours.  DDimer No results for input(s): "DDIMER" in the last 168 hours.   Radiology/Studies:  CT Angio Chest PE W and/or Wo Contrast  Result Date: 10/20/2021 CLINICAL DATA:  Concern for pulmonary embolism. EXAM: CT ANGIOGRAPHY CHEST WITH CONTRAST TECHNIQUE: Multidetector CT imaging of the chest was performed using the standard protocol during bolus administration of intravenous contrast. Multiplanar CT image reconstructions and MIPs were obtained to evaluate the vascular anatomy. RADIATION DOSE REDUCTION: This exam was performed according to the departmental dose-optimization program which includes automated exposure control, adjustment of the  mA and/or kV  according to patient size and/or use of iterative reconstruction technique. CONTRAST:  37m OMNIPAQUE IOHEXOL 350 MG/ML SOLN COMPARISON:  Chest CT dated 09/21/2017 and radiograph dated 10/19/2021. FINDINGS: Cardiovascular: There is no cardiomegaly or pericardial effusion. Coronary vascular calcification. Moderate atherosclerotic calcification of the thoracic aorta. No aneurysmal dilatation or dissection. The origins of the great vessels of the aortic arch appear patent as visualized. No pulmonary artery embolus identified. Mediastinum/Nodes: No hilar or mediastinal adenopathy. The esophagus is slightly patulous otherwise grossly unremarkable. No mediastinal fluid collection. Lungs/Pleura: Biapical subpleural and bibasilar scarring. No new consolidation. There is no pleural effusion pneumothorax. The central airways are patent. Upper Abdomen: Small calcified liver granuloma. Mild fullness of the renal collecting systems. Scattered colonic diverticula. No acute findings. Musculoskeletal: Osteopenia with degenerative changes of the spine. No acute osseous pathology. Review of the MIP images confirms the above findings. IMPRESSION: 1. No acute intrathoracic pathology. No CT evidence of pulmonary artery embolus. 2. Aortic Atherosclerosis (ICD10-I70.0). Electronically Signed   By: AAnner CreteM.D.   On: 10/20/2021 00:13   DG Chest Portable 1 View  Result Date: 10/19/2021 CLINICAL DATA:  Chest pain. EXAM: PORTABLE CHEST 1 VIEW COMPARISON:  12/28/2017, CT 09/21/2017 FINDINGS: The cardiomediastinal contours are normal. Atherosclerosis of the aortic arch. The lungs are clear. Pulmonary vasculature is normal. No consolidation, pleural effusion, or pneumothorax. No acute osseous abnormalities are seen. IMPRESSION: No acute chest findings. Electronically Signed   By: MKeith RakeM.D.   On: 10/19/2021 22:55     Assessment and Plan:   Chest pain Atypical and typical features in a patient with known coronary  artery disease, prior stenting in 2019 No significant EKG changes, cardiac enzymes negative Chest pain lasting 20 minutes, unable to exclude GI etiology Felt well overnight, She prefers to go home as she feels well with no complaints, eating lunch with no symptoms -We will recommend outpatient Myoview and follow-up in clinic with Dr. AFletcher Anon to discuss results She is happy with this plan, discussed return precautions if she has recurrent anginal symptoms On aspirin, statin.  Notes indicating beta-blocker previously held for side effects  2.  Essential hypertension With continue current outpatient regimen amlodipine 2.5 daily Blood pressure well controlled, if not low  3.  Hyperlipidemia Continue Lipitor 40 daily Total cholesterol 142 LDL 71 As outpatient could consider adding Zetia to achieve goal LDL into the 50s  4.  GERD On pantoprazole    Total encounter time more than 80 minutes  Greater than 50% was spent in counseling and coordination of care with the patient   For questions or updates, please contact CDeer ParkPlease consult [email or web address] for contact info under    Signed, TIda Rogue MD  10/20/2021 12:23 PM

## 2021-10-20 NOTE — ED Notes (Signed)
Admit CP/UA

## 2021-10-20 NOTE — ED Notes (Addendum)
Pt assisted back into bed and placed back on monitors. Pt denies any needs at this time. Care ongoing

## 2021-10-20 NOTE — Assessment & Plan Note (Signed)
Continue pantoprazole. °

## 2021-10-20 NOTE — Assessment & Plan Note (Signed)
Blood pressure was borderline, likely related to nitroglycerin Hold amlodipine

## 2021-10-20 NOTE — Discharge Summary (Signed)
Physician Discharge Summary   Patient: Karen Liu MRN: [id number] DOB: 09/18/1930  Admit date:     10/19/2021  Discharge date: 10/20/21  Discharge Physician: Lorella Nimrod   PCP: Baxter Hire, MD   Recommendations at discharge:  Follow-up with cardiology within a week  Discharge Diagnoses: Principal Problem:   Angina pectoris ([place]) Active Problems:   CAD with history of PCI 2019 (coronary artery disease)   History of CVA (cerebrovascular accident)   Essential hypertension   GERD (gastroesophageal reflux disease)   Underweight   Hospital Course: Karen Liu is a 88 y.o. female with medical history significant for CAD s/p DES to RCA 2019, history of CVA 2012, GERD, HTN, HLD, last seen by primary cardiologist Dr. Fletcher Anon on 04/09/2021 at which time she was doing well without anginal symptoms who presented to the ED with an episode of chest pain associated with diaphoresis lasting up to 20 minutes, finally relieved after 3 nitroglycerin and aspirin.  She denies associated nausea, vomiting, palpitations or shortness of breath and was chest pain-free on arrival to the ED.  She was previously in her usual state of health.  ED course and data review: BP 111/55 with otherwise normal vitals on arrival with BP improving to 124/76 at the time of admission.  Labs with first troponin of 3, potassium 3.4, hemoglobin 11 otherwise unremarkable.  EKG, personally viewed and interpreted showing sinus rhythm at 73 with no acute ST-T wave changes CTA chest was done which showed no acute intrathoracic pathology.  Patient did not had any more chest pain.  Troponin remain negative. Cardiology evaluated her and she was able to ambulate without any difficulty or recurrence of chest pain.  Cardiology is recommending outpatient follow-up with her own cardiologist and a possible Myoview for further investigation which will be done as an outpatient.  Patient is being discharged on current medications and will  follow-up with her providers for further recommendations.  Assessment and Plan: * Angina pectoris ([place]) CAD s/p DES to RCA 2019 Took full dose aspirin at home No further episodes since arrival Continue to trend troponin Continue daily aspirin, atorvastatin.  Not currently on beta-blocker for unknown reason Sublingual nitroglycerin as needed chest pain with morphine for breakthrough Cardiology consult to decide on further risk stratification We will keep n.p.o. in case of procedure  History of CVA (cerebrovascular accident) Continue aspirin and atorvastatin  Underweight BMI 17.75.  Can consider nutritionist eval  GERD (gastroesophageal reflux disease) Continue pantoprazole  Essential hypertension Blood pressure was borderline, likely related to nitroglycerin Hold amlodipine   Consultants: Cardiology Procedures performed: None Disposition: Home Diet recommendation:  Discharge Diet Orders (From admission, onward)     Start     Ordered   10/20/21 0000  Diet - low sodium heart healthy        10/20/21 1247           Cardiac and Carb modified diet DISCHARGE MEDICATION: Allergies as of 10/20/2021       Reactions   Regadenoson    Severe chest pain, dyspnea and distress (prolonged) Severe chest pain, dyspnea and distress (prolonged)   Other Rash   Other reaction(s): HIVES   Phenazopyridine Rash   Pyridium [phenazopyridine Hcl] Rash   Sulfa Antibiotics Rash   Other reaction(s): HIVES   Sulfacetamide Sodium Rash   Other reaction(s): HIVES   Tamsulosin Nausea And Vomiting        Medication List     TAKE these medications    amLODipine  2.5 MG tablet Commonly known as: NORVASC Take 1 tablet (2.5 mg total) by mouth daily.   aspirin EC 81 MG tablet Take 1 tablet by mouth every morning.   atorvastatin 40 MG tablet Commonly known as: LIPITOR Take 1 tablet (40 mg total) by mouth daily at 6 PM.   calcium-vitamin D 500-200 MG-UNIT tablet Commonly known as:  OSCAL WITH D Take 2 tablets by mouth daily with breakfast.   cetirizine 10 MG chewable tablet Commonly known as: ZYRTEC Chew 10 mg by mouth daily as needed.   cyanocobalamin 500 MCG tablet Commonly known as: VITAMIN B12 Take 1,000 mcg by mouth every morning.   famotidine 40 MG tablet Commonly known as: PEPCID Take 40 mg by mouth daily.   fluticasone 50 MCG/ACT nasal spray Commonly known as: FLONASE Place 2 sprays into the nose 2 (two) times daily as needed.   nitroGLYCERIN 0.4 MG SL tablet Commonly known as: NITROSTAT Place 1 tablet (0.4 mg total) under the tongue every 5 (five) minutes as needed for chest pain.   pantoprazole 40 MG tablet Commonly known as: PROTONIX Take 40 mg by mouth daily.   potassium citrate 10 MEQ (1080 MG) SR tablet Commonly known as: UROCIT-K Take 1 tablet (10 mEq total) by mouth 2 (two) times daily.        Follow-up Information     Baxter Hire, MD. Schedule an appointment as soon as possible for a visit in 1 week(s).   Specialty: Internal Medicine Contact information: [place] [address] [phone]         Wellington Hampshire, MD. Schedule an appointment as soon as possible for a visit in 1 week(s).   Specialty: Cardiology Contact information: [place] [address] [phone]                Discharge Exam: Danley Danker Weights   10/19/21 2212  Weight: 49.9 kg   General.     In no acute distress. Pulmonary.  Lungs clear bilaterally, normal respiratory effort. CV.  Regular rate and rhythm, no JVD, rub or murmur. Abdomen.  Soft, nontender, nondistended, BS positive. CNS.  Alert and oriented .  No focal neurologic deficit. Extremities.  No edema, no cyanosis, pulses intact and symmetrical. Psychiatry.  Judgment and insight appears normal.   Condition at discharge: stable  The results of significant diagnostics from this hospitalization (including imaging,  microbiology, ancillary and laboratory) are listed below for reference.   Imaging Studies: CT Angio Chest PE W and/or Wo Contrast  Result Date: 10/20/2021 CLINICAL DATA:  Concern for pulmonary embolism. EXAM: CT ANGIOGRAPHY CHEST WITH CONTRAST TECHNIQUE: Multidetector CT imaging of the chest was performed using the standard protocol during bolus administration of intravenous contrast. Multiplanar CT image reconstructions and MIPs were obtained to evaluate the vascular anatomy. RADIATION DOSE REDUCTION: This exam was performed according to the departmental dose-optimization program which includes automated exposure control, adjustment of the mA and/or kV according to patient size and/or use of iterative reconstruction technique. CONTRAST:  62m OMNIPAQUE IOHEXOL 350 MG/ML SOLN COMPARISON:  Chest CT dated 09/21/2017 and radiograph dated 10/19/2021. FINDINGS: Cardiovascular: There is no cardiomegaly or pericardial effusion. Coronary vascular calcification. Moderate atherosclerotic calcification of the thoracic aorta. No aneurysmal dilatation or dissection. The origins of the great vessels of the aortic arch appear patent as visualized. No pulmonary artery embolus identified. Mediastinum/Nodes: No hilar or mediastinal adenopathy. The esophagus is slightly patulous otherwise grossly unremarkable. No mediastinal fluid collection.  Lungs/Pleura: Biapical subpleural and bibasilar scarring. No new consolidation. There is no pleural effusion pneumothorax. The central airways are patent. Upper Abdomen: Small calcified liver granuloma. Mild fullness of the renal collecting systems. Scattered colonic diverticula. No acute findings. Musculoskeletal: Osteopenia with degenerative changes of the spine. No acute osseous pathology. Review of the MIP images confirms the above findings. IMPRESSION: 1. No acute intrathoracic pathology. No CT evidence of pulmonary artery embolus. 2. Aortic Atherosclerosis (ICD10-I70.0). Electronically  Signed   By: Anner Crete M.D.   On: 10/20/2021 00:13   DG Chest Portable 1 View  Result Date: 10/19/2021 CLINICAL DATA:  Chest pain. EXAM: PORTABLE CHEST 1 VIEW COMPARISON:  12/28/2017, CT 09/21/2017 FINDINGS: The cardiomediastinal contours are normal. Atherosclerosis of the aortic arch. The lungs are clear. Pulmonary vasculature is normal. No consolidation, pleural effusion, or pneumothorax. No acute osseous abnormalities are seen. IMPRESSION: No acute chest findings. Electronically Signed   By: Keith Rake M.D.   On: 10/19/2021 22:55    Microbiology: Results for orders placed or performed during the hospital encounter of 08/16/18  Urine Culture     Status: Abnormal   Collection Time: 08/16/18 10:07 AM   Specimen: Urine, Random  Result Value Ref Range Status   Specimen Description   Final    URINE, RANDOM Performed at [facility], [address] [place], [place] [address]    Special Requests   Final    NONE Performed at [facility], [address] [place], [place] [address]    Culture >=100,000 COLONIES/mL ESCHERICHIA COLI (A)  Final   Report Status 08/18/2018 FINAL  Final   Organism ID, Bacteria ESCHERICHIA COLI (A)  Final      Susceptibility   Escherichia coli - MIC*    AMPICILLIN 8 SENSITIVE Sensitive     CEFAZOLIN <=4 SENSITIVE Sensitive     CEFTRIAXONE <=1 SENSITIVE Sensitive     CIPROFLOXACIN <=0.25 SENSITIVE Sensitive     GENTAMICIN <=1 SENSITIVE Sensitive     IMIPENEM <=0.25 SENSITIVE Sensitive     NITROFURANTOIN <=16 SENSITIVE Sensitive     TRIMETH/SULFA <=20 SENSITIVE Sensitive     AMPICILLIN/SULBACTAM <=2 SENSITIVE Sensitive     PIP/TAZO <=4 SENSITIVE Sensitive     Extended ESBL NEGATIVE Sensitive     * >=100,000 COLONIES/mL ESCHERICHIA COLI    Labs: CBC: Recent Labs  Lab 10/19/21 2218  WBC 6.2  NEUTROABS 2.6  HGB 11.0*  HCT 33.0*  MCV 95.7  PLT 102   Basic Metabolic Panel: Recent Labs  Lab 10/19/21 2218  NA 142  K  3.4*  CL 107  CO2 27  GLUCOSE 104*  BUN 32*  CREATININE 0.86  CALCIUM 9.0   Liver Function Tests: Recent Labs  Lab 10/19/21 2218  AST 26  ALT 18  ALKPHOS 58  BILITOT 0.7  PROT 6.4*  ALBUMIN 3.7   CBG: No results for input(s): "GLUCAP" in the last 168 hours.  Discharge time spent: greater than 30 minutes.  This record has been created using Systems analyst. Errors have been sought and corrected,but may not always be located. Such creation errors do not reflect on the standard of care.   Signed: Lorella Nimrod, MD [facility] 10/20/2021

## 2021-10-20 NOTE — H&P (Signed)
History and Physical    Patient: Karen Liu:[id number] DOB: 1930/04/03 DOA: 10/19/2021 DOS: the patient was seen and examined on 10/20/2021 PCP: Baxter Hire, MD  Patient coming from: Home  Chief Complaint:  Chief Complaint  Patient presents with   Chest Pain    Pt arrived via ems from home for c/o chest pain that started this evening while watching TV. Pt reports taking nitro x3 and x3 ASA '81mg'$  and report relief. Stent placed x5 years ago.     HPI: Karen Liu is a 88 y.o. female with medical history significant for CAD s/p DES to RCA 2019, history of CVA 2012, GERD, HTN, HLD, last seen by primary cardiologist Dr. Fletcher Anon on 04/09/2021 at which time she was doing well without anginal symptoms who presented to the ED with an episode of chest pain associated with diaphoresis lasting up to 20 minutes, finally relieved after 3 nitroglycerin and aspirin.  She denies associated nausea, vomiting, palpitations or shortness of breath and was chest pain-free on arrival to the ED.  She was previously in her usual state of health. ED course and data review: BP 111/55 with otherwise normal vitals on arrival with BP improving to 124/76 at the time of admission.  Labs with first troponin of 3, potassium 3.4, hemoglobin 11 otherwise unremarkable.  EKG, personally viewed and interpreted showing sinus rhythm at 73 with no acute ST-T wave changes CTA chest was done which showed no acute intrathoracic pathology.  Hospitalist consulted for admission for high risk chest pain.   Review of Systems: As mentioned in the history of present illness. All other systems reviewed and are negative.  Past Medical History:  Diagnosis Date   Arthritis    neck   Brain tumor ([place])    CAD (coronary artery disease)    a. LHC 7/16: 50% mLAD not sig by FFR (0.9), mild LCx/RCA dz,EF & LVEDP nl; b. LHC 7/19: mLAD 40%, mLCx 30%, ostRCA 30%, mRCA 99% (3.0x26 Resolute DES), nl LVSF, mildly elevated LVEDP.   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   LEFT   Essential hypertension    GERD (gastroesophageal reflux disease)    History of CVA (cerebrovascular accident) 01/02/2011   Overview:  Left   History of kidney stones    Hyperlipidemia    Hypertension    Motion sickness    cars   PONV (postoperative nausea and vomiting)    Vertigo    before brain surgery   Past Surgical History:  Procedure Laterality Date   ABDOMINAL HYSTERECTOMY     APPENDECTOMY     BRAIN SURGERY  brain tumor removed 2013   CARDIAC CATHETERIZATION N/A 09/21/2014   Procedure: Left Heart Cath and Coronary Angiography;  Surgeon: Wellington Hampshire, MD;  Location: [facility];  Service: Cardiovascular;  Laterality: N/A;   CATARACT EXTRACTION W/ INTRAOCULAR LENS  IMPLANT, BILATERAL     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   RESECTION CEREBELLAR MENINGIOMA   COLONOSCOPY     CORONARY STENT INTERVENTION N/A 09/21/2017   Procedure: CORONARY STENT INTERVENTION;  Surgeon: Wellington Hampshire, MD;  Location: [place] [facility];  Service: Cardiovascular;  Laterality: N/A;   ESOPHAGOGASTRODUODENOSCOPY     ESOPHAGOGASTRODUODENOSCOPY (EGD) WITH PROPOFOL N/A 11/13/2014   Procedure: ESOPHAGOGASTRODUODENOSCOPY (EGD) WITH PROPOFOL;  Surgeon: Manya Silvas, MD;  Location: [facility];  Service: Endoscopy;  Laterality: N/A;   LEFT HEART CATH Right 09/21/2017   Procedure: Left Heart Cath;  Surgeon: Wellington Hampshire, MD;  Location: [place] [facility];  Service: Cardiovascular;  Laterality: Right;   MICROLARYNGOSCOPY N/A 11/22/2014   Procedure: MICROLARYNGOSCOPY DIRECT WITH REMOVAL EPIGLOTTIC CYST;  Surgeon: Beverly Gust, MD;  Location: [facility];  Service: ENT;  Laterality: N/A;   Social History:  reports that she has never smoked. She has never used smokeless tobacco. She reports that she does not drink alcohol and does not use drugs.  Allergies  Allergen Reactions   Regadenoson     Severe chest pain, dyspnea and distress  (prolonged) Severe chest pain, dyspnea and distress (prolonged)   Other Rash    Other reaction(s): HIVES    Phenazopyridine Rash   Pyridium [Phenazopyridine Hcl] Rash   Sulfa Antibiotics Rash    Other reaction(s): HIVES   Sulfacetamide Sodium Rash    Other reaction(s): HIVES   Tamsulosin Nausea And Vomiting    Family History  Problem Relation Age of Onset   Heart attack Mother 12   Heart disease Mother    Hypertension Mother    Hyperlipidemia Mother    Breast cancer Neg Hx     Prior to Admission medications   Medication Sig Start Date End Date Taking? Authorizing Provider  amLODipine (NORVASC) 2.5 MG tablet Take 1 tablet (2.5 mg total) by mouth daily. 04/09/21   Wellington Hampshire, MD  aspirin EC 81 MG tablet Take 1 tablet by mouth every morning.    [provider]  atorvastatin (LIPITOR) 40 MG tablet Take 1 tablet (40 mg total) by mouth daily at 6 PM. 09/20/20 04/09/21  Wellington Hampshire, MD  calcium-vitamin D (OSCAL WITH D) 500-200 MG-UNIT tablet Take 2 tablets by mouth daily with breakfast.    [provider]  cetirizine (ZYRTEC) 10 MG chewable tablet Chew 10 mg by mouth daily as needed.     [provider]  famotidine (PEPCID) 40 MG tablet Take 40 mg by mouth daily. 12/02/19   [provider]  nitroGLYCERIN (NITROSTAT) 0.4 MG SL tablet Place 1 tablet (0.4 mg total) under the tongue every 5 (five) minutes as needed for chest pain. 09/22/17   Theora Gianotti, NP  pantoprazole (PROTONIX) 40 MG tablet Take 40 mg by mouth daily.    [provider]  potassium citrate (UROCIT-K) 10 MEQ (1080 MG) SR tablet Take 1 tablet (10 mEq total) by mouth 2 (two) times daily. 04/18/21   Stoioff, Ronda Fairly, MD  vitamin B-12 (CYANOCOBALAMIN) 500 MCG tablet Take 1,000 mcg by mouth every morning.  02/18/11   [provider]    Physical Exam: Vitals:   10/19/21 2330 10/20/21 0034 10/20/21 0200 10/20/21 0213  BP: 122/62 127/71 124/76   Pulse: 71  91 81   Resp: '18 20 16   '$ Temp:    98 F (36.7 C)  TempSrc:    Oral  SpO2: 100% 100% 99%   Weight:      Height:       Physical Exam Vitals and nursing note reviewed.  Constitutional:      General: She is not in acute distress. HENT:     Head: Normocephalic and atraumatic.  Cardiovascular:     Rate and Rhythm: Normal rate and regular rhythm.     Heart sounds: Normal heart sounds.  Pulmonary:     Effort: Pulmonary effort is normal.     Breath sounds: Normal breath sounds.  Abdominal:     Palpations: Abdomen is soft.     Tenderness: There is no abdominal tenderness.  Neurological:  Mental Status: Mental status is at baseline.     Labs on Admission: I have personally reviewed following labs and imaging studies  CBC: Recent Labs  Lab 10/19/21 2218  WBC 6.2  NEUTROABS 2.6  HGB 11.0*  HCT 33.0*  MCV 95.7  PLT 449   Basic Metabolic Panel: Recent Labs  Lab 10/19/21 2218  NA 142  K 3.4*  CL 107  CO2 27  GLUCOSE 104*  BUN 32*  CREATININE 0.86  CALCIUM 9.0   GFR: Estimated Creatinine Clearance: 35.6 mL/min (by C-G formula based on SCr of 0.86 mg/dL). Liver Function Tests: Recent Labs  Lab 10/19/21 2218  AST 26  ALT 18  ALKPHOS 58  BILITOT 0.7  PROT 6.4*  ALBUMIN 3.7   No results for input(s): "LIPASE", "AMYLASE" in the last 168 hours. No results for input(s): "AMMONIA" in the last 168 hours. Coagulation Profile: No results for input(s): "INR", "PROTIME" in the last 168 hours. Cardiac Enzymes: No results for input(s): "CKTOTAL", "CKMB", "CKMBINDEX", "TROPONINI" in the last 168 hours. BNP (last 3 results) No results for input(s): "PROBNP" in the last 8760 hours. HbA1C: No results for input(s): "HGBA1C" in the last 72 hours. CBG: No results for input(s): "GLUCAP" in the last 168 hours. Lipid Profile: No results for input(s): "CHOL", "HDL", "LDLCALC", "TRIG", "CHOLHDL", "LDLDIRECT" in the last 72 hours. Thyroid Function Tests: No results for  input(s): "TSH", "T4TOTAL", "FREET4", "T3FREE", "THYROIDAB" in the last 72 hours. Anemia Panel: No results for input(s): "VITAMINB12", "FOLATE", "FERRITIN", "TIBC", "IRON", "RETICCTPCT" in the last 72 hours. Urine analysis:    Component Value Date/Time   COLORURINE STRAW (A) 08/16/2018 1007   APPEARANCEUR CLEAR (A) 08/16/2018 1007   APPEARANCEUR Cloudy (A) 02/19/2017 1011   LABSPEC 1.013 08/16/2018 1007   LABSPEC 1.024 02/17/2014 2043   PHURINE 7.0 08/16/2018 1007   GLUCOSEU NEGATIVE 08/16/2018 1007   GLUCOSEU 150 mg/dL 02/17/2014 2043   HGBUR SMALL (A) 08/16/2018 1007   BILIRUBINUR NEGATIVE 08/16/2018 1007   BILIRUBINUR Negative 02/19/2017 1011   BILIRUBINUR Negative 02/17/2014 2043   KETONESUR NEGATIVE 08/16/2018 1007   PROTEINUR NEGATIVE 08/16/2018 1007   NITRITE NEGATIVE 08/16/2018 1007   LEUKOCYTESUR NEGATIVE 08/16/2018 1007   LEUKOCYTESUR Negative 02/17/2014 2043    Radiological Exams on Admission: CT Angio Chest PE W and/or Wo Contrast  Result Date: 10/20/2021 CLINICAL DATA:  Concern for pulmonary embolism. EXAM: CT ANGIOGRAPHY CHEST WITH CONTRAST TECHNIQUE: Multidetector CT imaging of the chest was performed using the standard protocol during bolus administration of intravenous contrast. Multiplanar CT image reconstructions and MIPs were obtained to evaluate the vascular anatomy. RADIATION DOSE REDUCTION: This exam was performed according to the departmental dose-optimization program which includes automated exposure control, adjustment of the mA and/or kV according to patient size and/or use of iterative reconstruction technique. CONTRAST:  70m OMNIPAQUE IOHEXOL 350 MG/ML SOLN COMPARISON:  Chest CT dated 09/21/2017 and radiograph dated 10/19/2021. FINDINGS: Cardiovascular: There is no cardiomegaly or pericardial effusion. Coronary vascular calcification. Moderate atherosclerotic calcification of the thoracic aorta. No aneurysmal dilatation or dissection. The origins of the great  vessels of the aortic arch appear patent as visualized. No pulmonary artery embolus identified. Mediastinum/Nodes: No hilar or mediastinal adenopathy. The esophagus is slightly patulous otherwise grossly unremarkable. No mediastinal fluid collection. Lungs/Pleura: Biapical subpleural and bibasilar scarring. No new consolidation. There is no pleural effusion pneumothorax. The central airways are patent. Upper Abdomen: Small calcified liver granuloma. Mild fullness of the renal collecting systems. Scattered colonic diverticula. No acute findings. Musculoskeletal:  Osteopenia with degenerative changes of the spine. No acute osseous pathology. Review of the MIP images confirms the above findings. IMPRESSION: 1. No acute intrathoracic pathology. No CT evidence of pulmonary artery embolus. 2. Aortic Atherosclerosis (ICD10-I70.0). Electronically Signed   By: Anner Crete M.D.   On: 10/20/2021 00:13   DG Chest Portable 1 View  Result Date: 10/19/2021 CLINICAL DATA:  Chest pain. EXAM: PORTABLE CHEST 1 VIEW COMPARISON:  12/28/2017, CT 09/21/2017 FINDINGS: The cardiomediastinal contours are normal. Atherosclerosis of the aortic arch. The lungs are clear. Pulmonary vasculature is normal. No consolidation, pleural effusion, or pneumothorax. No acute osseous abnormalities are seen. IMPRESSION: No acute chest findings. Electronically Signed   By: Keith Rake M.D.   On: 10/19/2021 22:55     Data Reviewed: Relevant notes from primary care and specialist visits, past discharge summaries as available in EHR, including Care Everywhere. Prior diagnostic testing as pertinent to current admission diagnoses Updated medications and problem lists for reconciliation ED course, including vitals, labs, imaging, treatment and response to treatment Triage notes, nursing and pharmacy notes and ED provider's notes Notable results as noted in HPI   Assessment and Plan: * Angina pectoris ([place]) CAD s/p DES to RCA 2019 Took  full dose aspirin at home No further episodes since arrival Continue to trend troponin Continue daily aspirin, atorvastatin.  Not currently on beta-blocker for unknown reason Sublingual nitroglycerin as needed chest pain with morphine for breakthrough Cardiology consult to decide on further risk stratification We will keep n.p.o. in case of procedure  History of CVA (cerebrovascular accident) Continue aspirin and atorvastatin  Underweight BMI 17.75.  Can consider nutritionist eval  GERD (gastroesophageal reflux disease) Continue pantoprazole  Essential hypertension Blood pressure was borderline, likely related to nitroglycerin Hold amlodipine        DVT prophylaxis: Lovenox  Consults: Cardiology, Dr. Rockey Situ  Advance Care Planning:   Code Status: Prior   Family Communication: none  Disposition Plan: Back to previous home environment  Severity of Illness: The appropriate patient status for this patient is OBSERVATION. Observation status is judged to be reasonable and necessary in order to provide the required intensity of service to ensure the patient's safety. The patient's presenting symptoms, physical exam findings, and initial radiographic and laboratory data in the context of their medical condition is felt to place them at decreased risk for further clinical deterioration. Furthermore, it is anticipated that the patient will be medically stable for discharge from the hospital within 2 midnights of admission.   Author: Athena Masse, MD 10/20/2021 2:19 AM  For on call review [email or web address].

## 2021-10-20 NOTE — ED Notes (Signed)
Pt ambulates without assistance around unit, denies chest pain or shortness of breath.

## 2021-10-20 NOTE — Assessment & Plan Note (Signed)
BMI 17.75.  Can consider nutritionist eval

## 2021-10-20 NOTE — Assessment & Plan Note (Signed)
Continue aspirin and atorvastatin. ?

## 2021-10-20 NOTE — ED Notes (Signed)
Pt assisted to the bathroom with steady gait.  

## 2021-10-22 LAB — LIPOPROTEIN A (LPA): Lipoprotein (a): 27.5 nmol/L (ref <75.0)

## 2021-10-24 ENCOUNTER — Telehealth: Payer: Self-pay | Admitting: Cardiovascular Disease

## 2021-10-24 DIAGNOSIS — R079 Chest pain, unspecified: Secondary | ICD-10-CM

## 2021-10-24 NOTE — Progress Notes (Signed)
Cardiology Office Note:    Date:  10/25/2021   ID:  Karen Liu, DOB Dec 20, 1930, MRN [id number]  PCP:  Baxter Hire, MD  [facility] Cardiologist:  Kathlyn Sacramento, MD  [place] Electrophysiologist:  None   Referring MD: Baxter Hire, MD   Chief Complaint: Hospital follow-up  History of Present Illness:    Karen Liu is a 88 y.o. female with a hx of CAD s/p RCA stenting in 2019, stroke 2012, GERD, HTN, and HLD who presents for chest pain.  Recently admitted for angina. Troponin was negative. EKG nonacute. Chest CTA with no PE. Plan was for outpatient work-up.   Today, the patient reports she is doing OK. She is not wanting to undergo Leane Call, she had a bad experience with this in 2016. The patient feels her chest pain was GERD. She has very bad acid-reflux and stopped her medications. She has had acid-reflux pain in the past that felt like a heart attack. She is back taking Protonix and Pepcid. She denies further chest pain. No exertional symptoms. She has generalized weakness from low nutrition. She is taking the Ensure.   Past Medical History:  Diagnosis Date   Arthritis    neck   Brain tumor ([place])    CAD (coronary artery disease)    a. LHC 7/16: 50% mLAD not sig by FFR (0.9), mild LCx/RCA dz,EF & LVEDP nl; b. LHC 7/19: mLAD 40%, mLCx 30%, ostRCA 30%, mRCA 99% (3.0x26 Resolute DES), nl LVSF, mildly elevated LVEDP.   Cerebellar hemorrhage ([place]) 01/2011   LEFT   Essential hypertension    GERD (gastroesophageal reflux disease)    History of CVA (cerebrovascular accident) 01/02/2011   Overview:  Left   History of kidney stones    Hyperlipidemia    Hypertension    Motion sickness    cars   PONV (postoperative nausea and vomiting)    Vertigo    before brain surgery    Past Surgical History:  Procedure Laterality Date   ABDOMINAL HYSTERECTOMY     APPENDECTOMY     BRAIN SURGERY  brain tumor removed 2013   CARDIAC CATHETERIZATION N/A 09/21/2014    Procedure: Left Heart Cath and Coronary Angiography;  Surgeon: Wellington Hampshire, MD;  Location: [place] [facility];  Service: Cardiovascular;  Laterality: N/A;   CATARACT EXTRACTION W/ INTRAOCULAR LENS  IMPLANT, BILATERAL     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   RESECTION CEREBELLAR MENINGIOMA   COLONOSCOPY     CORONARY STENT INTERVENTION N/A 09/21/2017   Procedure: CORONARY STENT INTERVENTION;  Surgeon: Wellington Hampshire, MD;  Location: [place] [facility];  Service: Cardiovascular;  Laterality: N/A;   ESOPHAGOGASTRODUODENOSCOPY     ESOPHAGOGASTRODUODENOSCOPY (EGD) WITH PROPOFOL N/A 11/13/2014   Procedure: ESOPHAGOGASTRODUODENOSCOPY (EGD) WITH PROPOFOL;  Surgeon: Manya Silvas, MD;  Location: [facility] END[facility]COPY;  Service: Endoscopy;  Laterality: N/A;   LEFT HEART CATH Right 09/21/2017   Procedure: Left Heart Cath;  Surgeon: Wellington Hampshire, MD;  Location: [place] [facility];  Service: Cardiovascular;  Laterality: Right;   MICROLARYNGOSCOPY N/A 11/22/2014   Procedure: MICROLARYNGOSCOPY DIRECT WITH REMOVAL EPIGLOTTIC CYST;  Surgeon: Beverly Gust, MD;  Location: [facility];  Service: ENT;  Laterality: N/A;    Current Medications: Current Meds  Medication Sig   amLODipine (NORVASC) 2.5 MG tablet Take 1 tablet (2.5 mg total) by mouth daily.   aspirin EC 81 MG tablet Take 1 tablet by mouth every morning.   atorvastatin (LIPITOR)  40 MG tablet Take 1 tablet (40 mg total) by mouth daily at 6 PM.   calcium-vitamin D (OSCAL WITH D) 500-200 MG-UNIT tablet Take 2 tablets by mouth daily with breakfast.   cetirizine (ZYRTEC) 10 MG chewable tablet Chew 10 mg by mouth daily as needed.    famotidine (PEPCID) 40 MG tablet Take 40 mg by mouth daily.   fluticasone (FLONASE) 50 MCG/ACT nasal spray Place 2 sprays into the nose 2 (two) times daily as needed.   nitroGLYCERIN (NITROSTAT) 0.4 MG SL tablet Place 1 tablet (0.4 mg total) under the tongue every 5 (five) minutes as needed for chest pain.    pantoprazole (PROTONIX) 40 MG tablet Take 40 mg by mouth daily.   potassium citrate (UROCIT-K) 10 MEQ (1080 MG) SR tablet Take 1 tablet (10 mEq total) by mouth 2 (two) times daily.   vitamin B-12 (CYANOCOBALAMIN) 500 MCG tablet Take 1,000 mcg by mouth every morning.      Allergies:   Regadenoson, Other, Phenazopyridine, Pyridium [phenazopyridine hcl], Sulfa antibiotics, Sulfacetamide sodium, and Tamsulosin   Social History   Socioeconomic History   Marital status: Widowed    Spouse name: Not on file   Number of children: Not on file   Years of education: Not on file   Highest education level: Not on file  Occupational History   Not on file  Tobacco Use   Smoking status: Never   Smokeless tobacco: Never  Vaping Use   Vaping Use: Never used  Substance and Sexual Activity   Alcohol use: No   Drug use: No   Sexual activity: Not on file  Other Topics Concern   Not on file  Social History Narrative   Not on file   Social Determinants of Health   Financial Resource Strain: Not on file  Food Insecurity: Not on file  Transportation Needs: Not on file  Physical Activity: Not on file  Stress: Not on file  Social Connections: Not on file     Family History: The patient's family history includes Heart attack (age of onset: 48) in her mother; Heart disease in her mother; Hyperlipidemia in her mother; Hypertension in her mother. There is no history of Breast cancer.  ROS:   Please see the history of present illness.     All other systems reviewed and are negative.  EKGs/Labs/Other Studies Reviewed:    The following studies were reviewed today:   LHC 2019 Mid Cx lesion is 30% stenosed. The left ventricular systolic function is normal. LV end diastolic pressure is mildly elevated. The left ventricular ejection fraction is 55-65% by visual estimate. Mid LAD lesion is 40% stenosed. Mid RCA lesion is 99% stenosed. Post intervention, there is a 0% residual stenosis. A  drug-eluting stent was successfully placed using a STENT RESOLUTE ONYX 3.0X26. Ost RCA lesion is 30% stenosed.   1.  Severe one-vessel coronary artery disease with 99% stenosis in the mid right coronary artery which seems to be the culprit for unstable angina.  Moderate mid LAD stenosis appears to be stable since 2016. 2.  Normal LV systolic function and mildly elevated left ventricular end-diastolic pressure. 3.  Successful angioplasty and drug-eluting stent placement to the right coronary artery.     Recommendations: Recommend uninterrupted dual antiplatelet therapy with Aspirin '81mg'$  daily and Clopidogrel '75mg'$  daily for a minimum of 12 months (ACS - Class I recommendation).    Aggressive treatment of risk factors.  I switched pravastatin to atorvastatin.  Myoview Lexiscan 2016 Study Result  Narrative & Impression  ST segment depression was noted during stress in the II, III, V3, V4, V5 and V6 leads, beginning at 2 minutes of stress, ending at 25 minutes of stress. The left ventricular ejection fraction is normal (55-65%). This is an intermediate risk study. normal stress perfusion. However, the patient had significant global ST depression with Lexiscan ( more than 2 mm in the inferior leads). This raises suspicion for balanced ischemia due to three-vessel coronary artery disease.      EKG:  EKG is ordered today.  The ekg ordered today demonstrates NSR 78bpm, nonspecific T wave changes.  Recent Labs: 10/19/2021: ALT 18; BUN 32; Creatinine, Ser 0.86; Hemoglobin 11.0; Platelets 208; Potassium 3.4; Sodium 142  Recent Lipid Panel    Component Value Date/Time   CHOL 129 12/23/2017 0850   TRIG 52 12/23/2017 0850   HDL 56 12/23/2017 0850   CHOLHDL 2.3 12/23/2017 0850   LDLCALC 63 12/23/2017 0850   LDLDIRECT 80 11/11/2017 1156    Physical Exam:    VS:  BP 124/60 (BP Location: Left Arm, Patient Position: Sitting, Cuff Size: Normal)   Pulse 78   Ht '5\' 6"'$  (1.676 m)   Wt 110 lb (49.9  kg)   SpO2 96%   BMI 17.75 kg/m     Wt Readings from Last 3 Encounters:  10/25/21 110 lb (49.9 kg)  10/19/21 110 lb (49.9 kg)  04/09/21 116 lb 6 oz (52.8 kg)     GEN:  Well nourished, well developed in no acute distress HEENT: Normal NECK: No JVD; No carotid bruits LYMPHATICS: No lymphadenopathy CARDIAC: RRR, no murmurs, rubs, gallops RESPIRATORY:  Clear to auscultation without rales, wheezing or rhonchi  ABDOMEN: Soft, non-tender, non-distended MUSCULOSKELETAL:  No edema; No deformity  SKIN: Warm and dry NEUROLOGIC:  Alert and oriented x 3 PSYCHIATRIC:  Normal affect   ASSESSMENT:    1. Chest pain, unspecified type   2. Coronary artery disease involving native coronary artery of native heart without angina pectoris   3. Essential hypertension   4. Hyperlipidemia, mixed   5. Gastroesophageal reflux disease with esophagitis, unspecified whether hemorrhage    PLAN:    In order of problems listed above:  Chest pain CAD with prior RCA stenting 2019 Patient recently admitted for chest pain with negative cardiac work-up. It's suspected chest pain was from acid-reflux since patient stopped taking her medications. She is back taking Protonix and Pepcid. She denies recurrent chest pain. She does not want to under Myoview Lexiscan due to adverse side affects.  We will order an echocardiogram. Continue Aspirin and statin therapy. No bb due to side effects.  HTN BP is good today, continue amlodipine.   HLD LDL 71. Continue statin therapy.    GERD Patient started taking Protonix and Pepcid again.   Disposition: Follow up in 6 month(s) with MD/APP   Signed, Razi Hickle Ninfa Meeker, PA-C  10/25/2021 8:57 AM    [facility]

## 2021-10-24 NOTE — Telephone Encounter (Signed)
Karen Merritts, MD  [facility] Triage; Wellington Hampshire, MD Seen in the hospital Sunday, October 20, 2021 for chest pain  Known coronary artery disease, some typical and atypical features  Can we arrange a Myoview as outpatient and following Myoview follow-up in our clinic with PA or Dr. Fletcher Anon to go over the results  THX  TG

## 2021-10-24 NOTE — Telephone Encounter (Signed)
Order placed for myoview. Msg fwd to scheduling to schedule the lexiscan and f/u appt.     [place]  Your caregiver has ordered a Stress Test with nuclear imaging. The purpose of this test is to evaluate the blood supply to your heart muscle. This procedure is referred to as a "Non-Invasive Stress Test." This is because other than having an IV started in your vein, nothing is inserted or "invades" your body. Cardiac stress tests are done to find areas of poor blood flow to the heart by determining the extent of coronary artery disease (CAD). Some patients exercise on a treadmill, which naturally increases the blood flow to your heart, while others who are  unable to walk on a treadmill due to physical limitations have a pharmacologic/chemical stress agent called Lexiscan . This medicine will mimic walking on a treadmill by temporarily increasing your coronary blood flow.   Please note: these test may take anywhere between 2-4 hours to complete  PLEASE REPORT TO [place] AT THE FIRST DESK WILL DIRECT YOU WHERE TO GO  Date of Procedure:_____________________________________  Arrival Time for Procedure:______________________________   PLEASE NOTIFY THE OFFICE AT LEAST 24 HOURS IN ADVANCE IF YOU ARE UNABLE TO KEEP YOUR APPOINTMENT.  [phone] AND  PLEASE NOTIFY NUCLEAR MEDICINE AT [facility] AT LEAST 24 HOURS IN ADVANCE IF YOU ARE UNABLE TO KEEP YOUR APPOINTMENT. [phone]  How to prepare for your Myoview test:  Do not eat or drink after midnight No caffeine for 24 hours prior to test No smoking 24 hours prior to test. Your medication may be taken with water.  If your doctor stopped a medication because of this test, do not take that medication. Ladies, please do not wear dresses.  Skirts or pants are appropriate. Please wear a short sleeve shirt. No perfume or lotion. Wear comfortable walking shoes. No heels!

## 2021-10-25 ENCOUNTER — Encounter: Payer: Self-pay | Admitting: Medical

## 2021-10-25 ENCOUNTER — Ambulatory Visit (INDEPENDENT_AMBULATORY_CARE_PROVIDER_SITE_OTHER): Payer: Medicare Other | Admitting: Medical

## 2021-10-25 VITALS — BP 124/60 | HR 78 | Ht 66.0 in | Wt 110.0 lb

## 2021-10-25 DIAGNOSIS — I2510 Atherosclerotic heart disease of native coronary artery without angina pectoris: Secondary | ICD-10-CM | POA: Diagnosis not present

## 2021-10-25 DIAGNOSIS — E782 Mixed hyperlipidemia: Secondary | ICD-10-CM | POA: Diagnosis not present

## 2021-10-25 DIAGNOSIS — I10 Essential (primary) hypertension: Secondary | ICD-10-CM | POA: Diagnosis not present

## 2021-10-25 DIAGNOSIS — R079 Chest pain, unspecified: Secondary | ICD-10-CM | POA: Diagnosis not present

## 2021-10-25 DIAGNOSIS — K2100 Gastro-esophageal reflux disease with esophagitis, without bleeding: Secondary | ICD-10-CM

## 2021-10-25 NOTE — Patient Instructions (Signed)
Medication Instructions:  Your physician recommends that you continue on your current medications as directed. Please refer to the Current Medication list given to you today.   *If you need a refill on your cardiac medications before your next appointment, please call your pharmacy*   Lab Work: None ordered  If you have labs (blood work) drawn today and your tests are completely normal, you will receive your results only by: Karen Liu (if you have MyChart) OR A paper copy in the mail If you have any lab test that is abnormal or we need to change your treatment, we will call you to review the results.   Testing/Procedures: Your physician has requested that you have an echocardiogram. Echocardiography is a painless test that uses sound waves to create images of your heart. It provides your doctor with information about the size and shape of your heart and how well your heart's chambers and valves are working. This procedure takes approximately one hour. There are no restrictions for this procedure.  Follow-Up: At [facility], you and your health needs are our priority.  As part of our continuing mission to provide you with exceptional heart care, we have created designated Provider Care Teams.  These Care Teams include your primary Cardiologist (physician) and Advanced Practice Providers (APPs -  Physician Assistants and Nurse Practitioners) who all work together to provide you with the care you need, when you need it.  We recommend signing up for the patient portal called "MyChart".  Sign up information is provided on this After Visit Summary.  MyChart is used to connect with patients for Virtual Visits (Telemedicine).  Patients are able to view lab/test results, encounter notes, upcoming appointments, etc.  Non-urgent messages can be sent to your provider as well.   To learn more about what you can do with MyChart, go to [email or web address].    Your next appointment:   6  month(s)  The format for your next appointment:   In Person  Provider:   You may see Kathlyn Sacramento, MD or one of the following Advanced Practice Providers on your designated Care Team:   Murray Hodgkins, NP Christell Faith, PA-C Cadence Kathlen Mody, PA-C{  Important Information About Sugar

## 2021-10-30 ENCOUNTER — Other Ambulatory Visit: Payer: Self-pay | Admitting: Cardiovascular Disease

## 2021-11-13 ENCOUNTER — Ambulatory Visit: Payer: Medicare Other | Attending: Medical

## 2021-11-13 DIAGNOSIS — R079 Chest pain, unspecified: Secondary | ICD-10-CM | POA: Diagnosis present

## 2021-11-13 LAB — ECHOCARDIOGRAM COMPLETE
AR max vel: 2.27 cm2
AV Area VTI: 2.3 cm2
AV Area mean vel: 2.31 cm2
AV Mean grad: 4 mmHg
AV Peak grad: 6.3 mmHg
Ao pk vel: 1.25 m/s
Area-P 1/2: 3.95 cm2
Calc EF: 60.2 %
S' Lateral: 3 cm
Single Plane A2C EF: 56.4 %
Single Plane A4C EF: 61.2 %

## 2021-12-03 ENCOUNTER — Other Ambulatory Visit: Payer: Self-pay | Admitting: Internal Medicine

## 2021-12-03 DIAGNOSIS — Z1231 Encounter for screening mammogram for malignant neoplasm of breast: Secondary | ICD-10-CM

## 2021-12-31 ENCOUNTER — Ambulatory Visit
Admission: RE | Admit: 2021-12-31 | Discharge: 2021-12-31 | Disposition: A | Discharge status: Home / Self Care | Payer: Medicare Other | Source: Ambulatory Visit | Attending: Internal Medicine | Admitting: Internal Medicine

## 2021-12-31 DIAGNOSIS — Z1231 Encounter for screening mammogram for malignant neoplasm of breast: Secondary | ICD-10-CM | POA: Insufficient documentation

## 2022-01-06 ENCOUNTER — Other Ambulatory Visit: Payer: Self-pay | Admitting: Internal Medicine

## 2022-01-06 DIAGNOSIS — R928 Other abnormal and inconclusive findings on diagnostic imaging of breast: Secondary | ICD-10-CM

## 2022-01-06 DIAGNOSIS — R921 Mammographic calcification found on diagnostic imaging of breast: Secondary | ICD-10-CM

## 2022-01-17 ENCOUNTER — Ambulatory Visit
Admission: RE | Admit: 2022-01-17 | Discharge: 2022-01-17 | Disposition: A | Discharge status: Home / Self Care | Payer: Medicare Other | Source: Ambulatory Visit | Attending: Internal Medicine | Admitting: Internal Medicine

## 2022-01-17 DIAGNOSIS — R928 Other abnormal and inconclusive findings on diagnostic imaging of breast: Secondary | ICD-10-CM | POA: Diagnosis present

## 2022-01-17 DIAGNOSIS — R921 Mammographic calcification found on diagnostic imaging of breast: Secondary | ICD-10-CM | POA: Insufficient documentation

## 2022-01-27 ENCOUNTER — Other Ambulatory Visit: Payer: Self-pay | Admitting: Internal Medicine

## 2022-01-27 DIAGNOSIS — R921 Mammographic calcification found on diagnostic imaging of breast: Secondary | ICD-10-CM

## 2022-01-27 DIAGNOSIS — R928 Other abnormal and inconclusive findings on diagnostic imaging of breast: Secondary | ICD-10-CM

## 2022-02-07 ENCOUNTER — Other Ambulatory Visit: Payer: Self-pay | Admitting: Internal Medicine

## 2022-02-07 ENCOUNTER — Ambulatory Visit
Admission: RE | Admit: 2022-02-07 | Discharge: 2022-02-07 | Disposition: A | Discharge status: Home / Self Care | Payer: Medicare Other | Source: Ambulatory Visit | Attending: Internal Medicine | Admitting: Internal Medicine

## 2022-02-07 DIAGNOSIS — R921 Mammographic calcification found on diagnostic imaging of breast: Secondary | ICD-10-CM

## 2022-02-07 DIAGNOSIS — R928 Other abnormal and inconclusive findings on diagnostic imaging of breast: Secondary | ICD-10-CM

## 2022-02-07 HISTORY — PX: BREAST BIOPSY: SHX20

## 2022-02-07 MED ORDER — LIDOCAINE-EPINEPHRINE 1 %-1:100000 IJ SOLN
10.0000 mL | Freq: Once | INTRAMUSCULAR | Status: AC
  Administered 2022-02-07: 10 mL
  Filled 2022-02-07: qty 10

## 2022-02-07 MED ORDER — LIDOCAINE HCL (PF) 1 % IJ SOLN
20.0000 mL | Freq: Once | INTRAMUSCULAR | Status: AC
  Administered 2022-02-07: 20 mL
  Filled 2022-02-07: qty 20

## 2022-02-10 LAB — SURGICAL PATHOLOGY

## 2022-02-12 ENCOUNTER — Other Ambulatory Visit: Payer: Self-pay | Admitting: Internal Medicine

## 2022-02-12 DIAGNOSIS — R921 Mammographic calcification found on diagnostic imaging of breast: Secondary | ICD-10-CM

## 2022-02-12 DIAGNOSIS — R928 Other abnormal and inconclusive findings on diagnostic imaging of breast: Secondary | ICD-10-CM

## 2022-03-08 ENCOUNTER — Other Ambulatory Visit: Payer: Self-pay

## 2022-03-08 ENCOUNTER — Emergency Department: Payer: Medicare Other

## 2022-03-08 DIAGNOSIS — R531 Weakness: Secondary | ICD-10-CM | POA: Insufficient documentation

## 2022-03-08 DIAGNOSIS — Z7982 Long term (current) use of aspirin: Secondary | ICD-10-CM | POA: Insufficient documentation

## 2022-03-08 DIAGNOSIS — I10 Essential (primary) hypertension: Secondary | ICD-10-CM | POA: Diagnosis not present

## 2022-03-08 DIAGNOSIS — Z955 Presence of coronary angioplasty implant and graft: Secondary | ICD-10-CM | POA: Insufficient documentation

## 2022-03-08 DIAGNOSIS — Z20822 Contact with and (suspected) exposure to covid-19: Secondary | ICD-10-CM | POA: Insufficient documentation

## 2022-03-08 DIAGNOSIS — I2510 Atherosclerotic heart disease of native coronary artery without angina pectoris: Secondary | ICD-10-CM | POA: Diagnosis not present

## 2022-03-08 DIAGNOSIS — Z79899 Other long term (current) drug therapy: Secondary | ICD-10-CM | POA: Insufficient documentation

## 2022-03-08 LAB — CBC WITH DIFFERENTIAL/PLATELET
Abs Immature Granulocytes: 0.06 10*3/uL (ref 0.00–0.07)
Basophils Absolute: 0 10*3/uL (ref 0.0–0.1)
Basophils Relative: 0 %
Eosinophils Absolute: 0 10*3/uL (ref 0.0–0.5)
Eosinophils Relative: 0 %
HCT: 39.1 % (ref 36.0–46.0)
Hemoglobin: 12.9 g/dL (ref 12.0–15.0)
Immature Granulocytes: 1 %
Lymphocytes Relative: 14 %
Lymphs Abs: 1.4 10*3/uL (ref 0.7–4.0)
MCH: 30.7 pg (ref 26.0–34.0)
MCHC: 33 g/dL (ref 30.0–36.0)
MCV: 93.1 fL (ref 80.0–100.0)
Monocytes Absolute: 1.6 10*3/uL — ABNORMAL HIGH (ref 0.1–1.0)
Monocytes Relative: 15 %
Neutro Abs: 7.2 10*3/uL (ref 1.7–7.7)
Neutrophils Relative %: 70 %
Platelets: 386 10*3/uL (ref 150–400)
RBC: 4.2 MIL/uL (ref 3.87–5.11)
RDW: 11.7 % (ref 11.5–15.5)
WBC: 10.3 10*3/uL (ref 4.0–10.5)
nRBC: 0 % (ref 0.0–0.2)

## 2022-03-08 LAB — COMPREHENSIVE METABOLIC PANEL
ALT: 23 U/L (ref 0–44)
AST: 30 U/L (ref 15–41)
Albumin: 3.6 g/dL (ref 3.5–5.0)
Alkaline Phosphatase: 75 U/L (ref 38–126)
Anion gap: 12 (ref 5–15)
BUN: 24 mg/dL — ABNORMAL HIGH (ref 8–23)
CO2: 28 mmol/L (ref 22–32)
Calcium: 9 mg/dL (ref 8.9–10.3)
Chloride: 97 mmol/L — ABNORMAL LOW (ref 98–111)
Creatinine, Ser: 0.92 mg/dL (ref 0.44–1.00)
GFR, Estimated: 60 mL/min — ABNORMAL LOW (ref >60)
Glucose, Bld: 131 mg/dL — ABNORMAL HIGH (ref 70–99)
Potassium: 3.4 mmol/L — ABNORMAL LOW (ref 3.5–5.1)
Sodium: 137 mmol/L (ref 135–145)
Total Bilirubin: 1.7 mg/dL — ABNORMAL HIGH (ref 0.3–1.2)
Total Protein: 7.7 g/dL (ref 6.5–8.1)

## 2022-03-08 LAB — RESP PANEL BY RT-PCR (RSV, FLU A&B, COVID)  RVPGX2
Influenza A by PCR: NEGATIVE
Influenza B by PCR: NEGATIVE
Resp Syncytial Virus by PCR: NEGATIVE
SARS Coronavirus 2 by RT PCR: NEGATIVE

## 2022-03-08 NOTE — ED Triage Notes (Signed)
Pt arrives via EMS with CC of weakness that began 3 weeks prior to 02/24/2022. Pt reports lack of appetite and weakness. Pt did fall and hit head about 2 weeks ago and was not seen for treatment - denies taking coumadin or warfarin. Denies SOB, blurred vision, and dizziness at this time. Alert and oriented at this time.

## 2022-03-08 NOTE — ED Notes (Signed)
First Nurse Note: Pt arrives via EMS from home requesting a COVID test due to being exposed to someone who is positive.   VSS with EMS  98% on RA 77 HR

## 2022-03-09 ENCOUNTER — Emergency Department
Admission: EM | Admit: 2022-03-09 | Discharge: 2022-03-09 | Disposition: A | Discharge status: Home / Self Care | Payer: Medicare Other | Attending: Emergency Medicine | Admitting: Emergency Medicine

## 2022-03-09 DIAGNOSIS — R531 Weakness: Secondary | ICD-10-CM

## 2022-03-09 LAB — TROPONIN I (HIGH SENSITIVITY): Troponin I (High Sensitivity): 8 ng/L (ref <18)

## 2022-03-09 LAB — URINALYSIS, ROUTINE W REFLEX MICROSCOPIC
Bacteria, UA: NONE SEEN
Bilirubin Urine: NEGATIVE
Glucose, UA: NEGATIVE mg/dL
Ketones, ur: NEGATIVE mg/dL
Nitrite: NEGATIVE
Protein, ur: 30 mg/dL — AB
Specific Gravity, Urine: 1.015 (ref 1.005–1.030)
pH: 5 (ref 5.0–8.0)

## 2022-03-09 NOTE — ED Notes (Signed)
Pt ambulated to BR, voided but missed hat, not enough urine to collect specimen

## 2022-03-09 NOTE — ED Provider Notes (Signed)
[facility] Provider Note    Event Date/Time   First MD Initiated Contact with Patient 03/09/22 [phone]     (approximate)   History   Weakness   HPI  Karen Liu is a 89 y.o. female with history of CAD, hypertension, hyperlipidemia who presents to the emergency department generalized weakness that has been ongoing since before Christmas.  States she had COVID before Christmas and has been weak ever since.  No chest pain, shortness of breath, fever, cough, abdominal pain, vomiting, diarrhea, numbness, tingling, focal weakness, head injury.  She states she had a fall around Christmas time but did not come in and have any head or neck imaging.  Is complaining of left-sided neck pain.  Able to ambulate.   History provided by patient.    Past Medical History:  Diagnosis Date   Arthritis    neck   Brain tumor ([place])    CAD (coronary artery disease)    a. LHC 7/16: 50% mLAD not sig by FFR (0.9), mild LCx/RCA dz,EF & LVEDP nl; b. LHC 7/19: mLAD 40%, mLCx 30%, ostRCA 30%, mRCA 99% (3.0x26 Resolute DES), nl LVSF, mildly elevated LVEDP.   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   Essential hypertension    GERD (gastroesophageal reflux disease)    History of CVA (cerebrovascular accident) 01/02/2011   Overview:  Left   History of kidney stones    Hyperlipidemia    Hypertension    Motion sickness    cars   PONV (postoperative nausea and vomiting)    Vertigo    before brain surgery    Past Surgical History:  Procedure Laterality Date   ABDOMINAL HYSTERECTOMY     APPENDECTOMY     BRAIN SURGERY  brain tumor removed 2013   BREAST BIOPSY Left 02/07/2022   Left Breast Stereo Bx, Coil Clip- path pending   BREAST BIOPSY Right 02/07/2022   MM RT BREAST BX W LOC DEV 1ST LESION IMAGE BX SPEC STEREO GUIDE 02/07/2022 [facility]   CARDIAC CATHETERIZATION N/A 09/21/2014   Procedure: Left Heart Cath and Coronary Angiography;  Surgeon: Wellington Hampshire, MD;   Location: [place] [facility];  Service: Cardiovascular;  Laterality: N/A;   CATARACT EXTRACTION W/ INTRAOCULAR LENS  IMPLANT, BILATERAL     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   RESECTION CEREBELLAR MENINGIOMA   COLONOSCOPY     CORONARY STENT INTERVENTION N/A 09/21/2017   Procedure: CORONARY STENT INTERVENTION;  Surgeon: Wellington Hampshire, MD;  Location: [place] [facility];  Service: Cardiovascular;  Laterality: N/A;   ESOPHAGOGASTRODUODENOSCOPY     ESOPHAGOGASTRODUODENOSCOPY (EGD) WITH PROPOFOL N/A 11/13/2014   Procedure: ESOPHAGOGASTRODUODENOSCOPY (EGD) WITH PROPOFOL;  Surgeon: Manya Silvas, MD;  Location: [facility] ENDOSCOPY;  Service: Endoscopy;  Laterality: N/A;   LEFT HEART CATH Right 09/21/2017   Procedure: Left Heart Cath;  Surgeon: Wellington Hampshire, MD;  Location: [place] [facility];  Service: Cardiovascular;  Laterality: Right;   MICROLARYNGOSCOPY N/A 11/22/2014   Procedure: MICROLARYNGOSCOPY DIRECT WITH REMOVAL EPIGLOTTIC CYST;  Surgeon: Beverly Gust, MD;  Location: [place];  Service: ENT;  Laterality: N/A;    MEDICATIONS:  Prior to Admission medications   Medication Sig Start Date End Date Taking? Authorizing Provider  amLODipine (NORVASC) 2.5 MG tablet Take 1 tablet (2.5 mg total) by mouth daily. 04/09/21   Wellington Hampshire, MD  aspirin EC 81 MG tablet Take 1 tablet by mouth every morning.    [provider]  atorvastatin (  LIPITOR) 40 MG tablet TAKE 1 TABLET BY MOUTH DAILY AT 6 PM. 10/30/21   Wellington Hampshire, MD  calcium-vitamin D (OSCAL WITH D) 500-200 MG-UNIT tablet Take 2 tablets by mouth daily with breakfast.    [provider]  cetirizine (ZYRTEC) 10 MG chewable tablet Chew 10 mg by mouth daily as needed.     [provider]  famotidine (PEPCID) 40 MG tablet Take 40 mg by mouth daily. 12/02/19   [provider]  fluticasone (FLONASE) 50 MCG/ACT nasal spray Place 2 sprays into the nose 2 (two) times daily as needed.  08/02/20   [provider]  nitroGLYCERIN (NITROSTAT) 0.4 MG SL tablet Place 1 tablet (0.4 mg total) under the tongue every 5 (five) minutes as needed for chest pain. 09/22/17   Theora Gianotti, NP  pantoprazole (PROTONIX) 40 MG tablet Take 40 mg by mouth daily.    [provider]  potassium citrate (UROCIT-K) 10 MEQ (1080 MG) SR tablet Take 1 tablet (10 mEq total) by mouth 2 (two) times daily. 04/18/21   Stoioff, Ronda Fairly, MD  vitamin B-12 (CYANOCOBALAMIN) 500 MCG tablet Take 1,000 mcg by mouth every morning.  02/18/11   [provider]    Physical Exam   Triage Vital Signs: ED Triage Vitals  Enc Vitals Group     BP 03/08/22 2015 126/62     Pulse Rate 03/08/22 2015 97     Resp 03/08/22 2015 (!) 21     Temp 03/08/22 2015 98.1 F (36.7 C)     Temp Source 03/08/22 2015 Oral     SpO2 03/08/22 2015 97 %     Weight 03/08/22 2016 120 lb (54.4 kg)     Height 03/08/22 2016 '5\' 6"'$  (1.676 m)     Head Circumference --      Peak Flow --      Pain Score 03/08/22 2016 0     Pain Loc --      Pain Edu? --      Excl. in [place]? --     Most recent vital signs: Vitals:   03/08/22 2332 03/09/22 0414  BP: 100/76 (!) 112/51  Pulse: 99 85  Resp: 19 20  Temp: 99.1 F (37.3 C)   SpO2: 98% 94%    CONSTITUTIONAL: Alert and oriented and responds appropriately to questions. Well-appearing; well-nourished HEAD: Normocephalic, atraumatic EYES: Conjunctivae clear, pupils appear equal, sclera nonicteric ENT: normal nose; moist mucous membranes NECK: Supple, normal ROM CARD: RRR; S1 and S2 appreciated; no murmurs, no clicks, no rubs, no gallops RESP: Normal chest excursion without splinting or tachypnea; breath sounds clear and equal bilaterally; no wheezes, no rhonchi, no rales, no hypoxia or respiratory distress, speaking full sentences ABD/GI: Normal bowel sounds; non-distended; soft, non-tender, no rebound, no guarding, no peritoneal signs BACK: The back appears  normal EXT: Normal ROM in all joints; no deformity noted, no edema; no cyanosis SKIN: Normal color for age and race; warm; no rash on exposed skin NEURO: Moves all extremities equally, normal speech, normal sensation, no facial asymmetry, ambulates with slow and steady gait PSYCH: The patient's mood and manner are appropriate.   ED Results / Procedures / Treatments   LABS: (all labs ordered are listed, but only abnormal results are displayed) Labs Reviewed  URINALYSIS, ROUTINE W REFLEX MICROSCOPIC - Abnormal; Notable for the following components:      Result Value   Color, Urine YELLOW (*)    APPearance HAZY (*)    Hgb  urine dipstick SMALL (*)    Protein, ur 30 (*)    Leukocytes,Ua MODERATE (*)    All other components within normal limits  CBC WITH DIFFERENTIAL/PLATELET - Abnormal; Notable for the following components:   Monocytes Absolute 1.6 (*)    All other components within normal limits  COMPREHENSIVE METABOLIC PANEL - Abnormal; Notable for the following components:   Potassium 3.4 (*)    Chloride 97 (*)    Glucose, Bld 131 (*)    BUN 24 (*)    Total Bilirubin 1.7 (*)    GFR, Estimated 60 (*)    All other components within normal limits  RESP PANEL BY RT-PCR (RSV, FLU A&B, COVID)  [id number]  URINE CULTURE  TROPONIN I (HIGH SENSITIVITY)     EKG:  EKG Interpretation  Date/Time:  Sunday March 09 2022 03:56:53 EST Ventricular Rate:  91 PR Interval:  132 QRS Duration: 70 QT Interval:  350 QTC Calculation: 430 R Axis:   14 Text Interpretation: Normal sinus rhythm Septal infarct , age undetermined Abnormal ECG When compared with ECG of 19-Oct-2021 22:12, PREVIOUS ECG IS PRESENT Confirmed by Pryor Curia [phone]) on 03/09/2022 3:59:03 AM         RADIOLOGY: My personal review and interpretation of imaging: CT head and cervical spine showed no acute abnormality.  I have personally reviewed all radiology reports.   CT Soft Tissue Neck Wo Contrast  Result Date:  03/08/2022 CLINICAL DATA:  Weakness, brachial plexopathy, traumatic EXAM: CT NECK WITHOUT CONTRAST TECHNIQUE: Multidetector CT imaging of the neck was performed following the standard protocol without intravenous contrast. RADIATION DOSE REDUCTION: This exam was performed according to the departmental dose-optimization program which includes automated exposure control, adjustment of the mA and/or kV according to patient size and/or use of iterative reconstruction technique. COMPARISON:  None Available. FINDINGS: Evaluation is somewhat limited by beam hardening artifact from the patient's dental hardware. Pharynx and larynx: Normal. No mass or swelling. Salivary glands: No inflammation, mass, or stone. Thyroid: Normal. Lymph nodes: None enlarged or abnormal density. Vascular: Calcifications at the carotid bifurcations and in the intracranial vertebral and carotid arteries. Limited intracranial: Negative. Visualized orbits: No acute finding. Status post bilateral lens replacements. Mastoids and visualized paranasal sinuses: Near complete opacification of the right sphenoid sinus. Mild mucosal thickening in the ethmoid air cells. The mastoids are well aerated. Skeleton: Prior left suboccipital craniotomy. No acute osseous abnormality. Degenerative changes in the cervical spine. Upper chest: Biapical pleuroparenchymal scarring. Patulous esophagus. Other: None. IMPRESSION: No acute process in the neck. Electronically Signed   By: Merilyn Baba M.D.   On: 03/08/2022 21:51   CT HEAD WO CONTRAST (5MM)  Result Date: 03/08/2022 CLINICAL DATA:  Weakness. EXAM: CT HEAD WITHOUT CONTRAST TECHNIQUE: Contiguous axial images were obtained from the base of the skull through the vertex without intravenous contrast. RADIATION DOSE REDUCTION: This exam was performed according to the departmental dose-optimization program which includes automated exposure control, adjustment of the mA and/or kV according to patient size and/or use of  iterative reconstruction technique. COMPARISON:  September 11, 2014 FINDINGS: Brain: There is moderate severity cerebral atrophy with widening of the extra-axial spaces and ventricular dilatation. There are areas of decreased attenuation within the white matter tracts of the supratentorial brain, consistent with microvascular disease changes. Vascular: No hyperdense vessel or unexpected calcification. Skull: A left posterior fossa craniotomy defect is noted Sinuses/Orbits: There is marked severity right-sided sphenoid sinus mucosal thickening Other: None. IMPRESSION: 1. No acute intracranial abnormality. 2. Moderate  severity cerebral atrophy and microvascular disease changes of the supratentorial brain. 3. Right-sided sphenoid sinus disease. Electronically Signed   By: Virgina Norfolk M.D.   On: 03/08/2022 21:11     PROCEDURES:  Critical Care performed: No     Procedures    IMPRESSION / MDM / ASSESSMENT AND PLAN / ED COURSE  I reviewed the triage vital signs and the nursing notes.    Patient here with generalized weakness ongoing for several weeks.  No significant change.  Unclear what prompted her to come to the ED tonight.    DIFFERENTIAL DIAGNOSIS (includes but not limited to):   ACS, UTI, dehydration, anemia, electrolyte derangement, intracranial hemorrhage, doubt stroke, sepsis, meningitis   Patient's presentation is most consistent with acute presentation with potential threat to life or bodily function.   PLAN: Patient's labs show no leukocytosis, normal hemoglobin, normal electrolytes, renal function, LFTs.  COVID, flu and RSV negative.  Will add on troponin, EKG.  CT head and cervical spine showed no acute abnormality.  Will obtain urinalysis.   MEDICATIONS GIVEN IN ED: Medications - No data to display   ED COURSE: Patient able to ambulate here without difficulty or need for assistance.  Tolerating p.o.  Troponin negative.  EKG nonischemic.  Urine shows pyuria but no  bacteria or other sign of infection.  Culture pending.  Daughter will come to pick her up from the emergency department.  Recommended close PCP follow-up.   At this time, I do not feel there is any life-threatening condition present. I reviewed all nursing notes, vitals, pertinent previous records.  All lab and urine results, EKGs, imaging ordered have been independently reviewed and interpreted by myself.  I reviewed all available radiology reports from any imaging ordered this visit.  Based on my assessment, I feel the patient is safe to be discharged home without further emergent workup and can continue workup as an outpatient as needed. Discussed all findings, treatment plan as well as usual and customary return precautions.  They verbalize understanding and are comfortable with this plan.  Outpatient follow-up has been provided as needed.  All questions have been answered.   CONSULTS:  none   OUTSIDE RECORDS REVIEWED: Reviewed patient's last internal medicine visit with Meade Maw on 01/22/2022.       FINAL CLINICAL IMPRESSION(S) / ED DIAGNOSES   Final diagnoses:  Generalized weakness     Rx / DC Orders   ED Discharge Orders     None        Note:  This document was prepared using Dragon voice recognition software and may include unintentional dictation errors.   Gabriela Irigoyen, Delice Bison, DO 03/09/22 [phone]

## 2022-03-09 NOTE — ED Notes (Signed)
Pt's daughter called for safe dispo home. Daughter [name] to come pick up

## 2022-03-09 NOTE — ED Notes (Signed)
pt ambulated in hallway with steady gait, no difficulty reported

## 2022-03-09 NOTE — ED Notes (Signed)
Pt wheel chaired out by [employer]

## 2022-03-09 NOTE — ED Notes (Signed)
Water and graham crackers given

## 2022-03-09 NOTE — ED Notes (Signed)
Water given  

## 2022-03-10 LAB — URINE CULTURE: Culture: <10000 — AB

## 2022-03-27 ENCOUNTER — Other Ambulatory Visit: Payer: Self-pay | Admitting: Urology

## 2022-03-27 ENCOUNTER — Ambulatory Visit (INDEPENDENT_AMBULATORY_CARE_PROVIDER_SITE_OTHER): Payer: Medicare Other | Admitting: Urology

## 2022-03-27 ENCOUNTER — Encounter: Payer: Self-pay | Admitting: Urology

## 2022-03-27 ENCOUNTER — Other Ambulatory Visit: Payer: Self-pay | Admitting: Cardiovascular Disease

## 2022-03-27 ENCOUNTER — Ambulatory Visit
Admission: RE | Admit: 2022-03-27 | Discharge: 2022-03-27 | Disposition: A | Discharge status: Home / Self Care | Payer: Medicare Other | Source: Ambulatory Visit | Attending: Urology | Admitting: Urology

## 2022-03-27 VITALS — BP 112/65 | HR 99 | Ht 66.0 in | Wt 104.0 lb

## 2022-03-27 DIAGNOSIS — N390 Urinary tract infection, site not specified: Secondary | ICD-10-CM | POA: Diagnosis not present

## 2022-03-27 DIAGNOSIS — N200 Calculus of kidney: Secondary | ICD-10-CM | POA: Insufficient documentation

## 2022-03-27 LAB — URINALYSIS, COMPLETE
Bilirubin, UA: NEGATIVE
Glucose, UA: NEGATIVE
Ketones, UA: NEGATIVE
Nitrite, UA: NEGATIVE
Protein,UA: NEGATIVE
RBC, UA: NEGATIVE
Specific Gravity, UA: 1.015 (ref 1.005–1.030)
Urobilinogen, Ur: 1 mg/dL (ref 0.2–1.0)
pH, UA: 8.5 — ABNORMAL HIGH (ref 5.0–7.5)

## 2022-03-27 LAB — MICROSCOPIC EXAMINATION: Epithelial Cells (non renal): >10 /hpf — AB (ref 0–10)

## 2022-03-27 MED ORDER — ESTRADIOL 0.1 MG/GM VA CREA: TOPICAL_CREAM | VAGINAL | 1 refills | Status: AC

## 2022-03-27 NOTE — Progress Notes (Signed)
03/27/2022 1:06 PM   Karen Liu 12/30/1930 [id number]  Referring provider: Baxter Hire, MD [place],  [place] [address]  Chief Complaint  Patient presents with   Nephrolithiasis    HPI: 89 y.o. female presents for annual follow-up.  She presents today with her daughter.  She had COVID in late December and has had ongoing weakness and dehydration since that time Was seen in the ED 03/09/2022 and had a negative evaluation.  Urinalysis did show pyuria though urine culture had insignificant growth She had seen Dr. Jimmye Norman for intravenous therapy and was told she did have elevated white blood cells in her urine and was treated with antibiotics.  No culture documentation is available Denies flank, abdominal or pelvic pain   PMH: Past Medical History:  Diagnosis Date   Arthritis    neck   Brain tumor ([place])    CAD (coronary artery disease)    a. LHC 7/16: 50% mLAD not sig by FFR (0.9), mild LCx/RCA dz,EF & LVEDP nl; b. LHC 7/19: mLAD 40%, mLCx 30%, ostRCA 30%, mRCA 99% (3.0x26 Resolute DES), nl LVSF, mildly elevated LVEDP.   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   LEFT   Essential hypertension    GERD (gastroesophageal reflux disease)    History of CVA (cerebrovascular accident) 01/02/2011   Overview:  Left   History of kidney stones    Hyperlipidemia    Hypertension    Motion sickness    cars   PONV (postoperative nausea and vomiting)    Vertigo    before brain surgery    Surgical History: Past Surgical History:  Procedure Laterality Date   ABDOMINAL HYSTERECTOMY     APPENDECTOMY     BRAIN SURGERY  brain tumor removed 2013   BREAST BIOPSY Left 02/07/2022   Left Breast Stereo Bx, Coil Clip- path pending   BREAST BIOPSY Right 02/07/2022   MM RT BREAST BX W LOC DEV 1ST LESION IMAGE BX SPEC STEREO GUIDE 02/07/2022 [facility]   CARDIAC CATHETERIZATION N/A 09/21/2014   Procedure: Left Heart Cath and Coronary Angiography;  Surgeon: Wellington Hampshire, MD;  Location: [facility];  Service: Cardiovascular;  Laterality: N/A;   CATARACT EXTRACTION W/ INTRAOCULAR LENS  IMPLANT, BILATERAL     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   RESECTION CEREBELLAR MENINGIOMA   COLONOSCOPY     CORONARY STENT INTERVENTION N/A 09/21/2017   Procedure: CORONARY STENT INTERVENTION;  Surgeon: Wellington Hampshire, MD;  Location: [facility];  Service: Cardiovascular;  Laterality: N/A;   ESOPHAGOGASTRODUODENOSCOPY     ESOPHAGOGASTRODUODENOSCOPY (EGD) WITH PROPOFOL N/A 11/13/2014   Procedure: ESOPHAGOGASTRODUODENOSCOPY (EGD) WITH PROPOFOL;  Surgeon: Manya Silvas, MD;  Location: [facility];  Service: Endoscopy;  Laterality: N/A;   LEFT HEART CATH Right 09/21/2017   Procedure: Left Heart Cath;  Surgeon: Wellington Hampshire, MD;  Location: [place] [facility];  Service: Cardiovascular;  Laterality: Right;   MICROLARYNGOSCOPY N/A 11/22/2014   Procedure: MICROLARYNGOSCOPY DIRECT WITH REMOVAL EPIGLOTTIC CYST;  Surgeon: Beverly Gust, MD;  Location: [place];  Service: ENT;  Laterality: N/A;    Home Medications:  Allergies as of 03/27/2022       Reactions   Regadenoson    Severe chest pain, dyspnea and distress (prolonged) Severe chest pain, dyspnea and distress (prolonged)   Other Rash   Other reaction(s): HIVES   Phenazopyridine Rash   Pyridium [phenazopyridine Hcl] Rash   Sulfa Antibiotics Rash   Other reaction(s): HIVES  Sulfacetamide Sodium Rash   Other reaction(s): HIVES   Tamsulosin Nausea And Vomiting        Medication List        Accurate as of March 27, 2022  1:06 PM. If you have any questions, ask your nurse or doctor.          amLODipine 2.5 MG tablet Commonly known as: NORVASC TAKE 1 TABLET BY MOUTH EVERY DAY   aspirin EC 81 MG tablet Take 1 tablet by mouth every morning.   atorvastatin 40 MG tablet Commonly known as: LIPITOR TAKE 1 TABLET BY MOUTH DAILY AT 6 PM.   calcium-vitamin D  500-200 MG-UNIT tablet Commonly known as: OSCAL WITH D Take 2 tablets by mouth daily with breakfast.   cetirizine 10 MG chewable tablet Commonly known as: ZYRTEC Chew 10 mg by mouth daily as needed.   cyanocobalamin 500 MCG tablet Commonly known as: VITAMIN B12 Take 1,000 mcg by mouth every morning.   famotidine 40 MG tablet Commonly known as: PEPCID Take 40 mg by mouth daily.   fluticasone 50 MCG/ACT nasal spray Commonly known as: FLONASE Place 2 sprays into the nose 2 (two) times daily as needed.   nitroGLYCERIN 0.4 MG SL tablet Commonly known as: NITROSTAT Place 1 tablet (0.4 mg total) under the tongue every 5 (five) minutes as needed for chest pain.   pantoprazole 40 MG tablet Commonly known as: PROTONIX Take 40 mg by mouth daily.   potassium citrate 10 MEQ (1080 MG) SR tablet Commonly known as: UROCIT-K Take 1 tablet (10 mEq total) by mouth 2 (two) times daily.        Allergies:  Allergies  Allergen Reactions   Regadenoson     Severe chest pain, dyspnea and distress (prolonged) Severe chest pain, dyspnea and distress (prolonged)   Other Rash    Other reaction(s): HIVES    Phenazopyridine Rash   Pyridium [Phenazopyridine Hcl] Rash   Sulfa Antibiotics Rash    Other reaction(s): HIVES   Sulfacetamide Sodium Rash    Other reaction(s): HIVES   Tamsulosin Nausea And Vomiting    Family History: Family History  Problem Relation Age of Onset   Heart attack Mother 75   Heart disease Mother    Hypertension Mother    Hyperlipidemia Mother    Breast cancer Neg Hx     Social History:  reports that she has never smoked. She has never used smokeless tobacco. She reports that she does not drink alcohol and does not use drugs.   Physical Exam: See rooming tab for vitals Constitutional:  Alert, No acute distress. HEENT: [place] AT Respiratory: Normal respiratory effort, no increased work of breathing. GI: Abdomen is soft, nontender, nondistended, no abdominal  masses   Pertinent Imaging: Images of the KUB performed today were personally reviewed and interpreted.  Bilateral calcifications overlying the renal shadows identified without significant change from prior KUB  Assessment & Plan:    1.  Bilateral nephrolithiasis No evidence of ureteral calculi however with her symptoms of ongoing weakness we will schedule a renal stone protocol CT of the abdomen pelvis to evaluate for the possibility of ureteral calculi accounting for atypical symptoms  2.  Recurrent UTI No culture documentation UA today clear We discussed cranberry supplementation and low-dose vaginal estrogen.  She requested Rx low-dose vaginal estrogen and was instructed to use a pea-sized amount place in the introitus twice weekly Continue annual or prn follow-up   Abbie Sons, MD  [place] [phone]  [address] [place], [place] [address] [phone]

## 2022-04-11 ENCOUNTER — Ambulatory Visit
Admission: RE | Admit: 2022-04-11 | Discharge: 2022-04-11 | Disposition: A | Discharge status: Home / Self Care | Payer: Medicare Other | Source: Ambulatory Visit | Attending: Urology | Admitting: Urology

## 2022-04-11 DIAGNOSIS — N200 Calculus of kidney: Secondary | ICD-10-CM | POA: Insufficient documentation

## 2022-04-14 ENCOUNTER — Telehealth: Payer: Self-pay | Admitting: *Deleted

## 2022-04-14 NOTE — Telephone Encounter (Signed)
-----   Message from Abbie Sons, MD sent at 04/13/2022 10:47 AM EST ----- CT showed no ureteral calculi

## 2022-04-14 NOTE — Telephone Encounter (Signed)
Notified patient as instructed,.  

## 2022-04-18 ENCOUNTER — Ambulatory Visit: Payer: Medicare Other | Admitting: Urology

## 2022-04-24 ENCOUNTER — Other Ambulatory Visit: Payer: Self-pay | Admitting: Cardiovascular Disease

## 2022-04-29 ENCOUNTER — Ambulatory Visit: Payer: Medicare Other | Attending: Cardiovascular Disease | Admitting: Cardiovascular Disease

## 2022-04-29 ENCOUNTER — Encounter: Payer: Self-pay | Admitting: Cardiovascular Disease

## 2022-04-29 VITALS — BP 110/60 | HR 86 | Ht 66.0 in | Wt 104.1 lb

## 2022-04-29 DIAGNOSIS — R079 Chest pain, unspecified: Secondary | ICD-10-CM

## 2022-04-29 DIAGNOSIS — I2510 Atherosclerotic heart disease of native coronary artery without angina pectoris: Secondary | ICD-10-CM | POA: Insufficient documentation

## 2022-04-29 DIAGNOSIS — E782 Mixed hyperlipidemia: Secondary | ICD-10-CM

## 2022-04-29 DIAGNOSIS — I10 Essential (primary) hypertension: Secondary | ICD-10-CM | POA: Diagnosis not present

## 2022-04-29 NOTE — Patient Instructions (Signed)
Medication Instructions:  Your physician recommends that you continue on your current medications as directed. Please refer to the Current Medication list given to you today.  *If you need a refill on your cardiac medications before your next appointment, please call your pharmacy*   Lab Work: No labs ordered  If you have labs (blood work) drawn today and your tests are completely normal, you will receive your results only by: [place] (if you have MyChart) OR A paper copy in the mail If you have any lab test that is abnormal or we need to change your treatment, we will call you to review the results.   Testing/Procedures: No testing ordered  Follow-Up: At [facility], you and your health needs are our priority.  As part of our continuing mission to provide you with exceptional heart care, we have created designated Provider Care Teams.  These Care Teams include your primary Cardiologist (physician) and Advanced Practice Providers (APPs -  Physician Assistants and Nurse Practitioners) who all work together to provide you with the care you need, when you need it.  We recommend signing up for the patient portal called "MyChart".  Sign up information is provided on this After Visit Summary.  MyChart is used to connect with patients for Virtual Visits (Telemedicine).  Patients are able to view lab/test results, encounter notes, upcoming appointments, etc.  Non-urgent messages can be sent to your provider as well.   To learn more about what you can do with MyChart, go to [email or web address].    Your next appointment:   6 month(s)  Provider:   You may see Kathlyn Sacramento, MD or one of the following Advanced Practice Providers on your designated Care Team:   Murray Hodgkins, NP Christell Faith, PA-C Cadence Kathlen Mody, PA-C Gerrie Nordmann, NP

## 2022-04-29 NOTE — Progress Notes (Signed)
Cardiology Office Note   Date:  04/29/2022   ID:  Karen, Liu 11-05-1930, MRN [id number]  PCP:  Baxter Hire, MD  Cardiologist:   Kathlyn Sacramento, MD   Chief Complaint  Patient presents with   Other    6 month f/u no complaints today. Meds reviewed verbally with pt.      History of Present Illness: Karen Liu is a 89 y.o. female who presents for a follow-up visit regarding coronary artery disease. 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 She had unstable angina in July 2019. Cardiac catheterization showed 99% stenosis in the mid RCA and stable moderate LAD disease.  The RCA was treated successfully with PCI and drug-eluting stent placement. She had dizziness that was thought to be due to carvedilol which improved after switching back to amlodipine.   She had chest pain last year with negative cardiac workup.  Her symptoms were thought to be due to GERD and improved with Protonix.  She had COVID-19 infection in December and had significant weight loss since then but seems to be recovering now.  She denies chest pain or shortness of breath.  Past Medical History:  Diagnosis Date   Arthritis    neck   Brain tumor ([place])    CAD (coronary artery disease)    a. LHC 7/16: 50% mLAD not sig by FFR (0.9), mild LCx/RCA dz,EF & LVEDP nl; b. LHC 7/19: mLAD 40%, mLCx 30%, ostRCA 30%, mRCA 99% (3.0x26 Resolute DES), nl LVSF, mildly elevated LVEDP.   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   LEFT   Essential hypertension    GERD (gastroesophageal reflux disease)    History of CVA (cerebrovascular accident) 01/02/2011   Overview:  Left   History of kidney stones    Hyperlipidemia    Hypertension    Motion sickness    cars   PONV (postoperative nausea and vomiting)    Vertigo    before brain surgery    Past Surgical History:  Procedure Laterality Date   ABDOMINAL HYSTERECTOMY     APPENDECTOMY     BRAIN  SURGERY  brain tumor removed 2013   BREAST BIOPSY Left 02/07/2022   Left Breast Stereo Bx, Coil Clip- path pending   BREAST BIOPSY Right 02/07/2022   MM RT BREAST BX W LOC DEV 1ST LESION IMAGE BX SPEC STEREO GUIDE 02/07/2022 [facility]   CARDIAC CATHETERIZATION N/A 09/21/2014   Procedure: Left Heart Cath and Coronary Angiography;  Surgeon: Wellington Hampshire, MD;  Location: [place] [facility];  Service: Cardiovascular;  Laterality: N/A;   CATARACT EXTRACTION W/ INTRAOCULAR LENS  IMPLANT, BILATERAL     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   RESECTION CEREBELLAR MENINGIOMA   COLONOSCOPY     CORONARY STENT INTERVENTION N/A 09/21/2017   Procedure: CORONARY STENT INTERVENTION;  Surgeon: Wellington Hampshire, MD;  Location: [place] [facility];  Service: Cardiovascular;  Laterality: N/A;   ESOPHAGOGASTRODUODENOSCOPY     ESOPHAGOGASTRODUODENOSCOPY (EGD) WITH PROPOFOL N/A 11/13/2014   Procedure: ESOPHAGOGASTRODUODENOSCOPY (EGD) WITH PROPOFOL;  Surgeon: Manya Silvas, MD;  Location: [facility];  Service: Endoscopy;  Laterality: N/A;   LEFT HEART CATH Right 09/21/2017   Procedure: Left Heart Cath;  Surgeon: Wellington Hampshire, MD;  Location: [place] [facility];  Service: Cardiovascular;  Laterality: Right;   MICROLARYNGOSCOPY N/A 11/22/2014   Procedure: MICROLARYNGOSCOPY DIRECT WITH REMOVAL EPIGLOTTIC CYST;  Surgeon: Beverly Gust, MD;  Location: [place];  Service: ENT;  Laterality: N/A;     Current Outpatient Medications  Medication Sig Dispense Refill   amLODipine (NORVASC) 2.5 MG tablet TAKE 1 TABLET BY MOUTH EVERY DAY 90 tablet 0   aspirin EC 81 MG tablet Take 1 tablet by mouth every morning.     atorvastatin (LIPITOR) 40 MG tablet TAKE 1 TABLET BY MOUTH DAILY AT 6 PM. 90 tablet 0   calcium-vitamin D (OSCAL WITH D) 500-200 MG-UNIT tablet Take 2 tablets by mouth daily with breakfast.     cetirizine (ZYRTEC) 10 MG chewable tablet Chew 10 mg by mouth daily as needed.       estradiol (ESTRACE) 0.1 MG/GM vaginal cream Apply a pea-sized amount to fingertip and wipe in vaginal introitus twice weekly 42.5 g 1   fluticasone (FLONASE) 50 MCG/ACT nasal spray Place 2 sprays into the nose 2 (two) times daily as needed.     nitroGLYCERIN (NITROSTAT) 0.4 MG SL tablet Place 1 tablet (0.4 mg total) under the tongue every 5 (five) minutes as needed for chest pain. 25 tablet 3   pantoprazole (PROTONIX) 40 MG tablet Take 40 mg by mouth daily.     potassium citrate (UROCIT-K) 10 MEQ (1080 MG) SR tablet Take 1 tablet (10 mEq total) by mouth 2 (two) times daily. 180 tablet 3   vitamin B-12 (CYANOCOBALAMIN) 500 MCG tablet Take 1,000 mcg by mouth every morning.      famotidine (PEPCID) 40 MG tablet Take 40 mg by mouth daily. (Patient not taking: Reported on 04/29/2022)     No current facility-administered medications for this visit.    Allergies:   Regadenoson, Other, Phenazopyridine, Pyridium [phenazopyridine hcl], Sulfa antibiotics, Sulfacetamide sodium, and Tamsulosin    Social History:  The patient  reports that she has never smoked. She has never used smokeless tobacco. She reports that she does not drink alcohol and does not use drugs.   Family History:  The patient's family history includes Heart attack (age of onset: 54) in her mother; Heart disease in her mother; Hyperlipidemia in her mother; Hypertension in her mother.    ROS:  Please see the history of present illness.   Otherwise, review of systems are positive for none.   All other systems are reviewed and negative.    PHYSICAL EXAM: VS:  BP 110/60 (BP Location: Left Arm, Patient Position: Sitting, Cuff Size: Normal)   Pulse 86   Ht '5\' 6"'$  (1.676 m)   Wt 104 lb 2 oz (47.2 kg)   SpO2 97%   BMI 16.81 kg/m  , BMI Body mass index is 16.81 kg/m. GEN: Well nourished, well developed, in no acute distress  HEENT: normal  Neck: no JVD, carotid bruits, or masses Cardiac: RRR; no rubs, or gallops,no edema . 1/6 SEM in  aortic area.  Respiratory:  clear to auscultation bilaterally, normal work of breathing GI: soft, nontender, nondistended, + BS MS: no deformity or atrophy  Skin: warm and dry, no rash Neuro:  Strength and sensation are intact Psych: euthymic mood, full affect   EKG:  EKG is ordered today. The ekg ordered today demonstrates normal sinus rhythm with no significant ST or T wave changes.   Recent Labs: 03/08/2022: ALT 23; BUN 24; Creatinine, Ser 0.92; Hemoglobin 12.9; Platelets 386; Potassium 3.4; Sodium 137    Lipid Panel    Component Value Date/Time   CHOL 129 12/23/2017 0850   TRIG 52 12/23/2017 0850   HDL 56 12/23/2017 0850   CHOLHDL 2.3 12/23/2017 0850  LDLCALC 63 12/23/2017 0850   LDLDIRECT 80 11/11/2017 1156      Wt Readings from Last 3 Encounters:  04/29/22 104 lb 2 oz (47.2 kg)  03/27/22 104 lb (47.2 kg)  03/08/22 120 lb (54.4 kg)        ASSESSMENT AND PLAN:  1.  Coronary artery disease involving native coronary artery without angina: She is doing very well with no anginal symptoms.  Recommend continuing medical therapy.  Continue aspirin indefinitely.  2. Hyperlipidemia: I reviewed most recent lipid profile done in May with her primary care physician which showed an LDL of 69.  Continue atorvastatin 40 mg once daily.  3.  Essential hypertension: Blood pressure is well controlled on amlodipine.   Disposition:   FU with me in 6 months  Signed,  Kathlyn Sacramento, MD  04/29/2022 2:28 PM    [place]

## 2022-06-21 ENCOUNTER — Other Ambulatory Visit: Payer: Self-pay | Admitting: Cardiovascular Disease

## 2022-07-19 ENCOUNTER — Other Ambulatory Visit: Payer: Self-pay | Admitting: Cardiovascular Disease

## 2022-07-21 NOTE — Telephone Encounter (Signed)
Please schedule 6 month F/U. Thank you!

## 2022-09-20 ENCOUNTER — Other Ambulatory Visit: Payer: Self-pay | Admitting: Cardiovascular Disease

## 2022-10-13 ENCOUNTER — Other Ambulatory Visit: Payer: Self-pay | Admitting: Cardiovascular Disease

## 2022-10-13 NOTE — Telephone Encounter (Signed)
Hi,  Could you schedule this patient a 6 month follow up visit? Patient was last seen by Dr. Kirke Corin on 04-29-22. Thank you so much.

## 2022-12-04 ENCOUNTER — Ambulatory Visit: Payer: Medicare Other | Attending: Medical | Admitting: Medical

## 2022-12-04 ENCOUNTER — Encounter: Payer: Self-pay | Admitting: Medical

## 2022-12-04 VITALS — BP 102/60 | HR 74 | Ht 66.0 in | Wt 97.2 lb

## 2022-12-04 DIAGNOSIS — I10 Essential (primary) hypertension: Secondary | ICD-10-CM | POA: Diagnosis present

## 2022-12-04 DIAGNOSIS — I2510 Atherosclerotic heart disease of native coronary artery without angina pectoris: Secondary | ICD-10-CM | POA: Diagnosis present

## 2022-12-04 DIAGNOSIS — E782 Mixed hyperlipidemia: Secondary | ICD-10-CM | POA: Diagnosis present

## 2022-12-04 MED ORDER — AMLODIPINE BESYLATE 2.5 MG PO TABS: 2.5000 mg | ORAL_TABLET | Freq: Every day | ORAL | 3 refills | Status: DC

## 2022-12-04 MED ORDER — ATORVASTATIN CALCIUM 40 MG PO TABS: 40.0000 mg | ORAL_TABLET | Freq: Every day | ORAL | 3 refills | Status: AC

## 2022-12-04 NOTE — Patient Instructions (Signed)
Medication Instructions:  Your physician recommends that you continue on your current medications as directed. Please refer to the Current Medication list given to you today.   *If you need a refill on your cardiac medications before your next appointment, please call your pharmacy*   Lab Work: No labs ordered today    Testing/Procedures: No test ordered today    Follow-Up: At [facility], you and your health needs are our priority.  As part of our continuing mission to provide you with exceptional heart care, we have created designated Provider Care Teams.  These Care Teams include your primary Cardiologist (physician) and Advanced Practice Providers (APPs -  Physician Assistants and Nurse Practitioners) who all work together to provide you with the care you need, when you need it.  We recommend signing up for the patient portal called "MyChart".  Sign up information is provided on this After Visit Summary.  MyChart is used to connect with patients for Virtual Visits (Telemedicine).  Patients are able to view lab/test results, encounter notes, upcoming appointments, etc.  Non-urgent messages can be sent to your provider as well.   To learn more about what you can do with MyChart, go to [email or web address].    Your next appointment:   6 month(s)  Provider:   You may see Lorine Bears, MD or one of the following Advanced Practice Providers on your designated Care Team:   Nicolasa Ducking, NP Eula Listen, PA-C Cadence Fransico Michael, PA-C Charlsie Quest, NP

## 2022-12-04 NOTE — Progress Notes (Signed)
s Cardiology Office Note:    Date:  12/04/2022   ID:  Karen Liu, DOB 10/25/1930, MRN [id number]  PCP:  Gracelyn Nurse, MD  [facility] Cardiologist:  Lorine Bears, MD  [facility] Electrophysiologist:  None   Referring MD: Gracelyn Nurse, MD   Chief Complaint: 6 month follow-up  History of Present Illness:    Karen Liu is a 89 y.o. female with a hx of CAD s/p RCA stenting in 2019, stroke 2012, GERD, HTN, and HLD who presents for chest pain.  Patient had unstable angina in July 2019.  Cardiac cath showed 99% stenosis in the mid RCA and stable moderate LAD disease.  The RCA was treated successfully with PCI and drug-eluting stent placement.  Patient had dizziness with carvedilol and was placed on amlodipine.   The patient was admitted 10/2021 for angina. Troponin was negative and EKG was nonacute. Chest CTA with no PE. Plan was for outpatient work-up. The patient was seen in the office, but refused Myoview lexiscan.  Patient feels symptoms may be from GERD and improved with Protonix.   The patient was last seen 04/2022 and was stable from a cardiac perspective.   The patient reports she had COVID in 01/2022 and lost 15lbs. Weight was 120-125lbs prior to COVID. Weight today is 97lbs. She eats and drinks protein shakes. She is trying to get her weight up. She denies chest pain, SOB, LLE, orthopnea or pnd. She has rare dizziness and lightheadedness.   Past Medical History:  Diagnosis Date   Arthritis    neck   Brain tumor (HCC)    CAD (coronary artery disease)    a. LHC 7/16: 50% mLAD not sig by FFR (0.9), mild LCx/RCA dz,EF & LVEDP nl; b. LHC 7/19: mLAD 40%, mLCx 30%, ostRCA 30%, mRCA 99% (3.0x26 Resolute DES), nl LVSF, mildly elevated LVEDP.   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   LEFT   Essential hypertension    GERD (gastroesophageal reflux disease)    History of CVA (cerebrovascular accident) 01/02/2011   Overview:  Left   History of kidney stones     Hyperlipidemia    Hypertension    Motion sickness    cars   PONV (postoperative nausea and vomiting)    Vertigo    before brain surgery    Past Surgical History:  Procedure Laterality Date   ABDOMINAL HYSTERECTOMY     APPENDECTOMY     BRAIN SURGERY  brain tumor removed 2013   BREAST BIOPSY Left 02/07/2022   Left Breast Stereo Bx, Coil Clip- path pending   BREAST BIOPSY Right 02/07/2022   MM RT BREAST BX W LOC DEV 1ST LESION IMAGE BX SPEC STEREO GUIDE 02/07/2022 [facility]   CARDIAC CATHETERIZATION N/A 09/21/2014   Procedure: Left Heart Cath and Coronary Angiography;  Surgeon: Iran Ouch, MD;  Location: [facility] INVASIVE CV LAB;  Service: Cardiovascular;  Laterality: N/A;   CATARACT EXTRACTION W/ INTRAOCULAR LENS  IMPLANT, BILATERAL     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   RESECTION CEREBELLAR MENINGIOMA   COLONOSCOPY     CORONARY STENT INTERVENTION N/A 09/21/2017   Procedure: CORONARY STENT INTERVENTION;  Surgeon: Iran Ouch, MD;  Location: [facility] INVASIVE CV LAB;  Service: Cardiovascular;  Laterality: N/A;   ESOPHAGOGASTRODUODENOSCOPY     ESOPHAGOGASTRODUODENOSCOPY (EGD) WITH PROPOFOL N/A 11/13/2014   Procedure: ESOPHAGOGASTRODUODENOSCOPY (EGD) WITH PROPOFOL;  Surgeon: Scot Jun, MD;  Location: [facility] ENDOSCOPY;  Service: Endoscopy;  Laterality: N/A;   LEFT HEART CATH Right  09/21/2017   Procedure: Left Heart Cath;  Surgeon: Iran Ouch, MD;  Location: [facility] INVASIVE CV LAB;  Service: Cardiovascular;  Laterality: Right;   MICROLARYNGOSCOPY N/A 11/22/2014   Procedure: MICROLARYNGOSCOPY DIRECT WITH REMOVAL EPIGLOTTIC CYST;  Surgeon: Linus Salmons, MD;  Location: [facility];  Service: ENT;  Laterality: N/A;    Current Medications: Current Meds  Medication Sig   aspirin EC 81 MG tablet Take 1 tablet by mouth every morning.   estradiol (ESTRACE) 0.1 MG/GM vaginal cream Apply a pea-sized amount to fingertip and wipe in vaginal introitus twice weekly    pantoprazole (PROTONIX) 40 MG tablet Take 40 mg by mouth daily.   potassium citrate (UROCIT-K) 10 MEQ (1080 MG) SR tablet Take 1 tablet (10 mEq total) by mouth 2 (two) times daily.   [DISCONTINUED] amLODipine (NORVASC) 2.5 MG tablet TAKE 1 TABLET BY MOUTH EVERY DAY   [DISCONTINUED] atorvastatin (LIPITOR) 40 MG tablet Take 1 tablet (40 mg total) by mouth daily. PLEASE CALL OFFICE TO SCHEDULE APPOINTMENT PRIOR TO NEXT REFILL     Allergies:   Regadenoson, Other, Phenazopyridine, Pyridium [phenazopyridine hcl], Sulfa antibiotics, Sulfacetamide sodium, and Tamsulosin   Social History   Socioeconomic History   Marital status: Widowed    Spouse name: Not on file   Number of children: Not on file   Years of education: Not on file   Highest education level: Not on file  Occupational History   Not on file  Tobacco Use   Smoking status: Never   Smokeless tobacco: Never  Vaping Use   Vaping status: Never Used  Substance and Sexual Activity   Alcohol use: No   Drug use: No   Sexual activity: Not on file  Other Topics Concern   Not on file  Social History Narrative   Not on file   Social Determinants of Health   Financial Resource Strain: Not on file  Food Insecurity: Not on file  Transportation Needs: Not on file  Physical Activity: Not on file  Stress: Not on file  Social Connections: Not on file     Family History: The patient's family history includes Heart attack (age of onset: 68) in her mother; Heart disease in her mother; Hyperlipidemia in her mother; Hypertension in her mother. There is no history of Breast cancer.  ROS:   Please see the history of present illness.     All other systems reviewed and are negative.  EKGs/Labs/Other Studies Reviewed:    The following studies were reviewed today:  Echo 11/2021 1. Left ventricular ejection fraction, by estimation, is 55 to 60%. The  left ventricle has normal function. The left ventricle has no regional  wall motion  abnormalities. Left ventricular diastolic parameters are  indeterminate.   2. Right ventricular systolic function is normal. The right ventricular  size is normal.   3. The mitral valve is degenerative. Mild mitral valve regurgitation.   4. The aortic valve is tricuspid. Aortic valve regurgitation is not  visualized.   5. The inferior vena cava is normal in size with <50% respiratory  variability, suggesting right atrial pressure of 8 mmHg.    EKG:  EKG is  ordered today.  The ekg ordered today demonstrates NSR 74bpm, LAD, nonspecific T wave changes, septal q waves  Recent Labs: 03/08/2022: ALT 23; BUN 24; Creatinine, Ser 0.92; Hemoglobin 12.9; Platelets 386; Potassium 3.4; Sodium 137  Recent Lipid Panel    Component Value Date/Time   CHOL 129 12/23/2017 0850   TRIG  52 12/23/2017 0850   HDL 56 12/23/2017 0850   CHOLHDL 2.3 12/23/2017 0850   LDLCALC 63 12/23/2017 0850   LDLDIRECT 80 11/11/2017 1156    Physical Exam:    VS:  BP 102/60 (BP Location: Left Arm, Patient Position: Sitting, Cuff Size: Normal)   Pulse 74   Ht 5\' 6"  (1.676 m)   Wt 97 lb 3.2 oz (44.1 kg)   SpO2 93%   BMI 15.69 kg/m     Wt Readings from Last 3 Encounters:  12/04/22 97 lb 3.2 oz (44.1 kg)  04/29/22 104 lb 2 oz (47.2 kg)  03/27/22 104 lb (47.2 kg)     GEN:  Well nourished, well developed in no acute distress HEENT: Normal NECK: No JVD; No carotid bruits LYMPHATICS: No lymphadenopathy CARDIAC: RRR, no murmurs, rubs, gallops RESPIRATORY:  Clear to auscultation without rales, wheezing or rhonchi  ABDOMEN: Soft, non-tender, non-distended MUSCULOSKELETAL:  No edema; No deformity  SKIN: Warm and dry NEUROLOGIC:  Alert and oriented x 3 PSYCHIATRIC:  Normal affect   ASSESSMENT:    1. Coronary artery disease involving native coronary artery of native heart without angina pectoris   2. Hyperlipidemia, mixed   3. Essential hypertension    PLAN:    In order of problems listed above:  CAD s/p  PCI/DES mRCA in 2019 Patient denies anginal symptoms. Continue aspirin 81 mg, sublingual nitroglycerin, daily and Lipitor 40 mg daily. No ischemic workup indicated at this time.    HLD LDL 76 in May of this year.  Continue Lipitor 40 mg daily, I will refill this today.  HTN Blood pressure is soft today.  Patient reports rare lightheadedness and dizziness.  She takes amlodipine 2.5 mg daily, we will send in refills today.  I recommended patient monitor blood pressure at home.  Disposition: Follow up in 6 month(s) with MD/APP    Signed, Friend Dorfman David Stall, PA-C  12/04/2022 12:02 PM    [facility]

## 2023-02-04 ENCOUNTER — Telehealth: Payer: Self-pay | Admitting: Cardiovascular Disease

## 2023-02-04 NOTE — Telephone Encounter (Signed)
   Pt c/o of Chest Pain: STAT if active CP, including tightness, pressure, jaw pain, radiating pain to shoulder/upper arm/back, CP unrelieved by Nitro. Symptoms reported of SOB, nausea, vomiting, sweating.  1. Are you having CP right now? no    2. Are you experiencing any other symptoms (ex. SOB, nausea, vomiting, sweating)? sob   3. Is your CP continuous or coming and going? Comes and goes   4. Have you taken Nitroglycerin? 1 time last week    5. How long have you been experiencing CP? 2-3 months    6. If NO CP at time of call then end call with telling Pt to call back or call 911 if Chest pain returns prior to return call from triage team.

## 2023-02-04 NOTE — Telephone Encounter (Signed)
Patient identification verified by 2 forms. Marilynn Rail, RN    Received call from patients daughter Trey Paula states when patient woke up Sunday morning she had some severe chest pain and SOB  Robin requests RN call patient  _______________________________________________________  RN called and spoke to patient  Patient states:   -has SOB off and on   -SOB started 3-4 months ago   -SOB does not occur daily   -mainly occurs when "busy"  -when occurs, feels like she has to breather deeper   -does not have chest pain with SOB   -has not had any chest pain this week   -has history of acid reflux   -main concern is SOB  Patient denies:   -SOB at time of call  Patient scheduled for OV 12/5 at 10:05am  Reviewed ED warning signs/precautions  Patient verbalized understanding, no questions at this time

## 2023-02-04 NOTE — Telephone Encounter (Signed)
 Attempted to call patient, no answer left message requesting a call back.

## 2023-02-05 ENCOUNTER — Ambulatory Visit: Payer: Medicare Other | Attending: Student | Admitting: Student

## 2023-02-05 ENCOUNTER — Encounter: Payer: Self-pay | Admitting: Student

## 2023-02-05 VITALS — BP 100/58 | HR 74 | Ht 66.0 in | Wt 98.0 lb

## 2023-02-05 DIAGNOSIS — E785 Hyperlipidemia, unspecified: Secondary | ICD-10-CM | POA: Insufficient documentation

## 2023-02-05 DIAGNOSIS — I10 Essential (primary) hypertension: Secondary | ICD-10-CM | POA: Diagnosis not present

## 2023-02-05 DIAGNOSIS — I2510 Atherosclerotic heart disease of native coronary artery without angina pectoris: Secondary | ICD-10-CM | POA: Diagnosis present

## 2023-02-05 DIAGNOSIS — R0609 Other forms of dyspnea: Secondary | ICD-10-CM | POA: Diagnosis not present

## 2023-02-05 DIAGNOSIS — K219 Gastro-esophageal reflux disease without esophagitis: Secondary | ICD-10-CM | POA: Diagnosis present

## 2023-02-05 MED ORDER — NITROGLYCERIN 0.4 MG SL SUBL: 0.4000 mg | SUBLINGUAL_TABLET | SUBLINGUAL | 3 refills | Status: AC | PRN

## 2023-02-05 NOTE — Patient Instructions (Signed)
Medication Instructions:  No changes at this time.   *If you need a refill on your cardiac medications before your next appointment, please call your pharmacy*   Lab Work: None  If you have labs (blood work) drawn today and your tests are completely normal, you will receive your results only by: MyChart Message (if you have MyChart) OR A paper copy in the mail If you have any lab test that is abnormal or we need to change your treatment, we will call you to review the results.   Testing/Procedures: Your physician has requested that you have an echocardiogram. Echocardiography is a painless test that uses sound waves to create images of your heart. It provides your doctor with information about the size and shape of your heart and how well your heart's chambers and valves are working. This procedure takes approximately one hour. There are no restrictions for this procedure. Please do NOT wear cologne, perfume, aftershave, or lotions (deodorant is allowed). Please arrive 15 minutes prior to your appointment time.  Please note: We ask at that you not bring children with you during ultrasound (echo/ vascular) testing. Due to room size and safety concerns, children are not allowed in the ultrasound rooms during exams. Our front office staff cannot provide observation of children in our lobby area while testing is being conducted. An adult accompanying a patient to their appointment will only be allowed in the ultrasound room at the discretion of the ultrasound technician under special circumstances. We apologize for any inconvenience.    Follow-Up: At [facility], you and your health needs are our priority.  As part of our continuing mission to provide you with exceptional heart care, we have created designated Provider Care Teams.  These Care Teams include your primary Cardiologist (physician) and Advanced Practice Providers (APPs -  Physician Assistants and Nurse Practitioners) who  all work together to provide you with the care you need, when you need it.  We recommend signing up for the patient portal called "MyChart".  Sign up information is provided on this After Visit Summary.  MyChart is used to connect with patients for Virtual Visits (Telemedicine).  Patients are able to view lab/test results, encounter notes, upcoming appointments, etc.  Non-urgent messages can be sent to your provider as well.   To learn more about what you can do with MyChart, go to [email or web address].    Your next appointment:   June 02, 2023 at 11:20 am  Provider:   Terrilee Croak, PA-C

## 2023-02-05 NOTE — Progress Notes (Signed)
Cardiology Clinic Note   Date: 02/05/2023 ID: Karen Liu [birth year], MRN [id number]  Primary Cardiologist:  Lorine Bears, MD  Patient Profile    Karen Liu is a 89 y.o. female who presents to the clinic today for evaluation of DOE.     Past medical history significant for: CAD. LHC 09/21/2014 (abnormal stress test): Mid RCA 20%.  Mid LAD 50%.  Mid LCx 30%.  Moderate mid LAD stenosis not significant by pressure wire interrogation.  Recommend aggressive medical therapy.  Given patient's exertional symptoms and abnormal response to Lexiscan there is a possibility of microvascular angina and endothelial dysfunction. LHC 09/21/2017 (unstable angina): Mid LCx 30%.  Mid LAD 40%.  Mid RCA 99%.  PCI with DES 3.0 x 26 mm to mid RCA.  Normal LV systolic function and mildly elevated LVEDP. Echo 11/13/2021: EF 55 to 60%.  No RWMA.  Indeterminate diastolic parameters.  Normal RV size/function.  Mild MR. Hypertension. Hyperlipidemia. Lipid panel 01/16/2023: LDL 55, HDL 63, TG 56, total 129. Cerebral hemorrhage. November 2012. GERD.  In summary, patient has a history of CAD.  She underwent LHC July 2016 which showed nonobstructive CAD as detailed above.  In July 2019 she presented to [facility] with unstable angina.  Repeat LHC showed severe one-vessel coronary artery disease and she underwent PCI with DES to mid RCA.  Upon follow-up she complained of fatigue and intermittent dizziness attributed to carvedilol.  Carvedilol was discontinued and she was placed on amlodipine and atorvastatin was reduced secondary to concern it was contributing to fatigue.  In October 2019 she presented to the ED with chest pain that was felt to be due to GERD.  Troponin was negative.  She was placed on Protonix with resolution of her symptoms.  Patient underwent hospital admission August 2023 for angina.  Workup was unremarkable with negative troponin, nonacute EKG, no PE on chest CTA.  She followed up in the clinic and  reported being off acid reflux medications.  He started back on Protonix and Pepcid and denied further episodes of chest pain.  She declined further evaluation with Lexiscan as she had a bad experience in 2016.  She underwent echo which showed normal LV/RV function, no RWMA, mild MR.     History of Present Illness    Karen Liu is followed by Dr. Kirke Corin for the above outlined history.  Patient was last seen in the office by Cadence Furth, PA-C on 12/04/2022 for routine follow-up.  She was doing well from a cardiac perspective.  She reported a 15 pound weight loss after COVID infection in December 2023.  She she continued to work on gaining weight by eating and drinking protein shakes.  Patient contacted the office on 02/04/2023 with complaints of intermittent shortness of breath.  Per triage RN "Received call from patients daughter Karen Liu. Karen Liu states when patient woke up Sunday morning she had some severe chest pain and SOB. Spoke to patient  Patient states:              -has SOB off and on              -SOB started 3-4 months ago              -SOB does not occur daily              -mainly occurs when "busy"             -when occurs, feels like she has to breather  deeper              -does not have chest pain with SOB              -has not had any chest pain this week              -has history of acid reflux              -main concern is SOB  Patient denies:              -SOB at time of call  Patient scheduled for OV 12/5 at 10:05am  Reviewed ED warning signs/precautions  Patient verbalized understanding, no questions at this time."   Today, patient is here alone. She is somewhat of a poor historian. She reports feeling more short of breath with more strenuous activities around her home. She cannot specify which activities bring on dyspnea. The dyspnea does not cause her to stop her activities or rest. She lives alone in close proximity to her daughter and is independent with ALDs and  household activities. She denies shortness of breath with walking from the waiting room to the exam room today. No shortness of breath at rest, lower extremity edema, orthopnea or PND. When asked about chest pain on Sunday she states "that was my reflux." She states she will often have reflux related symptoms in the morning prior to taking medication. She feels this is well managed with her current regimen of famotidine and pantoprazole. She mentions prn NTG stating "I like to have those on hand in case I need it." She does not specify when she takes them only sharing the last time she took was >3 months ago. She has no other complaints.      ROS: All other systems reviewed and are otherwise negative except as noted in History of Present Illness.  Studies Reviewed    EKG Interpretation Date/Time:  Thursday February 05 2023 10:15:52 EST Ventricular Rate:  74 PR Interval:  144 QRS Duration:  72 QT Interval:  372 QTC Calculation: 412 R Axis:   -9  Text Interpretation: Normal sinus rhythm Possible Left atrial enlargement Septal infarct (cited on or before 09-Mar-2022) When compared with ECG of 04-Dec-2022 11:40, No significant change was found Confirmed by Carlos Levering [phone]) on 02/05/2023 10:21:20 AM           Physical Exam    VS:  BP (!) 100/58 (BP Location: Left Arm, Patient Position: Sitting, Cuff Size: Normal)   Pulse 74   Ht 5\' 6"  (1.676 m)   Wt 98 lb (44.5 kg)   SpO2 99%   BMI 15.82 kg/m  , BMI Body mass index is 15.82 kg/m.  GEN: Well nourished, well developed, in no acute distress. Neck: No JVD or carotid bruits. Cardiac:  RRR. No murmurs. No rubs or gallops.   Respiratory:  Respirations regular and unlabored. Clear to auscultation without rales, wheezing or rhonchi. GI: Soft, nontender, nondistended. Extremities: Radials/DP/PT 2+ and equal bilaterally. No clubbing or cyanosis. No edema.  Skin: Warm and dry, no rash. Neuro: Strength intact.  Assessment & Plan    DOE Patient reports increased dyspnea with more strenuous activities although she cannot specify which activities bother her. Dyspnea does not cause her to stop activities or rest. She lives alone (close to her daughter) and is independent with ADLs and household activities. No lower extremity edema, orthopnea or PND.  -Will schedule an echo for further evaluation.   CAD S/p PCI  with DES to mid RCA July 2019.  Echo September 2023 showed normal LV/RV function and, no RWMA, mild MR.  Patient denies chest pain. She does have significant reflux and typically has reflux symptoms first thing in the morning prior to taking medication. She is active and independent in her home.  She reports rare use of prn NTG and requests this to be renewed today. Last dose >3 months ago.  -Continue amlodipine, aspirin, atorvastatin, as needed SL NTG (will renew today).  Hyperlipidemia LDL November 2024 55, at goal. -Continue atorvastatin.  Hypertension BP today 100/58. No headaches or dizziness.  -Continue atorvastatin.  GERD This is a long standing problem for the patient. She reports reflux symptoms typically occur first thing in the morning before taking medication. She feels it is well controlled on current regimen.  -Continue pantoprazole and famotidine.   Disposition: Echo. Return for previously scheduled visit on 06/02/2023. If echo shows any abnormalities will bring patient back sooner.          Signed, Etta Grandchild. Daesean Lazarz, DNP, NP-C

## 2023-02-27 ENCOUNTER — Ambulatory Visit: Payer: Medicare Other | Attending: Student

## 2023-02-27 DIAGNOSIS — R0609 Other forms of dyspnea: Secondary | ICD-10-CM | POA: Insufficient documentation

## 2023-02-27 LAB — ECHOCARDIOGRAM COMPLETE
AR max vel: 2.1 cm2
AV Area VTI: 2.09 cm2
AV Area mean vel: 2.12 cm2
AV Mean grad: 2 mm[Hg]
AV Peak grad: 4.1 mm[Hg]
Ao pk vel: 1.02 m/s
Area-P 1/2: 5.13 cm2
S' Lateral: 2.6 cm
Single Plane A4C EF: 54.1 %

## 2023-04-03 NOTE — Progress Notes (Unsigned)
Cardiology Clinic Note   Date: 04/06/2023 ID: Karen Liu [birth year], MRN [id number]  Primary Cardiologist:  Karen Bears, MD  Chief Complaint   Karen Liu is a 89 y.o. female who presents to the clinic today for follow up after testing.   Patient Profile   Karen Liu is followed by Dr. Kirke Liu for the history outlined below.      Past medical history significant for: CAD. LHC 09/21/2014 (abnormal stress test): Mid RCA 20%.  Mid LAD 50%.  Mid LCx 30%.  Moderate mid LAD stenosis not significant by pressure wire interrogation.  Recommend aggressive medical therapy.  Given patient's exertional symptoms and abnormal response to Lexiscan there is a possibility of microvascular angina and endothelial dysfunction. LHC 09/21/2017 (unstable angina): Mid LCx 30%.  Mid LAD 40%.  Mid RCA 99%.  PCI with DES 3.0 x 26 mm to mid RCA.  Normal LV systolic function and mildly elevated LVEDP. Echo 02/27/2023: EF 60 to 65%.  No RWMA.  Grade II DD.  Normal RV size/function.  Moderately elevated PA pressure, RVSP 45.7 mmHg.  Moderate MR/TR.  Aortic valve sclerosis/calcification without stenosis. Hypertension. Hyperlipidemia. Lipid panel 01/16/2023: LDL 55, HDL 63, TG 56, total 129. Cerebral hemorrhage. November 2012. GERD.  In summary, patient has a history of CAD.  She underwent LHC July 2016 which showed nonobstructive CAD as detailed above.  In July 2019 she presented to [facility] with unstable angina.  Repeat LHC showed severe one-vessel coronary artery disease and she underwent PCI with DES to mid RCA.  Upon follow-up she complained of fatigue and intermittent dizziness attributed to carvedilol.  Carvedilol was discontinued and she was placed on amlodipine and atorvastatin was reduced secondary to concern it was contributing to fatigue.  In October 2019 she presented to the ED with chest pain that was felt to be due to GERD.  Troponin was negative.  She was placed on Protonix with resolution of her  symptoms.  Patient underwent hospital admission August 2023 for angina.  Workup was unremarkable with negative troponin, nonacute EKG, no PE on chest CTA.  She followed up in the clinic and reported being off acid reflux medications.  He started back on Protonix and Pepcid and denied further episodes of chest pain.  She declined further evaluation with Lexiscan as she had a bad experience in 2016.  She underwent echo which showed normal LV/RV function, no RWMA, mild MR.   Patient was last seen in the office by me on 02/05/2023 for evaluation of shortness of breath.  Patient was a difficult historian but reported shortness of breath with more strenuous activities around her home but could not specify which activities.  She stated dyspnea did not cause her to rest or stop activities.  She reported an episode of chest pain prior to visit when speaking with triage nurse.  At the time of her visit she felt that pain was related to reflux.  She reported taking as needed NTG greater than 3 months prior to visit.  Echo was performed which showed normal LV function as detailed above.     History of Present Illness    Today, patient is accompanied by her daughter. She reports stable dyspnea since her last visit. She does not find this particularly bothersome and is able to do all of her normal activities. Patient's daughter feels dyspnea was worse in the summer months. She denies chest pain, pressure or tightness. She has reflux that is typically well managed with  Protonix. She does have occasional breakthrough symptoms that respond to as needed Tums. Results of echo discussed in detail and all questions answered. Patient's daughter asks about continuing cholesterol medication.     ROS: All other systems reviewed and are otherwise negative except as noted in History of Present Illness.  EKGs/Labs Reviewed    EKG is not ordered today.   01/16/2023: Glucose 179, Sodium 141, potassium 3.9, creatinine 0.9, BUN 21,  WBC 5.2, hemoglobin 12      Physical Exam    VS:  BP 130/60   Pulse 82   Ht 5\' 6"  (1.676 m)   Wt 101 lb 6.4 oz (46 kg)   SpO2 92%   BMI 16.37 kg/m  , BMI Body mass index is 16.37 kg/m.  GEN: Well nourished, well developed, in no acute distress. Neck: No JVD or carotid bruits. Cardiac:  RRR. No murmurs. No rubs or gallops.   Respiratory:  Respirations regular and unlabored. Clear to auscultation without rales, wheezing or rhonchi. GI: Soft, nontender, nondistended. Extremities: Radials/DP/PT 2+ and equal bilaterally. No clubbing or cyanosis. No edema.  Skin: Warm and dry, no rash. Neuro: Strength intact.  Assessment & Plan   DOE Echo December 2024 showed normal LV/RV function, no RWMA, Grade II DD, moderately elevated PA pressure, moderate MR/TR.  Patient reports stable dyspnea with more strenuous activities. It is not really bothersome to her and has not stopped her from completing her normal activities. Patient's daughter feels she had more dyspnea over the summer in the hot, humid weather. She denies lower extremity edema, orthopnea or PND. Euvolemic and well compensated on exam.  -Continue to monitor symptoms. Contact office if symptoms worsen.    CAD S/p PCI with DES to mid RCA July 2019.  Echo September 2023 showed normal LV/RV function and, no RWMA, mild MR.  Patient denies chest pain*lower. She does have significant reflux and typically has reflux symptoms first thing in the morning prior to taking medication. She is active and independent in her home. -Continue amlodipine, aspirin, atorvastatin, as needed SL NTG.   Hyperlipidemia LDL November 2024 55, at goal. Patient's daughter asks if patient should continue atorvastatin. Discussed this is a decision for patient to make. They are going to consider it.  -Continue atorvastatin. Patient may decide to discontinue.    Hypertension BP today 130/60. No headaches or dizziness.  -Continue amlodipine.    GERD This is a long  standing problem for the patient. She reports reflux symptoms typically occur first thing in the morning before taking medication. She feels it is well controlled on current regimen. She does have some breakthrough symptoms well managed with Tums.  -Continue pantoprazole and as needed Tums.   Disposition: Return in 6 months or sooner as needed.          Signed, Etta Grandchild. Dayani Winbush, DNP, NP-C

## 2023-04-06 ENCOUNTER — Ambulatory Visit: Payer: Medicare Other | Attending: Student | Admitting: Student

## 2023-04-06 ENCOUNTER — Encounter: Payer: Self-pay | Admitting: Student

## 2023-04-06 VITALS — BP 130/60 | HR 82 | Ht 66.0 in | Wt 101.4 lb

## 2023-04-06 DIAGNOSIS — E785 Hyperlipidemia, unspecified: Secondary | ICD-10-CM | POA: Insufficient documentation

## 2023-04-06 DIAGNOSIS — I10 Essential (primary) hypertension: Secondary | ICD-10-CM | POA: Diagnosis not present

## 2023-04-06 DIAGNOSIS — R0609 Other forms of dyspnea: Secondary | ICD-10-CM | POA: Insufficient documentation

## 2023-04-06 DIAGNOSIS — I2510 Atherosclerotic heart disease of native coronary artery without angina pectoris: Secondary | ICD-10-CM | POA: Diagnosis not present

## 2023-04-06 NOTE — Patient Instructions (Signed)
Medication Instructions:   Your physician has recommended you make the following change in your medication:   *If you need a refill on your cardiac medications before your next appointment, please call your pharmacy*   Lab Work:  No labs ordered today  If you have labs (blood work) drawn today and your tests are completely normal, you will receive your results only by: MyChart Message (if you have MyChart) OR A paper copy in the mail If you have any lab test that is abnormal or we need to change your treatment, we will call you to review the results.   Testing/Procedures:  No test ordered today    Follow-Up: At [facility], you and your health needs are our priority.  As part of our continuing mission to provide you with exceptional heart care, we have created designated Provider Care Teams.  These Care Teams include your primary Cardiologist (physician) and Advanced Practice Providers (APPs -  Physician Assistants and Nurse Practitioners) who all work together to provide you with the care you need, when you need it.  We recommend signing up for the patient portal called "MyChart".  Sign up information is provided on this After Visit Summary.  MyChart is used to connect with patients for Virtual Visits (Telemedicine).  Patients are able to view lab/test results, encounter notes, upcoming appointments, etc.  Non-urgent messages can be sent to your provider as well.   To learn more about what you can do with MyChart, go to [email or web address].    Your next appointment:   6 month(s)  Provider:    You may see Lorine Bears, MD or one of the following Advanced Practice Providers on your designated Care Team:   Carlos Levering, NP

## 2023-06-02 ENCOUNTER — Ambulatory Visit: Payer: Medicare Other | Admitting: Medical

## 2023-09-10 ENCOUNTER — Ambulatory Visit: Admitting: Student

## 2023-10-10 NOTE — Progress Notes (Signed)
 Cardiology Clinic Note   Date: 10/14/2023 ID: Vaneta, [place] 1931-02-16, MRN [id number]  Primary Cardiologist:  Deatrice Cage, MD  Chief Complaint   Karen Liu is a 89 y.o. female who presents to the clinic today for routine follow up.   Patient Profile   Karen Liu is followed by Dr. Cage for the history outlined below.      Past medical history significant for: CAD. LHC 09/21/2014 (abnormal stress test): Mid RCA 20%.  Mid LAD 50%.  Mid LCx 30%.  Moderate mid LAD stenosis not significant by pressure wire interrogation.  Recommend aggressive medical therapy.  Given patient's exertional symptoms and abnormal response to Lexiscan  there is a possibility of microvascular angina and endothelial dysfunction. LHC 09/21/2017 (unstable angina): Mid LCx 30%.  Mid LAD 40%.  Mid RCA 99%.  PCI with DES 3.0 x 26 mm to mid RCA.  Normal LV systolic function and mildly elevated LVEDP. Echo 02/27/2023: EF 60 to 65%.  No RWMA.  Grade II DD.  Normal RV size/function.  Moderately elevated PA pressure, RVSP 45.7 mmHg.  Moderate MR/TR.  Aortic valve sclerosis/calcification without stenosis. Hypertension. Hyperlipidemia. Lipid panel 01/16/2023: LDL 55, HDL 63, TG 56, total 129. Cerebral hemorrhage. November 2012. GERD.  In summary, patient has a history of CAD.  She underwent LHC July 2016 which showed nonobstructive CAD as detailed above.  In July 2019 she presented to [facility] with unstable angina.  Repeat LHC showed severe one-vessel coronary artery disease and she underwent PCI with DES to mid RCA.  Upon follow-up she complained of fatigue and intermittent dizziness attributed to carvedilol .  Carvedilol  was discontinued and she was placed on amlodipine  and atorvastatin  was reduced secondary to concern it was contributing to fatigue.  In October 2019 she presented to the ED with chest pain that was felt to be due to GERD.  Troponin was negative.  She was placed on Protonix  with resolution of her  symptoms.  Patient underwent hospital admission August 2023 for angina.  Workup was unremarkable with negative troponin, nonacute EKG, no PE on chest CTA.  She followed up in the clinic and reported being off acid reflux medications.  He started back on Protonix  and Pepcid  and denied further episodes of chest pain.  She declined further evaluation with Lexiscan  as she had a bad experience in 2016.  She underwent echo which showed normal LV/RV function, no RWMA, mild MR.   Patient was seen in the office on 02/05/2023 for evaluation of shortness of breath.  Patient was a difficult historian but reported shortness of breath with more strenuous activities around her home but could not specify which activities.  She stated dyspnea did not cause her to rest or stop activities.  She reported an episode of chest pain prior to visit when speaking with triage nurse.  At the time of her visit she felt that pain was related to reflux.  She reported taking as needed NTG greater than 3 months prior to visit.  Echo was performed which showed normal LV function as detailed above.   Patient was last seen in the office by me on 04/06/2023 for follow-up after testing.  She reported stable dyspnea was able to do all of her normal activities.  Patient's daughter inquired about continuing atorvastatin .  Given patient's age I recommended discussing this with family and deciding if they wanted to discontinue.     History of Present Illness    Today, patient is here alone. Patient denies  shortness of breath, dyspnea on exertion, lower extremity edema, orthopnea or PND. No chest pain, pressure, or tightness. No palpitations.  She walks a lot for exercise. She and her sister will go the stores to walk around. She also walks up and down her driveway. She lives independently and continues to drive short distances. She reports she eats a lot.      ROS: All other systems reviewed and are otherwise negative except as noted in History  of Present Illness.  EKGs/Labs Reviewed    EKG Interpretation Date/Time:  Wednesday October 14 2023 10:24:00 EDT Ventricular Rate:  69 PR Interval:  138 QRS Duration:  84 QT Interval:  384 QTC Calculation: 411 R Axis:   -40  Text Interpretation: Normal sinus rhythm Possible Left atrial enlargement Left axis deviation Septal infarct (cited on or before 09-Mar-2022) When compared with ECG of 05-Feb-2023 10:15, No significant change Confirmed by Loistine Sober [phone]) on 10/14/2023 10:27:00 AM    Physical Exam    VS:  BP (!) 100/50 (BP Location: Left Arm, Patient Position: Sitting, Cuff Size: Small)   Pulse 69   Ht 5' 6 (1.676 m)   Wt 95 lb (43.1 kg)   SpO2 93%   BMI 15.33 kg/m  , BMI Body mass index is 15.33 kg/m.  GEN: Well nourished, well developed, in no acute distress. Neck: No JVD or carotid bruits. Cardiac:  RRR.  No murmur. No rubs or gallops.   Respiratory:  Respirations regular and unlabored. Clear to auscultation without rales, wheezing or rhonchi. GI: Soft, nontender, nondistended. Extremities: Radials/DP/PT 2+ and equal bilaterally. No clubbing or cyanosis. Mild edema bilateral ankles.   Skin: Warm and dry, no rash. Neuro: Strength intact.  Assessment & Plan   CAD S/p PCI with DES to mid RCA July 2019.  Echo December 2024 showed normal LV/RV function, no RWMA, Grade II DD, moderately elevated PA pressure, moderate MR/TR. Patient denies chest pain, pressure or tightness. She walks for exercise.  - Continue amlodipine , aspirin , atorvastatin , as needed SL NTG.   Hyperlipidemia LDL 55 November 2024, at goal.  -Continue atorvastatin .    Hypertension BP today 100/50. No headaches or dizziness.  -Continue amlodipine .    GERD This is a long standing problem for the patient. She reports reflux symptoms typically occur first thing in the morning before taking medication. She feels it is well controlled on current regimen. She does have some breakthrough symptoms  well managed with Tums. -Continue pantoprazole  and as needed Tums.   Disposition: Return in 6 months or sooner as needed.          Signed, Sober HERO. Ariza Evans, DNP, NP-C

## 2023-10-14 ENCOUNTER — Encounter: Payer: Self-pay | Admitting: Student

## 2023-10-14 ENCOUNTER — Ambulatory Visit: Attending: Student | Admitting: Student

## 2023-10-14 VITALS — BP 100/50 | HR 69 | Ht 66.0 in | Wt 95.0 lb

## 2023-10-14 DIAGNOSIS — I10 Essential (primary) hypertension: Secondary | ICD-10-CM | POA: Diagnosis present

## 2023-10-14 DIAGNOSIS — E785 Hyperlipidemia, unspecified: Secondary | ICD-10-CM | POA: Diagnosis not present

## 2023-10-14 DIAGNOSIS — K219 Gastro-esophageal reflux disease without esophagitis: Secondary | ICD-10-CM | POA: Diagnosis present

## 2023-10-14 DIAGNOSIS — I2510 Atherosclerotic heart disease of native coronary artery without angina pectoris: Secondary | ICD-10-CM | POA: Diagnosis not present

## 2023-10-14 NOTE — Patient Instructions (Signed)
 Medication Instructions:  Your physician recommends that you continue on your current medications as directed. Please refer to the Current Medication list given to you today.   *If you need a refill on your cardiac medications before your next appointment, please call your pharmacy*  Lab Work: None ordered at this time  If you have labs (blood work) drawn today and your tests are completely normal, you will receive your results only by: MyChart Message (if you have MyChart) OR A paper copy in the mail If you have any lab test that is abnormal or we need to change your treatment, we will call you to review the results.  Testing/Procedures: None ordered at this time   Follow-Up: At [facility], you and your health needs are our priority.  As part of our continuing mission to provide you with exceptional heart care, our providers are all part of one team.  This team includes your primary Cardiologist (physician) and Advanced Practice Providers or APPs (Physician Assistants and Nurse Practitioners) who all work together to provide you with the care you need, when you need it.  Your next appointment:   6 month(s)  Provider:   Deatrice Cage, MD or Barnie Hila, NP    We recommend signing up for the patient portal called MyChart.  Sign up information is provided on this After Visit Summary.  MyChart is used to connect with patients for Virtual Visits (Telemedicine).  Patients are able to view lab/test results, encounter notes, upcoming appointments, etc.  Non-urgent messages can be sent to your provider as well.   To learn more about what you can do with MyChart, go to [email or web address].

## 2024-01-28 ENCOUNTER — Other Ambulatory Visit: Payer: Self-pay | Admitting: Medical

## 2024-01-28 DIAGNOSIS — I2510 Atherosclerotic heart disease of native coronary artery without angina pectoris: Secondary | ICD-10-CM

## 2024-01-28 DIAGNOSIS — I10 Essential (primary) hypertension: Secondary | ICD-10-CM

## 2024-01-28 DIAGNOSIS — E785 Hyperlipidemia, unspecified: Secondary | ICD-10-CM
